# Patient Record
Sex: Male | Born: 1955 | Race: Black or African American | Hispanic: No | Marital: Married | Smoking: Never smoker
Health system: Southern US, Community
[De-identification: ages and names within clinical notes are randomized; demographics above are authoritative.]

## PROBLEM LIST (undated history)

## (undated) DIAGNOSIS — C119 Malignant neoplasm of nasopharynx, unspecified: Secondary | ICD-10-CM

## (undated) DIAGNOSIS — E785 Hyperlipidemia, unspecified: Secondary | ICD-10-CM

## (undated) DIAGNOSIS — N189 Chronic kidney disease, unspecified: Secondary | ICD-10-CM

## (undated) DIAGNOSIS — I10 Essential (primary) hypertension: Secondary | ICD-10-CM

## (undated) DIAGNOSIS — G4730 Sleep apnea, unspecified: Secondary | ICD-10-CM

## (undated) DIAGNOSIS — R079 Chest pain, unspecified: Secondary | ICD-10-CM

## (undated) DIAGNOSIS — Z87442 Personal history of urinary calculi: Secondary | ICD-10-CM

## (undated) DIAGNOSIS — J189 Pneumonia, unspecified organism: Secondary | ICD-10-CM

## (undated) HISTORY — DX: Hyperlipidemia, unspecified: E78.5

## (undated) HISTORY — PX: ROTATOR CUFF REPAIR: SHX139

## (undated) HISTORY — PX: HERNIA REPAIR: SHX51

## (undated) HISTORY — PX: FINE NEEDLE ASPIRATION BIOPSY: CATH118315

## (undated) HISTORY — DX: Chronic kidney disease, unspecified: N18.9

## (undated) HISTORY — PX: OTHER SURGICAL HISTORY: SHX169

---

## 2004-03-02 ENCOUNTER — Emergency Department (HOSPITAL_COMMUNITY): Admission: EM | Admit: 2004-03-02 | Discharge: 2004-03-02 | Payer: Self-pay | Admitting: Emergency Medicine

## 2004-06-24 ENCOUNTER — Ambulatory Visit (HOSPITAL_COMMUNITY): Admission: RE | Admit: 2004-06-24 | Discharge: 2004-06-24 | Payer: Self-pay | Admitting: Orthopaedic Surgery

## 2004-06-24 ENCOUNTER — Ambulatory Visit (HOSPITAL_BASED_OUTPATIENT_CLINIC_OR_DEPARTMENT_OTHER): Admission: RE | Admit: 2004-06-24 | Discharge: 2004-06-24 | Payer: Self-pay | Admitting: Orthopaedic Surgery

## 2005-09-22 ENCOUNTER — Observation Stay (HOSPITAL_COMMUNITY): Admission: EM | Admit: 2005-09-22 | Discharge: 2005-09-23 | Payer: Self-pay | Admitting: Emergency Medicine

## 2005-09-22 ENCOUNTER — Ambulatory Visit: Payer: Self-pay | Admitting: Internal Medicine

## 2005-09-24 ENCOUNTER — Ambulatory Visit: Payer: Self-pay

## 2006-03-20 ENCOUNTER — Emergency Department (HOSPITAL_COMMUNITY): Admission: EM | Admit: 2006-03-20 | Discharge: 2006-03-21 | Payer: Self-pay | Admitting: Emergency Medicine

## 2010-11-05 ENCOUNTER — Emergency Department (HOSPITAL_COMMUNITY)
Admission: EM | Admit: 2010-11-05 | Discharge: 2010-11-05 | Disposition: A | Discharge status: Home / Self Care | Payer: Worker's Compensation | Attending: Emergency Medicine | Admitting: Emergency Medicine

## 2010-11-05 DIAGNOSIS — Y92410 Unspecified street and highway as the place of occurrence of the external cause: Secondary | ICD-10-CM | POA: Insufficient documentation

## 2010-11-05 DIAGNOSIS — I10 Essential (primary) hypertension: Secondary | ICD-10-CM | POA: Insufficient documentation

## 2010-11-05 DIAGNOSIS — M545 Low back pain, unspecified: Secondary | ICD-10-CM | POA: Insufficient documentation

## 2010-11-05 DIAGNOSIS — E7800 Pure hypercholesterolemia, unspecified: Secondary | ICD-10-CM | POA: Insufficient documentation

## 2010-11-05 DIAGNOSIS — T1490XA Injury, unspecified, initial encounter: Secondary | ICD-10-CM | POA: Insufficient documentation

## 2011-12-04 ENCOUNTER — Telehealth: Payer: Self-pay

## 2011-12-04 NOTE — Telephone Encounter (Signed)
OPEN IN ERROR 

## 2015-09-09 ENCOUNTER — Other Ambulatory Visit: Payer: Self-pay | Admitting: Orthopedic Surgery

## 2015-09-09 ENCOUNTER — Ambulatory Visit
Admission: RE | Admit: 2015-09-09 | Discharge: 2015-09-09 | Disposition: A | Discharge status: Home / Self Care | Payer: Commercial Managed Care - HMO | Source: Ambulatory Visit | Attending: Orthopedic Surgery | Admitting: Orthopedic Surgery

## 2015-09-09 DIAGNOSIS — Z09 Encounter for follow-up examination after completed treatment for conditions other than malignant neoplasm: Secondary | ICD-10-CM

## 2015-11-18 ENCOUNTER — Encounter (HOSPITAL_COMMUNITY): Payer: Self-pay | Admitting: Emergency Medicine

## 2015-11-18 DIAGNOSIS — N23 Unspecified renal colic: Secondary | ICD-10-CM | POA: Diagnosis not present

## 2015-11-18 DIAGNOSIS — R300 Dysuria: Secondary | ICD-10-CM | POA: Insufficient documentation

## 2015-11-18 DIAGNOSIS — R112 Nausea with vomiting, unspecified: Secondary | ICD-10-CM | POA: Diagnosis not present

## 2015-11-18 DIAGNOSIS — R1032 Left lower quadrant pain: Secondary | ICD-10-CM | POA: Diagnosis present

## 2015-11-18 DIAGNOSIS — I10 Essential (primary) hypertension: Secondary | ICD-10-CM | POA: Diagnosis not present

## 2015-11-18 LAB — URINALYSIS, ROUTINE W REFLEX MICROSCOPIC
BILIRUBIN URINE: NEGATIVE
Glucose, UA: NEGATIVE mg/dL
KETONES UR: NEGATIVE mg/dL
NITRITE: NEGATIVE
PROTEIN: 30 mg/dL — AB
Specific Gravity, Urine: 1.022 (ref 1.005–1.030)
pH: 5.5 (ref 5.0–8.0)

## 2015-11-18 LAB — CBC
HEMATOCRIT: 48 % (ref 39.0–52.0)
Hemoglobin: 16 g/dL (ref 13.0–17.0)
MCH: 30.1 pg (ref 26.0–34.0)
MCHC: 33.3 g/dL (ref 30.0–36.0)
MCV: 90.2 fL (ref 78.0–100.0)
PLATELETS: 218 10*3/uL (ref 150–400)
RBC: 5.32 MIL/uL (ref 4.22–5.81)
RDW: 15.3 % (ref 11.5–15.5)
WBC: 8.6 10*3/uL (ref 4.0–10.5)

## 2015-11-18 LAB — COMPREHENSIVE METABOLIC PANEL
ALBUMIN: 4 g/dL (ref 3.5–5.0)
ALT: 24 U/L (ref 17–63)
AST: 20 U/L (ref 15–41)
Alkaline Phosphatase: 71 U/L (ref 38–126)
Anion gap: 8 (ref 5–15)
BILIRUBIN TOTAL: 1.2 mg/dL (ref 0.3–1.2)
BUN: 18 mg/dL (ref 6–20)
CALCIUM: 9.2 mg/dL (ref 8.9–10.3)
CO2: 25 mmol/L (ref 22–32)
Chloride: 103 mmol/L (ref 101–111)
Creatinine, Ser: 2.2 mg/dL — ABNORMAL HIGH (ref 0.61–1.24)
GFR calc Af Amer: 36 mL/min — ABNORMAL LOW (ref >60)
GFR, EST NON AFRICAN AMERICAN: 31 mL/min — AB (ref >60)
GLUCOSE: 134 mg/dL — AB (ref 65–99)
POTASSIUM: 3.6 mmol/L (ref 3.5–5.1)
Sodium: 136 mmol/L (ref 135–145)
TOTAL PROTEIN: 7.4 g/dL (ref 6.5–8.1)

## 2015-11-18 LAB — URINE MICROSCOPIC-ADD ON

## 2015-11-18 LAB — LIPASE, BLOOD: Lipase: 24 U/L (ref 11–51)

## 2015-11-18 NOTE — ED Notes (Signed)
Pt. reports LLQ pain with emesis onset 2 days ago , denies diarrhea , no fever or chills.

## 2015-11-19 ENCOUNTER — Emergency Department (HOSPITAL_COMMUNITY)
Admission: EM | Admit: 2015-11-19 | Discharge: 2015-11-19 | Disposition: A | Discharge status: Home / Self Care | Payer: Commercial Managed Care - HMO | Attending: Emergency Medicine | Admitting: Emergency Medicine

## 2015-11-19 ENCOUNTER — Emergency Department (HOSPITAL_COMMUNITY): Payer: Commercial Managed Care - HMO

## 2015-11-19 DIAGNOSIS — N23 Unspecified renal colic: Secondary | ICD-10-CM

## 2015-11-19 MED ORDER — HYDROCODONE-ACETAMINOPHEN 5-325 MG PO TABS: 1.0000 | ORAL_TABLET | ORAL | Status: DC | PRN

## 2015-11-19 MED ORDER — SODIUM CHLORIDE 0.9 % IV BOLUS (SEPSIS)
1000.0000 mL | Freq: Once | INTRAVENOUS | Status: AC
  Administered 2015-11-19: 1000 mL via INTRAVENOUS

## 2015-11-19 MED ORDER — TAMSULOSIN HCL 0.4 MG PO CAPS: 0.4000 mg | ORAL_CAPSULE | Freq: Every day | ORAL | Status: DC

## 2015-11-19 MED ORDER — HYDROCODONE-ACETAMINOPHEN 5-325 MG PO TABS
1.0000 | ORAL_TABLET | Freq: Once | ORAL | Status: AC
  Administered 2015-11-19: 1 via ORAL
  Filled 2015-11-19: qty 1

## 2015-11-19 MED ORDER — KETOROLAC TROMETHAMINE 15 MG/ML IJ SOLN
15.0000 mg | Freq: Once | INTRAMUSCULAR | Status: AC
  Administered 2015-11-19: 15 mg via INTRAVENOUS
  Filled 2015-11-19: qty 1

## 2015-11-19 NOTE — ED Notes (Signed)
MD at bedside. 

## 2015-11-19 NOTE — Discharge Instructions (Signed)
You have a 3 mm stone in your left ureter.  Drink plenty of fluids.  Get rechecked immediately if you develop fevers, uncontrolled pain, or new worrisome symptoms.     Kidney Stones Kidney stones (urolithiasis) are deposits that form inside your kidneys. The intense pain is caused by the stone moving through the urinary tract. When the stone moves, the ureter goes into spasm around the stone. The stone is usually passed in the urine.  CAUSES   A disorder that makes certain neck glands produce too much parathyroid hormone (primary hyperparathyroidism).  A buildup of uric acid crystals, similar to gout in your joints.  Narrowing (stricture) of the ureter.  A kidney obstruction present at birth (congenital obstruction).  Previous surgery on the kidney or ureters.  Numerous kidney infections. SYMPTOMS   Feeling sick to your stomach (nauseous).  Throwing up (vomiting).  Blood in the urine (hematuria).  Pain that usually spreads (radiates) to the groin.  Frequency or urgency of urination. DIAGNOSIS   Taking a history and physical exam.  Blood or urine tests.  CT scan.  Occasionally, an examination of the inside of the urinary bladder (cystoscopy) is performed. TREATMENT   Observation.  Increasing your fluid intake.  Extracorporeal shock wave lithotripsy--This is a noninvasive procedure that uses shock waves to break up kidney stones.  Surgery may be needed if you have severe pain or persistent obstruction. There are various surgical procedures. Most of the procedures are performed with the use of small instruments. Only small incisions are needed to accommodate these instruments, so recovery time is minimized. The size, location, and chemical composition are all important variables that will determine the proper choice of action for you. Talk to your health care provider to better understand your situation so that you will minimize the risk of injury to yourself and your  kidney.  HOME CARE INSTRUCTIONS   Drink enough water and fluids to keep your urine clear or pale yellow. This will help you to pass the stone or stone fragments.  Strain all urine through the provided strainer. Keep all particulate matter and stones for your health care provider to see. The stone causing the pain may be as small as a grain of salt. It is very important to use the strainer each and every time you pass your urine. The collection of your stone will allow your health care provider to analyze it and verify that a stone has actually passed. The stone analysis will often identify what you can do to reduce the incidence of recurrences.  Only take over-the-counter or prescription medicines for pain, discomfort, or fever as directed by your health care provider.  Keep all follow-up visits as told by your health care provider. This is important.  Get follow-up X-rays if required. The absence of pain does not always mean that the stone has passed. It may have only stopped moving. If the urine remains completely obstructed, it can cause loss of kidney function or even complete destruction of the kidney. It is your responsibility to make sure X-rays and follow-ups are completed. Ultrasounds of the kidney can show blockages and the status of the kidney. Ultrasounds are not associated with any radiation and can be performed easily in a matter of minutes.  Make changes to your daily diet as told by your health care provider. You may be told to:  Limit the amount of salt that you eat.  Eat 5 or more servings of fruits and vegetables each day.  Limit  the amount of meat, poultry, fish, and eggs that you eat.  Collect a 24-hour urine sample as told by your health care provider.You may need to collect another urine sample every 6-12 months. SEEK MEDICAL CARE IF:  You experience pain that is progressive and unresponsive to any pain medicine you have been prescribed. SEEK IMMEDIATE MEDICAL CARE  IF:   Pain cannot be controlled with the prescribed medicine.  You have a fever or shaking chills.  The severity or intensity of pain increases over 18 hours and is not relieved by pain medicine.  You develop a new onset of abdominal pain.  You feel faint or pass out.  You are unable to urinate.   This information is not intended to replace advice given to you by your health care provider. Make sure you discuss any questions you have with your health care provider.   Document Released: 06/22/2005 Document Revised: 03/13/2015 Document Reviewed: 11/23/2012 Elsevier Interactive Patient Education [employer].

## 2015-11-19 NOTE — ED Provider Notes (Signed)
CSN: [id number]     Arrival date & time 11/18/15  2115 History  By signing my name below, I, Julien Nordmann, attest that this documentation has been prepared under the direction and in the presence of Quintella Reichert, MD. Electronically Signed: Julien Nordmann, ED Scribe. 11/19/2015. 1:15 AM.    Chief Complaint  Patient presents with  . Abdominal Pain     The history is provided by the patient. No language interpreter was used.   HPI Comments: Alan Henry is a 60 y.o. male who has a PMHx of HTN and overactive bladder presents to the Emergency Department complaining of constant, gradual worsening, moderate, throbbing LLQ abdominal pain onset two days ago. Pt has been having associated dysuria, nausea, loss of appetite and one episode of vomiting. He says he has had similar pain two weeks ago and was seen by his PCP where he was given a Toradol shot to alleviate his pain with relief. Pt reports severe pain with any kind of movement. He has taken Tylenol to alleviate his pain with no relief. He reports having a hx of kidney infections and he notes this current pain feels similar but is giving him more pain. Pt denies fever, hx of kidney stones, and diarrhea.    PCP: Dr. Jeanie Cooks History reviewed. No pertinent past medical history. History reviewed. No pertinent past surgical history. No family history on file. Social History  Substance Use Topics  . Smoking status: Never Smoker   . Smokeless tobacco: None  . Alcohol Use: No    Review of Systems  Constitutional: Positive for appetite change. Negative for fever.  Gastrointestinal: Positive for nausea, vomiting and abdominal pain (LLQ). Negative for diarrhea.  Genitourinary: Positive for dysuria.  All other systems reviewed and are negative.     Allergies  Review of patient's allergies indicates no known allergies.  Home Medications   Prior to Admission medications   Medication Sig Start Date End Date Taking? Authorizing Provider   HYDROcodone-acetaminophen (NORCO/VICODIN) 5-325 MG tablet Take 1 tablet by mouth every 4 (four) hours as needed. 11/19/15   Quintella Reichert, MD  tamsulosin (FLOMAX) 0.4 MG CAPS capsule Take 1 capsule (0.4 mg total) by mouth daily. 11/19/15   Quintella Reichert, MD   Triage vitals: BP 165/106 mmHg  Pulse 59  Temp(Src) 99.4 F (37.4 C) (Oral)  Resp 16  SpO2 99% Physical Exam  Constitutional: He is oriented to person, place, and time. He appears well-developed and well-nourished.  HENT:  Head: Normocephalic and atraumatic.  Cardiovascular: Normal rate and regular rhythm.   No murmur heard. Pulmonary/Chest: Effort normal and breath sounds normal. No respiratory distress.  Abdominal: Soft. There is tenderness. There is no rebound and no guarding.  Mild diffuse abdominal tenderness with mild LLQ tenderness  Musculoskeletal: He exhibits no edema or tenderness.  Neurological: He is alert and oriented to person, place, and time.  Skin: Skin is warm and dry.  Psychiatric: He has a normal mood and affect. His behavior is normal.  Nursing note and vitals reviewed.   ED Course  Procedures  DIAGNOSTIC STUDIES: Oxygen Saturation is 99% on RA, normal by my interpretation.  COORDINATION OF CARE:  1:13 AM Will order CT of abdomen. Discussed treatment plan which includes IV fluids and pain medication with pt at bedside and pt agreed to plan.  2:34 AM Discussed with the urologist on call. Pt will follow up with them in the future.    Labs Review Labs Reviewed  COMPREHENSIVE METABOLIC PANEL - Abnormal;  Notable for the following:    Glucose, Bld 134 (*)    Creatinine, Ser 2.20 (*)    GFR calc non Af Amer 31 (*)    GFR calc Af Amer 36 (*)    All other components within normal limits  URINALYSIS, ROUTINE W REFLEX MICROSCOPIC (NOT AT [facility]) - Abnormal; Notable for the following:    APPearance CLOUDY (*)    Hgb urine dipstick LARGE (*)    Protein, ur 30 (*)    Leukocytes, UA SMALL (*)    All other  components within normal limits  URINE MICROSCOPIC-ADD ON - Abnormal; Notable for the following:    Squamous Epithelial / LPF 0-5 (*)    Bacteria, UA RARE (*)    All other components within normal limits  URINE CULTURE  LIPASE, BLOOD  CBC    Imaging Review Ct Renal Stone Study  11/19/2015  CLINICAL DATA:  Left lower quadrant pain, nausea, vomiting, and constipation for 2 days. EXAM: CT ABDOMEN AND PELVIS WITHOUT CONTRAST TECHNIQUE: Multidetector CT imaging of the abdomen and pelvis was performed following the standard protocol without IV contrast. COMPARISON:  None. FINDINGS: In the lung bases are clear. There is a tree mm stone in the mid left ureter at the level of L3-4. There is proximal ureterectasis and hydronephrosis on the left with enlargement of the left kidney and stranding around the left kidney and ureter. The distal ureter is decompressed. Additional intrarenal stones are demonstrated in both kidneys. Stone in the right mid pole measures 5 mm diameter. No hydronephrosis or hydroureter on the right. No right ureteral stones demonstrated. Bladder wall is not thickened and no bladder stones are visualized. The unenhanced appearance of the liver, spleen, gallbladder, pancreas, adrenal glands, abdominal aorta, inferior vena cava, and retroperitoneal lymph nodes is unremarkable. Stomach and small bowel are mostly decompressed. Scattered stool in the colon without abnormal colonic distention. No free air or free fluid in the abdomen. Pelvis: The appendix is normal. Prostate gland is not enlarged. No free or loculated pelvic fluid collections. No pelvic mass or lymphadenopathy. Mild degenerative changes in the spine. No destructive bone lesions. IMPRESSION: 3 mm stone in the mid left ureter with moderate proximal obstruction. Additional nonobstructing intrarenal stones bilaterally. Electronically Signed   By: Lucienne Capers M.D.   On: 11/19/2015 02:04   I have personally reviewed and evaluated  these images and lab results as part of my medical decision-making.   EKG Interpretation None      MDM   Final diagnoses:  Renal colic on left side   Patient here for evaluation of abdominal pain. He is tender on examination in the left lower quadrant. BMP with renal insufficiency of unclear duration. The patient has no known history of renal insufficiency but there are no old labs available for review. CT scan demonstrates a 3 mm stone in the left mid ureter. UA is not consistent with infection, cultures sent. Patient's pain and significantly improved in the emergency department. Plan to DC home with close urology follow-up.  I personally performed the services described in this documentation, which was scribed in my presence. The recorded information has been reviewed and is accurate.   Quintella Reichert, MD 11/19/15 2230

## 2015-11-20 ENCOUNTER — Emergency Department (HOSPITAL_COMMUNITY): Payer: Commercial Managed Care - HMO

## 2015-11-20 ENCOUNTER — Emergency Department (HOSPITAL_COMMUNITY)
Admission: EM | Admit: 2015-11-20 | Discharge: 2015-11-20 | Disposition: A | Discharge status: Home / Self Care | Payer: Commercial Managed Care - HMO | Attending: Emergency Medicine | Admitting: Emergency Medicine

## 2015-11-20 ENCOUNTER — Encounter (HOSPITAL_COMMUNITY): Payer: Self-pay | Admitting: Emergency Medicine

## 2015-11-20 DIAGNOSIS — N289 Disorder of kidney and ureter, unspecified: Secondary | ICD-10-CM | POA: Diagnosis not present

## 2015-11-20 DIAGNOSIS — N23 Unspecified renal colic: Secondary | ICD-10-CM | POA: Diagnosis not present

## 2015-11-20 DIAGNOSIS — R109 Unspecified abdominal pain: Secondary | ICD-10-CM | POA: Diagnosis present

## 2015-11-20 DIAGNOSIS — Z7982 Long term (current) use of aspirin: Secondary | ICD-10-CM | POA: Insufficient documentation

## 2015-11-20 DIAGNOSIS — Z79899 Other long term (current) drug therapy: Secondary | ICD-10-CM | POA: Insufficient documentation

## 2015-11-20 LAB — CBC
HCT: 45.5 % (ref 39.0–52.0)
Hemoglobin: 15.1 g/dL (ref 13.0–17.0)
MCH: 30.2 pg (ref 26.0–34.0)
MCHC: 33.2 g/dL (ref 30.0–36.0)
MCV: 91 fL (ref 78.0–100.0)
PLATELETS: 246 10*3/uL (ref 150–400)
RBC: 5 MIL/uL (ref 4.22–5.81)
RDW: 15 % (ref 11.5–15.5)
WBC: 5.8 10*3/uL (ref 4.0–10.5)

## 2015-11-20 LAB — URINALYSIS, ROUTINE W REFLEX MICROSCOPIC
BILIRUBIN URINE: NEGATIVE
Glucose, UA: NEGATIVE mg/dL
Ketones, ur: NEGATIVE mg/dL
Leukocytes, UA: NEGATIVE
Nitrite: NEGATIVE
PROTEIN: 30 mg/dL — AB
Specific Gravity, Urine: 1.023 (ref 1.005–1.030)
pH: 6 (ref 5.0–8.0)

## 2015-11-20 LAB — COMPREHENSIVE METABOLIC PANEL
ALK PHOS: 65 U/L (ref 38–126)
ALT: 25 U/L (ref 17–63)
AST: 19 U/L (ref 15–41)
Albumin: 3.9 g/dL (ref 3.5–5.0)
Anion gap: 11 (ref 5–15)
BILIRUBIN TOTAL: 1.2 mg/dL (ref 0.3–1.2)
BUN: 20 mg/dL (ref 6–20)
CALCIUM: 9.1 mg/dL (ref 8.9–10.3)
CO2: 25 mmol/L (ref 22–32)
CREATININE: 2.36 mg/dL — AB (ref 0.61–1.24)
Chloride: 104 mmol/L (ref 101–111)
GFR, EST AFRICAN AMERICAN: 34 mL/min — AB (ref >60)
GFR, EST NON AFRICAN AMERICAN: 29 mL/min — AB (ref >60)
Glucose, Bld: 139 mg/dL — ABNORMAL HIGH (ref 65–99)
Potassium: 4.2 mmol/L (ref 3.5–5.1)
SODIUM: 140 mmol/L (ref 135–145)
Total Protein: 7.2 g/dL (ref 6.5–8.1)

## 2015-11-20 LAB — URINE MICROSCOPIC-ADD ON: SQUAMOUS EPITHELIAL / LPF: NONE SEEN

## 2015-11-20 LAB — URINE CULTURE: Culture: 9000 — AB

## 2015-11-20 LAB — LIPASE, BLOOD: Lipase: 22 U/L (ref 11–51)

## 2015-11-20 MED ORDER — HYDROMORPHONE HCL 1 MG/ML IJ SOLN
1.0000 mg | Freq: Once | INTRAMUSCULAR | Status: AC
  Administered 2015-11-20: 1 mg via INTRAVENOUS
  Filled 2015-11-20: qty 1

## 2015-11-20 MED ORDER — OXYCODONE-ACETAMINOPHEN 5-325 MG PO TABS: 1.0000 | ORAL_TABLET | ORAL | Status: DC | PRN

## 2015-11-20 MED ORDER — ONDANSETRON HCL 4 MG/2ML IJ SOLN
4.0000 mg | Freq: Once | INTRAMUSCULAR | Status: AC
  Administered 2015-11-20: 4 mg via INTRAVENOUS
  Filled 2015-11-20: qty 2

## 2015-11-20 MED ORDER — KETOROLAC TROMETHAMINE 30 MG/ML IJ SOLN: 30.0000 mg | Freq: Once | INTRAMUSCULAR | Status: DC

## 2015-11-20 MED ORDER — ONDANSETRON HCL 4 MG PO TABS: 4.0000 mg | ORAL_TABLET | Freq: Three times a day (TID) | ORAL | Status: DC | PRN

## 2015-11-20 MED ORDER — HYDROMORPHONE HCL 1 MG/ML IJ SOLN
1.0000 mg | INTRAMUSCULAR | Status: DC | PRN
  Administered 2015-11-20 (×2): 1 mg via INTRAVENOUS
  Filled 2015-11-20 (×2): qty 1

## 2015-11-20 NOTE — Discharge Instructions (Signed)
Read the information below.  Use the prescribed medication as directed.  Please discuss all new medications with your pharmacist.  Do not take additional tylenol while taking the prescribed pain medication to avoid overdose.  Do not take the previously prescribed pain medication and the new one at the same time.  You may return to the Emergency Department at any time for worsening condition or any new symptoms that concern you.  If you develop high fevers, worsening abdominal pain, uncontrolled vomiting, or are unable to tolerate fluids by mouth, return to the ER for a recheck.     Kidney Stones Kidney stones (urolithiasis) are deposits that form inside your kidneys. The intense pain is caused by the stone moving through the urinary tract. When the stone moves, the ureter goes into spasm around the stone. The stone is usually passed in the urine.  CAUSES   A disorder that makes certain neck glands produce too much parathyroid hormone (primary hyperparathyroidism).  A buildup of uric acid crystals, similar to gout in your joints.  Narrowing (stricture) of the ureter.  A kidney obstruction present at birth (congenital obstruction).  Previous surgery on the kidney or ureters.  Numerous kidney infections. SYMPTOMS   Feeling sick to your stomach (nauseous).  Throwing up (vomiting).  Blood in the urine (hematuria).  Pain that usually spreads (radiates) to the groin.  Frequency or urgency of urination. DIAGNOSIS   Taking a history and physical exam.  Blood or urine tests.  CT scan.  Occasionally, an examination of the inside of the urinary bladder (cystoscopy) is performed. TREATMENT   Observation.  Increasing your fluid intake.  Extracorporeal shock wave lithotripsy--This is a noninvasive procedure that uses shock waves to break up kidney stones.  Surgery may be needed if you have severe pain or persistent obstruction. There are various surgical procedures. Most of the  procedures are performed with the use of small instruments. Only small incisions are needed to accommodate these instruments, so recovery time is minimized. The size, location, and chemical composition are all important variables that will determine the proper choice of action for you. Talk to your health care provider to better understand your situation so that you will minimize the risk of injury to yourself and your kidney.  HOME CARE INSTRUCTIONS   Drink enough water and fluids to keep your urine clear or pale yellow. This will help you to pass the stone or stone fragments.  Strain all urine through the provided strainer. Keep all particulate matter and stones for your health care provider to see. The stone causing the pain may be as small as a grain of salt. It is very important to use the strainer each and every time you pass your urine. The collection of your stone will allow your health care provider to analyze it and verify that a stone has actually passed. The stone analysis will often identify what you can do to reduce the incidence of recurrences.  Only take over-the-counter or prescription medicines for pain, discomfort, or fever as directed by your health care provider.  Keep all follow-up visits as told by your health care provider. This is important.  Get follow-up X-rays if required. The absence of pain does not always mean that the stone has passed. It may have only stopped moving. If the urine remains completely obstructed, it can cause loss of kidney function or even complete destruction of the kidney. It is your responsibility to make sure X-rays and follow-ups are completed. Ultrasounds of  the kidney can show blockages and the status of the kidney. Ultrasounds are not associated with any radiation and can be performed easily in a matter of minutes.  Make changes to your daily diet as told by your health care provider. You may be told to:  Limit the amount of salt that you  eat.  Eat 5 or more servings of fruits and vegetables each day.  Limit the amount of meat, poultry, fish, and eggs that you eat.  Collect a 24-hour urine sample as told by your health care provider.You may need to collect another urine sample every 6-12 months. SEEK MEDICAL CARE IF:  You experience pain that is progressive and unresponsive to any pain medicine you have been prescribed. SEEK IMMEDIATE MEDICAL CARE IF:   Pain cannot be controlled with the prescribed medicine.  You have a fever or shaking chills.  The severity or intensity of pain increases over 18 hours and is not relieved by pain medicine.  You develop a new onset of abdominal pain.  You feel faint or pass out.  You are unable to urinate.   This information is not intended to replace advice given to you by your health care provider. Make sure you discuss any questions you have with your health care provider.   Document Released: 06/22/2005 Document Revised: 03/13/2015 Document Reviewed: 11/23/2012 Elsevier Interactive Patient Education [employer].

## 2015-11-20 NOTE — ED Provider Notes (Signed)
Attempted to see pt but he was not in the room  Alan Schmidt, MD 11/20/15 1607

## 2015-11-20 NOTE — ED Provider Notes (Signed)
Hand-off from [place], [place].  Briefly, patient is a 60 year old male who presents to the ED with complaints of worsening left flank pain that radiates around to his left abdomen. Patient was seen in the ED 2 days ago, diagnosed with 3 mm left ureteral stone and discharged home with pain meds. Patient reports he has been taking one Norco every 4 hours as directed without relief. Denies fever, chills, nausea, vomiting, testicular pain.  Physical Exam  BP 169/105 mmHg  Pulse 98  Temp(Src) 99 F (37.2 C) (Oral)  Resp 16  SpO2 96%  Physical Exam  Constitutional: He is oriented to person, place, and time. He appears well-developed and well-nourished.  HENT:  Head: Normocephalic and atraumatic.  Mouth/Throat: Oropharynx is clear and moist. No oropharyngeal exudate.  Eyes: Conjunctivae and EOM are normal. Right eye exhibits no discharge. Left eye exhibits no discharge. No scleral icterus.  Neck: Normal range of motion. Neck supple.  Cardiovascular: Normal rate, regular rhythm, normal heart sounds and intact distal pulses.   Pulmonary/Chest: Effort normal and breath sounds normal. No respiratory distress. He has no wheezes. He has no rales. He exhibits no tenderness.  Abdominal: Soft. Bowel sounds are normal. He exhibits no distension and no mass. There is tenderness (mild tenderness in LLQ). There is no rebound and no guarding.  Left CVA tenderness.  Musculoskeletal: He exhibits no edema.  Neurological: He is alert and oriented to person, place, and time.  Skin: Skin is warm and dry.  Nursing note and vitals reviewed.   ED Course  Procedures  MDM  Patient diagnosed with 3 mm left ureteral stone 2 days ago, pain uncontrolled at home. Patient was seen by initial provider and given pain meds and antiemetics. Creatinine 2.36, which is unchanged from his creatinine level 2 days ago. Baseline value unknown. Remaining labs unremarkable. Renal ultrasound revealed minimal asymmetric left renal  pelvicaliectasis without significant change compared to recent CT. Patient's pain tolerated with IV Dilaudid. UA pending at handoff. Urine culture from initial visit only grew 9,000 culture. Plan to discharge patient home if UA negative with increase pain medication, urology and nephrology follow-up.  On my initial valuation patient reports his pain has improved after IV Dilaudid. He endorses having mild nausea, patient given second dose of IV Zofran. Pt able to tolerate PO in the ED. UA revealed no signs of infection, urine culture sent. Plan to d/c pt home with increased pain meds and zofran. Discussed results and plan for discharge with patient. Patient reports he has an appointment scheduled with urology tomorrow afternoon. I advised patient to follow up with nephrology regarding his finding of elevated Cr for further evaluation. Discussed strict return precautions with pt. Pt reports understanding and agreement.         Chesley Noon [place], [place] 11/20/15 2237  Ripley Fraise, MD 11/21/15 [phone]

## 2015-11-20 NOTE — ED Provider Notes (Signed)
CSN: [id number]     Arrival date & time 11/20/15  1422 History   First MD Initiated Contact with Patient 11/20/15 1547     Chief Complaint  Patient presents with  . Abdominal Pain     (Consider location/radiation/quality/duration/timing/severity/associated sxs/prior Treatment) HPI   Pt with known left ureteral stone, 25mm, seen in ED 2 days ago p/w uncontrolled pain.  States he has been taking 1 norco Q 4 hours as directed without relief.  The pain initially began 3 days ago, diffuse sharp left flank pain and burning pain with urination.  This is his first kidney stone.  Has not had bowel movement in 4 days but is passing flatus.  Denies fevers, N/V, testicular pain. Has been eating and drinking well.    PCP Dr Jeanie Cooks   No past medical history on file. History reviewed. No pertinent past surgical history. No family history on file. Social History  Substance Use Topics  . Smoking status: Never Smoker   . Smokeless tobacco: None  . Alcohol Use: No    Review of Systems  All other systems reviewed and are negative.     Allergies  Review of patient's allergies indicates no known allergies.  Home Medications   Prior to Admission medications   Medication Sig Start Date End Date Taking? Authorizing Provider  amLODipine (NORVASC) 5 MG tablet Take 5 mg by mouth daily.   Yes Historical Provider, MD  aspirin 81 MG tablet Take 81 mg by mouth daily.   Yes Historical Provider, MD  atorvastatin (LIPITOR) 80 MG tablet Take 80 mg by mouth daily.   Yes Historical Provider, MD  cetirizine (ZYRTEC) 10 MG tablet Take 10 mg by mouth daily as needed for allergies.   Yes Historical Provider, MD  cyclobenzaprine (FLEXERIL) 10 MG tablet Take 10 mg by mouth 3 (three) times daily as needed for muscle spasms.   Yes Historical Provider, MD  diclofenac (VOLTAREN) 75 MG EC tablet Take 75 mg by mouth 2 (two) times daily as needed for mild pain.   Yes Historical Provider, MD  ergocalciferol (VITAMIN D2)  50000 units capsule Take 50,000 Units by mouth once a week. Take on Mondays   Yes Historical Provider, MD  HYDROcodone-acetaminophen (NORCO/VICODIN) 5-325 MG tablet Take 1 tablet by mouth every 4 (four) hours as needed. 11/19/15  Yes Quintella Reichert, MD  lisinopril (PRINIVIL,ZESTRIL) 20 MG tablet Take 20 mg by mouth daily.   Yes Historical Provider, MD  tamsulosin (FLOMAX) 0.4 MG CAPS capsule Take 1 capsule (0.4 mg total) by mouth daily. 11/19/15  Yes Quintella Reichert, MD   BP 169/105 mmHg  Pulse 98  Temp(Src) 99 F (37.2 C) (Oral)  Resp 16  SpO2 96% Physical Exam  Constitutional: He appears well-developed and well-nourished. No distress.  HENT:  Head: Normocephalic and atraumatic.  Neck: Neck supple.  Cardiovascular: Normal rate and regular rhythm.   Pulmonary/Chest: Effort normal and breath sounds normal. No respiratory distress. He has no wheezes. He has no rales.  Abdominal: Soft. He exhibits no distension and no mass. There is tenderness. There is no rebound and no guarding.  Diffuse tenderness throughout left abdomen and left flank   Neurological: He is alert. He exhibits normal muscle tone.  Skin: He is not diaphoretic.  Nursing note and vitals reviewed.   ED Course  Procedures (including critical care time) Labs Review Labs Reviewed  COMPREHENSIVE METABOLIC PANEL - Abnormal; Notable for the following:    Glucose, Bld 139 (*)    Creatinine,  Ser 2.36 (*)    GFR calc non Af Amer 29 (*)    GFR calc Af Amer 34 (*)    All other components within normal limits  LIPASE, BLOOD  CBC  URINALYSIS, ROUTINE W REFLEX MICROSCOPIC (NOT AT [facility])    Imaging Review US Renal  11/20/2015  CLINICAL DATA:  Followup left ureteral calculus and mild hydronephrosis seen on recent CT. EXAM: RENAL / URINARY TRACT ULTRASOUND COMPLETE COMPARISON:  CT on 11/19/2015 FINDINGS: Right Kidney: Length: 11.2 cm. Echogenicity within normal limits. No mass or hydronephrosis visualized. Left Kidney: Length: 12.6  cm. Echogenicity within normal limits. No mass identified. Minimal asymmetric left sided pelvicaliectasis noted. Bladder: Appears normal for degree of bladder distention. IMPRESSION: Minimal asymmetric left renal pelvicaliectasis, without significant change compared to recent CT. Electronically Signed   By: Earle Gell M.D.   On: 11/20/2015 18:22   Ct Renal Stone Study  11/19/2015  CLINICAL DATA:  Left lower quadrant pain, nausea, vomiting, and constipation for 2 days. EXAM: CT ABDOMEN AND PELVIS WITHOUT CONTRAST TECHNIQUE: Multidetector CT imaging of the abdomen and pelvis was performed following the standard protocol without IV contrast. COMPARISON:  None. FINDINGS: In the lung bases are clear. There is a tree mm stone in the mid left ureter at the level of L3-4. There is proximal ureterectasis and hydronephrosis on the left with enlargement of the left kidney and stranding around the left kidney and ureter. The distal ureter is decompressed. Additional intrarenal stones are demonstrated in both kidneys. Stone in the right mid pole measures 5 mm diameter. No hydronephrosis or hydroureter on the right. No right ureteral stones demonstrated. Bladder wall is not thickened and no bladder stones are visualized. The unenhanced appearance of the liver, spleen, gallbladder, pancreas, adrenal glands, abdominal aorta, inferior vena cava, and retroperitoneal lymph nodes is unremarkable. Stomach and small bowel are mostly decompressed. Scattered stool in the colon without abnormal colonic distention. No free air or free fluid in the abdomen. Pelvis: The appendix is normal. Prostate gland is not enlarged. No free or loculated pelvic fluid collections. No pelvic mass or lymphadenopathy. Mild degenerative changes in the spine. No destructive bone lesions. IMPRESSION: 3 mm stone in the mid left ureter with moderate proximal obstruction. Additional nonobstructing intrarenal stones bilaterally. Electronically Signed   By:  Lucienne Capers M.D.   On: 11/19/2015 02:04   I have personally reviewed and evaluated these images and lab results as part of my medical decision-making.   EKG Interpretation None      MDM   Final diagnoses:  Ureteral colic  Renal insufficiency   Afebrile nontoxic patient with left ureteral colic from 60mm stone, presenting with uncontrolled pain.  Pain easily controlled with IV dilaudid in ED.  Has been taking 1 norco Q4.  I suspect given his large build and weight with painful stone may need higher dose of pain medication.  Pt noted to have poor kidney function, not significantly different from ED visit earlier this week, unclear of his baseline.  Pt also does not know this.  I suspect this is chronic.  Urine culture from previous visit grew only 9,00 cultures.  UA pending at change of shift.  I suspect UA will be negative for infection and pt will d/c home with increase in pain medication, urology and nephrology follow up.  Pt signed out to Harlene Ramus, PA-C, at change of shift pending UA and reevaluation for symptom control.     Clayton Bibles, PA-C 11/20/15 2042  Ripley Fraise, MD 11/21/15 [phone]

## 2015-11-20 NOTE — ED Notes (Signed)
Pt drinking water w/o difficulty. Attempting to provide urine sample

## 2015-11-20 NOTE — ED Notes (Addendum)
Pt reports recent diagnosis of kidney stones on the left. Pt reports the pain medicine is not controlling the pain. Pt alert x4. NAD at this time. Pt reports that the blood work showed renal issues last time.

## 2015-11-22 LAB — URINE CULTURE

## 2016-10-03 ENCOUNTER — Encounter (HOSPITAL_COMMUNITY): Payer: Self-pay | Admitting: Emergency Medicine

## 2016-10-03 ENCOUNTER — Emergency Department (HOSPITAL_COMMUNITY)
Admission: EM | Admit: 2016-10-03 | Discharge: 2016-10-03 | Disposition: A | Discharge status: Home / Self Care | Payer: Commercial Managed Care - HMO | Attending: Emergency Medicine | Admitting: Emergency Medicine

## 2016-10-03 ENCOUNTER — Emergency Department (HOSPITAL_COMMUNITY): Payer: Commercial Managed Care - HMO

## 2016-10-03 DIAGNOSIS — Z7982 Long term (current) use of aspirin: Secondary | ICD-10-CM | POA: Diagnosis not present

## 2016-10-03 DIAGNOSIS — J181 Lobar pneumonia, unspecified organism: Secondary | ICD-10-CM | POA: Insufficient documentation

## 2016-10-03 DIAGNOSIS — R0602 Shortness of breath: Secondary | ICD-10-CM | POA: Diagnosis present

## 2016-10-03 DIAGNOSIS — J189 Pneumonia, unspecified organism: Secondary | ICD-10-CM

## 2016-10-03 MED ORDER — ALBUTEROL SULFATE (2.5 MG/3ML) 0.083% IN NEBU
5.0000 mg | INHALATION_SOLUTION | Freq: Once | RESPIRATORY_TRACT | Status: AC
  Administered 2016-10-03: 5 mg via RESPIRATORY_TRACT
  Filled 2016-10-03: qty 6

## 2016-10-03 MED ORDER — BENZONATATE 100 MG PO CAPS
200.0000 mg | ORAL_CAPSULE | Freq: Once | ORAL | Status: AC
  Administered 2016-10-03: 200 mg via ORAL
  Filled 2016-10-03: qty 2

## 2016-10-03 MED ORDER — IBUPROFEN 400 MG PO TABS
600.0000 mg | ORAL_TABLET | Freq: Once | ORAL | Status: AC
  Administered 2016-10-03: 600 mg via ORAL
  Filled 2016-10-03: qty 1

## 2016-10-03 MED ORDER — AZITHROMYCIN 250 MG PO TABS
500.0000 mg | ORAL_TABLET | Freq: Once | ORAL | Status: AC
  Administered 2016-10-03: 500 mg via ORAL
  Filled 2016-10-03: qty 2

## 2016-10-03 MED ORDER — AZITHROMYCIN 250 MG PO TABS: 250.0000 mg | ORAL_TABLET | Freq: Every day | ORAL | 0 refills | Status: DC

## 2016-10-03 NOTE — ED Provider Notes (Signed)
[place] DEPT Provider Note   CSN: [id number] Arrival date & time: 10/03/16  0419     History   Chief Complaint Chief Complaint  Patient presents with  . Shortness of Breath    HPI Alan Henry is a 61 y.o. male with no significant past medical history presenting today with 1 week of respiratory symptoms. Patient states she's had worsening shortness of breath, particularly when he lays down at night. He's having substernal chest pain which is worse when he coughs or when he takes a deep breath. He has had sick contacts at work. He denies any runny nose, congestion symptoms. He is not taking anything for this. There are no further complaints.  10 Systems reviewed and are negative for acute change except as noted in the HPI.   HPI  History reviewed. No pertinent past medical history.  There are no active problems to display for this patient.   Past Surgical History:  Procedure Laterality Date  . HERNIA REPAIR    . ROTATOR CUFF REPAIR    . Torn Labrum         Home Medications    Prior to Admission medications   Medication Sig Start Date End Date Taking? Authorizing Provider  amLODipine (NORVASC) 5 MG tablet Take 5 mg by mouth daily.    Historical Provider, MD  aspirin 81 MG tablet Take 81 mg by mouth daily.    Historical Provider, MD  atorvastatin (LIPITOR) 80 MG tablet Take 80 mg by mouth daily.    Historical Provider, MD  cetirizine (ZYRTEC) 10 MG tablet Take 10 mg by mouth daily as needed for allergies.    Historical Provider, MD  cyclobenzaprine (FLEXERIL) 10 MG tablet Take 10 mg by mouth 3 (three) times daily as needed for muscle spasms.    Historical Provider, MD  diclofenac (VOLTAREN) 75 MG EC tablet Take 75 mg by mouth 2 (two) times daily as needed for mild pain.    Historical Provider, MD  ergocalciferol (VITAMIN D2) 50000 units capsule Take 50,000 Units by mouth once a week. Take on Mondays    Historical Provider, MD  HYDROcodone-acetaminophen  (NORCO/VICODIN) 5-325 MG tablet Take 1 tablet by mouth every 4 (four) hours as needed. 11/19/15   Quintella Reichert, MD  lisinopril (PRINIVIL,ZESTRIL) 20 MG tablet Take 20 mg by mouth daily.    Historical Provider, MD  ondansetron (ZOFRAN) 4 MG tablet Take 1 tablet (4 mg total) by mouth every 8 (eight) hours as needed for nausea or vomiting. 11/20/15   Clayton Bibles, PA-C  oxyCODONE-acetaminophen (PERCOCET/ROXICET) 5-325 MG tablet Take 1-2 tablets by mouth every 4 (four) hours as needed for moderate pain or severe pain. 11/20/15   Clayton Bibles, PA-C  tamsulosin (FLOMAX) 0.4 MG CAPS capsule Take 1 capsule (0.4 mg total) by mouth daily. 11/19/15   Quintella Reichert, MD    Family History No family history on file.  Social History Social History  Substance Use Topics  . Smoking status: Never Smoker  . Smokeless tobacco: Never Used  . Alcohol use No     Allergies   Patient has no known allergies.   Review of Systems Review of Systems   Physical Exam Updated Vital Signs BP 134/78 (BP Location: Right Arm)   Pulse 94   Temp 99.1 F (37.3 C) (Oral)   Resp 20   Ht 5\' 8"  (1.727 m)   Wt 185 lb (83.9 kg)   SpO2 100%   BMI 28.13 kg/m   Physical Exam  Constitutional:  He is oriented to person, place, and time. Vital signs are normal. He appears well-developed and well-nourished.  Non-toxic appearance. He does not appear ill. No distress.  HENT:  Head: Normocephalic and atraumatic.  Nose: Nose normal.  Mouth/Throat: Oropharynx is clear and moist. No oropharyngeal exudate.  Eyes: Conjunctivae and EOM are normal. Pupils are equal, round, and reactive to light. No scleral icterus.  Neck: Normal range of motion. Neck supple. No tracheal deviation, no edema, no erythema and normal range of motion present. No thyroid mass and no thyromegaly present.  Cardiovascular: Normal rate, regular rhythm, S1 normal, S2 normal, normal heart sounds, intact distal pulses and normal pulses.  Exam reveals no gallop and no  friction rub.   No murmur heard. Pulmonary/Chest: Effort normal. No respiratory distress. He has wheezes. He has no rhonchi. He has no rales.  Intermittent mild wheezing heard  Abdominal: Soft. Normal appearance and bowel sounds are normal. He exhibits no distension, no ascites and no mass. There is no hepatosplenomegaly. There is no tenderness. There is no rebound, no guarding and no CVA tenderness.  Musculoskeletal: Normal range of motion. He exhibits no edema or tenderness.  Lymphadenopathy:    He has no cervical adenopathy.  Neurological: He is alert and oriented to person, place, and time. He has normal strength. No cranial nerve deficit or sensory deficit.  Skin: Skin is warm, dry and intact. No petechiae and no rash noted. He is not diaphoretic. No erythema. No pallor.  Nursing note and vitals reviewed.    ED Treatments / Results  Labs (all labs ordered are listed, but only abnormal results are displayed) Labs Reviewed - No data to display  EKG  EKG Interpretation  Date/Time:  Saturday October 03 2016 04:25:11 EDT Ventricular Rate:  94 PR Interval:  144 QRS Duration: 82 QT Interval:  348 QTC Calculation: 435 R Axis:   56 Text Interpretation:  Normal sinus rhythm T wave abnormality, consider inferolateral ischemia Abnormal ECG TWI are old but worsened Confirmed by Glynn Octave [phone]) on 10/03/2016 4:29:24 AM       Radiology Dg Chest 2 View  Result Date: 10/03/2016 CLINICAL DATA:  Cough, fever, chest pain. EXAM: CHEST  2 VIEW COMPARISON:  09/09/2015 FINDINGS: Patchy consolidation in the posterior left lower lobe consistent with pneumonia. The right lung is clear. Normal heart size and mediastinal contours. No pulmonary edema, pleural fluid or pneumothorax. No acute osseous abnormalities. IMPRESSION: Patchy left lower lobe consolidation consistent with pneumonia. Followup PA and lateral chest X-ray is recommended in 3-4 weeks following trial of antibiotic therapy to  ensure resolution and exclude underlying malignancy. Electronically Signed   By: Jeb Levering M.D.   On: 10/03/2016 05:07    Procedures Procedures (including critical care time)  Medications Ordered in ED Medications  azithromycin (ZITHROMAX) tablet 500 mg (not administered)  benzonatate (TESSALON) capsule 200 mg (200 mg Oral Given 10/03/16 0512)  albuterol (PROVENTIL) (2.5 MG/3ML) 0.083% nebulizer solution 5 mg (5 mg Nebulization Given 10/03/16 0513)  ibuprofen (ADVIL,MOTRIN) tablet 600 mg (600 mg Oral Given 10/03/16 6045)     Initial Impression / Assessment and Plan / ED Course  I have reviewed the triage vital signs and the nursing notes.  Pertinent labs & imaging results that were available during my care of the patient were reviewed by me and considered in my medical decision making (see chart for details).     Patient presents to emergency department for cough, weakness, shortness of breath. History is consistent  with pneumonia. He was given albuterol, Tessalon Perles, ibuprofen for his symptoms. Chest x-ray is pending for further evaluation.  Chest x-ray reveals a left lower lobe pneumonia. Patient given azithromycin emergency department and will discharge home with a prescription. Primary care follow-up advised in 3 days. Return precautions given. He appears well in no acute distress, vital signs are normal, no hypoxia. Patient is safe for discharge.  Final Clinical Impressions(s) / ED Diagnoses   Final diagnoses:  None    New Prescriptions New Prescriptions   No medications on file     Everlene Balls, MD 10/03/16 [phone]

## 2016-10-03 NOTE — ED Triage Notes (Signed)
Pt reports a week long bout of chest congestion with pain upon coughing. Pt states that he can't lie down flat at night and it hurts to cough. Pt denies being around anyone sick.

## 2019-10-05 ENCOUNTER — Ambulatory Visit: Payer: Self-pay | Attending: Internal Medicine

## 2019-10-05 DIAGNOSIS — Z23 Encounter for immunization: Secondary | ICD-10-CM

## 2019-10-05 NOTE — Progress Notes (Signed)
   [id number]  Name:  Alan Felman Sr.    MRN: [id number] DOB: 1956-02-09  10/05/2019  [name] was observed post Covid-19 immunization for 15 minutes without incident. He was provided with Vaccine Information Sheet and instruction to access the V-Safe system.   [name] was instructed to call 911 with any severe reactions post vaccine: Marland Kitchen Difficulty breathing  . Swelling of face and throat  . A fast heartbeat  . A bad rash all over body  . Dizziness and weakness   Immunizations Administered    Name Date Dose VIS Date Route   Pfizer COVID-19 Vaccine 10/05/2019  9:34 AM 0.3 mL 06/16/2019 Intramuscular   Manufacturer: [place]   Lot: [id number]   [place]: [id number]

## 2019-10-30 ENCOUNTER — Other Ambulatory Visit: Payer: Self-pay | Admitting: Otolaryngology

## 2019-11-01 ENCOUNTER — Ambulatory Visit: Payer: Self-pay

## 2019-11-07 ENCOUNTER — Ambulatory Visit: Payer: Self-pay | Attending: Internal Medicine

## 2019-11-07 DIAGNOSIS — Z23 Encounter for immunization: Secondary | ICD-10-CM

## 2019-11-07 NOTE — Progress Notes (Signed)
   [id number] [facility]ination [facility]  Name:  Alan Cleveringa Sr.    MRN: [id number] DOB: 05/04/1956  11/07/2019  [name] was observed post Covid-19 immunization for 15 minutes without incident. He was provided with Vaccine Information Sheet and instruction to access the V-Safe system.   [name] was instructed to call 911 with any severe reactions post vaccine: Marland Kitchen Difficulty breathing  . Swelling of face and throat  . A fast heartbeat  . A bad rash all over body  . Dizziness and weakness   Immunizations Administered    Name Date Dose VIS Date Route   Pfizer COVID-19 Vaccine 11/07/2019  9:00 AM 0.3 mL 08/30/2018 Intramuscular   Manufacturer: [place]   Lot: [id number]   [place]: [id number]

## 2019-11-14 ENCOUNTER — Other Ambulatory Visit: Payer: Self-pay

## 2019-11-14 ENCOUNTER — Encounter: Payer: Self-pay | Admitting: Cardiovascular Disease

## 2019-11-14 ENCOUNTER — Ambulatory Visit: Payer: 59 | Admitting: Cardiovascular Disease

## 2019-11-14 DIAGNOSIS — R9431 Abnormal electrocardiogram [ECG] [EKG]: Secondary | ICD-10-CM

## 2019-11-14 DIAGNOSIS — R079 Chest pain, unspecified: Secondary | ICD-10-CM | POA: Diagnosis not present

## 2019-11-14 DIAGNOSIS — E782 Mixed hyperlipidemia: Secondary | ICD-10-CM | POA: Diagnosis not present

## 2019-11-14 DIAGNOSIS — Z8249 Family history of ischemic heart disease and other diseases of the circulatory system: Secondary | ICD-10-CM | POA: Diagnosis not present

## 2019-11-14 DIAGNOSIS — I10 Essential (primary) hypertension: Secondary | ICD-10-CM

## 2019-11-14 DIAGNOSIS — E785 Hyperlipidemia, unspecified: Secondary | ICD-10-CM | POA: Insufficient documentation

## 2019-11-14 MED ORDER — METOPROLOL SUCCINATE ER 25 MG PO TB24: 12.5000 mg | ORAL_TABLET | Freq: Every day | ORAL | 3 refills | Status: DC

## 2019-11-14 MED ORDER — ASPIRIN EC 81 MG PO TBEC: 81.0000 mg | DELAYED_RELEASE_TABLET | Freq: Every day | ORAL | 3 refills | Status: DC

## 2019-11-14 NOTE — Assessment & Plan Note (Signed)
History of family history of heart disease with sister had bypass surgery at age 62.

## 2019-11-14 NOTE — Assessment & Plan Note (Signed)
Exertional chest pain for last year which become more frequent.  Given his risk factors Dr. Claudie Leach, his cardiologist, referred him here for diagnostic coronary angiography.  I have reviewed the risks, indications, and alternatives to cardiac catheterization, possible angioplasty, and stenting with the patient. Risks include but are not limited to bleeding, infection, vascular injury, stroke, myocardial infection, arrhythmia, kidney injury, radiation-related injury in the case of prolonged fluoroscopy use, emergency cardiac surgery, and death. The patient understands the risks of serious complication is 1-2 in 123XX123 with diagnostic cardiac cath and 1-2% or less with angioplasty/stenting.

## 2019-11-14 NOTE — Assessment & Plan Note (Signed)
EKG shows LVH with repolarization changes. 

## 2019-11-14 NOTE — Progress Notes (Signed)
11/14/2019 Alan Culver Sr.   08/03/1955  [id number]  Primary Physician Irwin Brakeman Higinio Roger, FNP Primary Cardiologist: Lorretta Harp MD Lupe Carney, [place]  HPI:  Alan Zettler Sr. is a 64 y.o. mildly overweight divorced African-American male father 6 children, grandfather 32 grandchildren who is retired from at age 42 from working in [place].  He was referred by Dr. Claudie Leach for diagnostic coronary angiography because of accelerated angina.  His risk factors include treated hypertension, diabetes and hyperlipidemia.  He is never smoked.  His sister did have CABG at age 21.  Is never had a heart attack or stroke.  He has noted new onset exertional chest pain over the last year which become more frequent.   Current Meds  Medication Sig  . amLODipine (NORVASC) 10 MG tablet Take 10 mg by mouth daily.  Marland Kitchen atorvastatin (LIPITOR) 80 MG tablet Take 80 mg by mouth daily.  Marland Kitchen azithromycin (ZITHROMAX) 250 MG tablet Take 1 tablet (250 mg total) by mouth daily.  . cetirizine (ZYRTEC) 10 MG tablet Take 10 mg by mouth daily as needed for allergies.  . [employer] D3 25 MCG (1000 UT) capsule Take 1,000 Units by mouth daily.  . cyclobenzaprine (FLEXERIL) 10 MG tablet Take 10 mg by mouth 3 (three) times daily as needed for muscle spasms.  . hydrALAZINE (APRESOLINE) 25 MG tablet Take 25 mg by mouth daily.  Marland Kitchen HYDROcodone-acetaminophen (NORCO/VICODIN) 5-325 MG tablet Take 1 tablet by mouth every 4 (four) hours as needed.  . nitroGLYCERIN (NITROSTAT) 0.4 MG SL tablet   . omeprazole (PRILOSEC) 20 MG capsule Take 20 mg by mouth 2 (two) times daily.  . [DISCONTINUED] amLODipine (NORVASC) 5 MG tablet Take 5 mg by mouth daily.  . [DISCONTINUED] ERGOCALCIFEROL PO Take by mouth once a week. Take on Mondays      No Known Allergies  Social History   Socioeconomic History  . Marital status: Legally Separated    Spouse name: Not on file  . Number of children: Not on  file  . Years of education: Not on file  . Highest education level: Not on file  Occupational History  . Not on file  Tobacco Use  . Smoking status: Never Smoker  . Smokeless tobacco: Never Used  Substance and Sexual Activity  . Alcohol use: No  . Drug use: No  . Sexual activity: Not on file  Other Topics Concern  . Not on file  Social History Narrative  . Not on file   Social Determinants of Health   Financial Resource Strain:   . Difficulty of Paying Living Expenses:   Food Insecurity:   . Worried About Charity fundraiser in the Last Year:   . Arboriculturist in the Last Year:   Transportation Needs:   . Film/video editor (Medical):   Marland Kitchen Lack of Transportation (Non-Medical):   Physical Activity:   . Days of Exercise per Week:   . Minutes of Exercise per Session:   Stress:   . Feeling of Stress :   Social Connections:   . Frequency of Communication with Friends and Family:   . Frequency of Social Gatherings with Friends and Family:   . Attends Religious Services:   . Active Member of Clubs or Organizations:   . Attends Archivist Meetings:   Marland Kitchen Marital Status:   Intimate Partner Violence:   . Fear of Current or Ex-Partner:   .  Emotionally Abused:   Marland Kitchen Physically Abused:   . Sexually Abused:      Review of Systems: General: negative for chills, fever, night sweats or weight changes.  Cardiovascular: negative for chest pain, dyspnea on exertion, edema, orthopnea, palpitations, paroxysmal nocturnal dyspnea or shortness of breath Dermatological: negative for rash Respiratory: negative for cough or wheezing Urologic: negative for hematuria Abdominal: negative for nausea, vomiting, diarrhea, bright red blood per rectum, melena, or hematemesis Neurologic: negative for visual changes, syncope, or dizziness All other systems reviewed and are otherwise negative except as noted above.    Blood pressure (!) 144/80, pulse 72, temperature 98.3 F (36.8 C),  weight 191 lb (86.6 kg).  General appearance: alert and no distress Neck: no adenopathy, no JVD, supple, symmetrical, trachea midline, thyroid not enlarged, symmetric, no tenderness/mass/nodules and Left carotid bruit Lungs: clear to auscultation bilaterally Heart: regular rate and rhythm, S1, S2 normal, no murmur, click, rub or gallop Extremities: extremities normal, atraumatic, no cyanosis or edema Pulses: 2+ and symmetric Skin: Skin color, texture, turgor normal. No rashes or lesions Neurologic: Alert and oriented X 3, normal strength and tone. Normal symmetric reflexes. Normal coordination and gait  EKG sinus rhythm at 72 with evidence of left ventricular hypertrophy with repolarization changes.  I personally reviewed this EKG.  ASSESSMENT AND PLAN:   Essential hypertension History of essential hypertension a blood pressure measured today at 144/80.  He is on amlodipine and hydralazine.  Hyperlipidemia History of hyperlipidemia on statin therapy followed by his PCP  Family history of heart disease History of family history of heart disease with sister had bypass surgery at age 46.  Chest pain of uncertain etiology Exertional chest pain for last year which become more frequent.  Given his risk factors Dr. Claudie Leach, his cardiologist, referred him here for diagnostic coronary angiography.  I have reviewed the risks, indications, and alternatives to cardiac catheterization, possible angioplasty, and stenting with the patient. Risks include but are not limited to bleeding, infection, vascular injury, stroke, myocardial infection, arrhythmia, kidney injury, radiation-related injury in the case of prolonged fluoroscopy use, emergency cardiac surgery, and death. The patient understands the risks of serious complication is 1-2 in 123XX123 with diagnostic cardiac cath and 1-2% or less with angioplasty/stenting.   Nonspecific abnormal electrocardiogram (ECG) (EKG) EKG shows LVH with repolarization  changes      Lorretta Harp MD [facility] 11/14/2019 3:09 PM

## 2019-11-14 NOTE — Assessment & Plan Note (Signed)
History of essential hypertension a blood pressure measured today at 144/80.  He is on amlodipine and hydralazine.

## 2019-11-14 NOTE — Patient Instructions (Signed)
Medication Instructions:   START ASPIRIN 81 MG ONCE DAILY  START METOPROLOL SUCC ER 12.5 MG ONCE DAILY= 1/2 OF THE 25 MG TABLET ONCE DAILY  *If you need a refill on your cardiac medications before your next appointment, please call your pharmacy*   Lab Work: If you have labs (blood work) drawn today and your tests are completely normal, you will receive your results only by: Marland Kitchen MyChart Message (if you have MyChart) OR . A paper copy in the mail If you have any lab test that is abnormal or we need to change your treatment, we will call you to review the results.   Testing/Procedures: Your physician has requested that you have a cardiac catheterization. Cardiac catheterization is used to diagnose and/or treat various heart conditions. Doctors may recommend this procedure for a number of different reasons. The most common reason is to evaluate chest pain. Chest pain can be a symptom of coronary artery disease (CAD), and cardiac catheterization can show whether plaque is narrowing or blocking your heart's arteries. This procedure is also used to evaluate the valves, as well as measure the blood flow and oxygen levels in different parts of your heart. For further information please visit [email or web address]. Please follow instruction sheet, as given.     Follow-Up: At [facility], you and your health needs are our priority.  As part of our continuing mission to provide you with exceptional heart care, we have created designated Provider Care Teams.  These Care Teams include your primary Cardiologist (physician) and Advanced Practice Providers (APPs -  Physician Assistants and Nurse Practitioners) who all work together to provide you with the care you need, when you need it.  We recommend signing up for the patient portal called "MyChart".  Sign up information is provided on this After Visit Summary.  MyChart is used to connect with patients for Virtual Visits (Telemedicine).  Patients are able  to view lab/test results, encounter notes, upcoming appointments, etc.  Non-urgent messages can be sent to your provider as well.   To learn more about what you can do with MyChart, go to [email or web address].    Your next appointment:    TO BE DETERMINED

## 2019-11-14 NOTE — Assessment & Plan Note (Signed)
History of hyperlipidemia on statin therapy followed by his PCP 

## 2019-11-21 ENCOUNTER — Other Ambulatory Visit (HOSPITAL_COMMUNITY): Payer: 59

## 2020-01-12 NOTE — Progress Notes (Signed)
Heather from Dr. Deeann Saint office aware patient would like to reschedule surgery when he has overnight care. Currently patient does not have anyone to take care of him after surgery.

## 2020-01-16 ENCOUNTER — Other Ambulatory Visit (HOSPITAL_COMMUNITY): Admission: RE | Admit: 2020-01-16 | Payer: 59 | Source: Ambulatory Visit

## 2020-01-19 ENCOUNTER — Encounter (HOSPITAL_BASED_OUTPATIENT_CLINIC_OR_DEPARTMENT_OTHER): Payer: Self-pay

## 2020-01-19 ENCOUNTER — Ambulatory Visit (HOSPITAL_BASED_OUTPATIENT_CLINIC_OR_DEPARTMENT_OTHER): Admit: 2020-01-19 | Payer: 59 | Admitting: Otolaryngology

## 2020-01-19 SURGERY — BIOPSY, NASOPHARYNX
Anesthesia: General | Laterality: Right

## 2020-06-25 ENCOUNTER — Other Ambulatory Visit: Payer: Self-pay | Admitting: Otolaryngology

## 2020-06-25 DIAGNOSIS — R590 Localized enlarged lymph nodes: Secondary | ICD-10-CM

## 2020-06-26 ENCOUNTER — Other Ambulatory Visit: Payer: Self-pay | Admitting: Otolaryngology

## 2020-06-26 ENCOUNTER — Other Ambulatory Visit (HOSPITAL_COMMUNITY): Payer: Self-pay | Admitting: Otolaryngology

## 2020-06-26 DIAGNOSIS — R590 Localized enlarged lymph nodes: Secondary | ICD-10-CM

## 2020-07-23 ENCOUNTER — Inpatient Hospital Stay: Admission: RE | Admit: 2020-07-23 | Payer: 59 | Source: Ambulatory Visit

## 2020-08-07 ENCOUNTER — Ambulatory Visit
Admission: RE | Admit: 2020-08-07 | Discharge: 2020-08-07 | Disposition: A | Discharge status: Home / Self Care | Payer: 59 | Source: Ambulatory Visit | Attending: Otolaryngology | Admitting: Otolaryngology

## 2020-08-07 DIAGNOSIS — R590 Localized enlarged lymph nodes: Secondary | ICD-10-CM

## 2020-08-07 MED ORDER — IOPAMIDOL (ISOVUE-300) INJECTION 61%
75.0000 mL | Freq: Once | INTRAVENOUS | Status: AC | PRN
  Administered 2020-08-07: 75 mL via INTRAVENOUS

## 2020-08-09 ENCOUNTER — Encounter (HOSPITAL_COMMUNITY): Payer: Self-pay

## 2020-08-09 NOTE — Progress Notes (Unsigned)
KG    1       Alan Henry. Male, 65 y.o., [birth year]  MRN:  [id number] Phone:  [phone] [name])       PCP:  Beverley Fiedler, FNP Coverage:  [place] [employer] Other  Next Appt With Radiology ([facility]-US 2) 08/30/2020 at 1:00 PM           RE: Biopsy Received: Yesterday  Message Details  [name], [name], MD  [name]    [name]    Previous Messages  ----- Message -----  From: [name]  Sent: 08/08/2020 12:56 PM EST  To: Ir Procedure Requests  Subject: Biopsy                      Procedure Requested: [place] Core Biopsy (lymph Node)    Reason for Procedure: Cervical lymphadenopathy    Provider Requesting: [name]  Provider Telephone: [phone]    Other Info:

## 2020-08-28 ENCOUNTER — Other Ambulatory Visit: Payer: Self-pay | Admitting: Radiology

## 2020-08-29 ENCOUNTER — Other Ambulatory Visit: Payer: Self-pay | Admitting: Student

## 2020-08-29 ENCOUNTER — Other Ambulatory Visit: Payer: Self-pay | Admitting: Radiology

## 2020-08-30 ENCOUNTER — Ambulatory Visit (HOSPITAL_COMMUNITY)
Admission: RE | Admit: 2020-08-30 | Discharge: 2020-08-30 | Disposition: A | Discharge status: Home / Self Care | Payer: 59 | Source: Ambulatory Visit | Attending: Otolaryngology | Admitting: Otolaryngology

## 2020-08-30 ENCOUNTER — Other Ambulatory Visit: Payer: Self-pay

## 2020-08-30 DIAGNOSIS — R1310 Dysphagia, unspecified: Secondary | ICD-10-CM | POA: Diagnosis not present

## 2020-08-30 DIAGNOSIS — R0789 Other chest pain: Secondary | ICD-10-CM | POA: Insufficient documentation

## 2020-08-30 DIAGNOSIS — E785 Hyperlipidemia, unspecified: Secondary | ICD-10-CM | POA: Diagnosis not present

## 2020-08-30 DIAGNOSIS — C969 Malignant neoplasm of lymphoid, hematopoietic and related tissue, unspecified: Secondary | ICD-10-CM | POA: Diagnosis not present

## 2020-08-30 DIAGNOSIS — I10 Essential (primary) hypertension: Secondary | ICD-10-CM | POA: Diagnosis not present

## 2020-08-30 DIAGNOSIS — R590 Localized enlarged lymph nodes: Secondary | ICD-10-CM | POA: Diagnosis present

## 2020-08-30 MED ORDER — LIDOCAINE HCL (PF) 1 % IJ SOLN
INTRAMUSCULAR | Status: AC
  Filled 2020-08-30: qty 30

## 2020-08-30 MED ORDER — SODIUM CHLORIDE 0.9 % IV SOLN: INTRAVENOUS | Status: DC

## 2020-08-30 NOTE — H&P (Signed)
Chief Complaint: Alan Henry was seen in consultation today for cervical lymph node biopsy   Referring Physician(s): [name]  Supervising Physician: Markus Daft  Alan Henry Status: Alan Henry  History of Present Illness: Alan Ketcham. is a 65 y.o. male with a medical history significant for HTN, hyperlipidemia and exertional chest pain of uncertain etiology. Alan Henry presented to his PCP with complaints of bilateral cervical lymphadenopathy and pain/difficulty swallowing for 6 months. Imaging was obtained.   CT Soft Tissue Neck w/Contrast 08/07/20 IMPRESSION: Nasopharyngeal soft tissue prominence, greatest to the right. Soft tissue effacement of the adjacent right parapharyngeal fat, suspected to at least partially reflect an enlarged right retropharyngeal lymph node. Bulky bilateral cervical, and likely right intraparotid, lymphadenopathy. Primary differential considerations for this constellation of findings: nasopharyngeal carcinoma with metastatic lymphadenopathy versus lymphoma. Consider direct tissue sampling.  Interventional Radiology has been asked to evaluate this Alan Henry for an image-guided cervical lymph node biopsy for further work up. Imaging has been reviewed and procedure approved by Dr. Serafina Royals  No past medical history on file.  Past Surgical History:  Procedure Laterality Date  . HERNIA REPAIR    . ROTATOR CUFF REPAIR    . Torn Labrum      Allergies: Alan Henry has no known allergies.  Medications: Prior to Admission medications   Medication Sig Start Date End Date Taking? Authorizing Provider  amLODipine (NORVASC) 10 MG tablet Take 10 mg by mouth daily. 09/08/19  Yes [provider]  atorvastatin (LIPITOR) 80 MG tablet Take 80 mg by mouth daily.   Yes [provider]  cetirizine (ZYRTEC) 10 MG tablet Take 10 mg by mouth daily.   Yes [provider]  [employer] D3 25 MCG (1000 UT) capsule Take 1,000 Units by mouth daily. 06/25/19   Yes [provider]  omeprazole (PRILOSEC) 20 MG capsule Take 20 mg by mouth 2 (two) times daily as needed (acid reflux/indigestion.). 06/23/19  Yes [provider]  sildenafil (VIAGRA) 50 MG tablet Take 50-100 mg by mouth daily as needed for erectile dysfunction. 08/01/20  Yes [provider]  metoprolol succinate (TOPROL XL) 25 MG 24 hr tablet Take 0.5 tablets (12.5 mg total) by mouth daily. Alan Henry not taking: Reported on 08/27/2020 11/14/19   Lorretta Harp, MD  nitroGLYCERIN (NITROSTAT) 0.4 MG SL tablet Place 0.4 mg under the tongue every 5 (five) minutes x 3 doses as needed for chest pain. 10/22/19   [provider]     No family history on file.  Social History   Socioeconomic History  . Marital status: Legally Separated    Spouse name: Not on file  . Number of children: Not on file  . Years of education: Not on file  . Highest education level: Not on file  Occupational History  . Not on file  Tobacco Use  . Smoking status: Never Smoker  . Smokeless tobacco: Never Used  Substance and Sexual Activity  . Alcohol use: No  . Drug use: No  . Sexual activity: Not on file  Other Topics Concern  . Not on file  Social History Narrative  . Not on file   Social Determinants of Health   Financial Resource Strain: Not on file  Food Insecurity: Not on file  Transportation Needs: Not on file  Physical Activity: Not on file  Stress: Not on file  Social Connections: Not on file    Review of Systems: A 12 point ROS discussed and pertinent positives are indicated in the  HPI above.  All other systems are negative.  Review of Systems  Constitutional: Negative for appetite change and fatigue.       Occasional night sweats  HENT: Positive for facial swelling and trouble swallowing.   Respiratory: Negative for cough and shortness of breath.   Gastrointestinal: Negative for abdominal pain, diarrhea, nausea and vomiting.  Musculoskeletal: Positive for  neck stiffness. Negative for back pain.  Neurological: Negative for dizziness and headaches.  Hematological: Positive for adenopathy.    Vital Signs: BP 131/88   Pulse 86   Resp 14   Ht 5' 9.5" (1.765 m)   Wt 192 lb (87.1 kg)   SpO2 97%   BMI 27.95 kg/m   Physical Exam Constitutional:      General: Alan Henry is not in acute distress. Cardiovascular:     Rate and Rhythm: Normal rate and regular rhythm.     Pulses: Normal pulses.     Heart sounds: Normal heart sounds.  Pulmonary:     Effort: Pulmonary effort is normal.     Breath sounds: Normal breath sounds.  Abdominal:     General: Bowel sounds are normal.     Palpations: Abdomen is soft.  Musculoskeletal:     Cervical back: Decreased range of motion.  Lymphadenopathy:     Cervical: Cervical adenopathy present.  Skin:    General: Skin is warm and dry.  Neurological:     Mental Status: Alan Henry is alert and oriented to person, place, and time.     Imaging: CT SOFT TISSUE NECK W CONTRAST  Result Date: 08/07/2020 CLINICAL DATA:  Cervical lymphadenopathy. Additional history provided by scanning technologist: Alan Henry reports bilateral cervical lymphadenopathy, painful, difficulty swallowing, symptoms for 6 months. EXAM: CT NECK WITH CONTRAST TECHNIQUE: Multidetector CT imaging of the neck was performed using the standard protocol following the bolus administration of intravenous contrast. CONTRAST:  58mL ISOVUE-300 IOPAMIDOL (ISOVUE-300) INJECTION 61% COMPARISON:  No pertinent prior exams available for comparison. FINDINGS: Pharynx and larynx: There is fullness of the nasopharyngeal soft tissues, greatest to the right. Additionally, there is soft tissue effacement of the adjacent right parapharyngeal fat with soft tissue measuring 2.7 x 2.2 cm in transaxial dimensions. This likely at least partially reflects an enlarged right retropharyngeal lymph node (series 3, image 23). Streak artifact from dental restoration partially obscures the oral  cavity. No appreciable swelling or discrete mass within the oral cavity, remainder of the pharynx, or larynx. Salivary glands: 3.3 x 2.8 cm relatively homogeneous mass within the right parotid gland favored to reflect an enlarged lymph node (series 3, image 23). Addition adjacent borderline enlarged right intraparotid lymph nodes measuring 6 mm. The left parotid and bilateral submandibular glands are unremarkable. Thyroid: Unremarkable. Lymph nodes: Right level 2, 3 and 4 lymphadenopathy. Left level 2 and 3 lymphadenopathy. Index nodes are as follows. Bulky right level 2 and 3 lymphadenopathy with nodes measuring up to 5.0 x 4.1 cm (series 6, image 31). Bulky left level 2 lymphadenopathy with nodes measuring up to 4.1 x 3.2 cm (series 6, image 76). Vascular: The major vascular structures of the neck are patent. The right internal jugular vein is significantly effaced within the mid to upper neck. Limited intracranial: No acute intracranial abnormality identified Visualized orbits: Incompletely imaged. No mass or acute finding at the imaged levels. Mastoids and visualized paranasal sinuses: Trace bilateral ethmoid sinus mucosal thickening. Tiny left maxillary sinus mucous retention cyst. Skeleton: No acute bony abnormality or aggressive osseous lesion. Cervical spondylosis with multilevel disc space  narrowing, disc bulges, uncovertebral hypertrophy and facet arthrosis. Disc space narrowing is greatest at C5-C6 (moderate) and C6-C7 (moderate/severe). Ventral C5-C6 and C6-C7 osteophytes. Upper chest: No consolidation within the imaged lung apices. These results will be called to the ordering clinician or representative by the Radiologist Assistant, and communication documented in the PACS or [employer]. IMPRESSION: Nasopharyngeal soft tissue prominence, greatest to the right. Soft tissue effacement of the adjacent right parapharyngeal fat, suspected to at least partially reflect an enlarged right retropharyngeal  lymph node. Bulky bilateral cervical, and likely right intraparotid, lymphadenopathy. Primary differential considerations for this constellation of findings: nasopharyngeal carcinoma with metastatic lymphadenopathy versus lymphoma. Consider direct tissue sampling. Electronically Signed   By: Kellie Simmering DO   On: 08/07/2020 15:39    Labs:  CBC: No results for input(s): WBC, HGB, HCT, PLT in the last 8760 hours.  COAGS: No results for input(s): INR, APTT in the last 8760 hours.  BMP: No results for input(s): NA, K, CL, CO2, GLUCOSE, BUN, CALCIUM, CREATININE, GFRNONAA, GFRAA in the last 8760 hours.  Invalid input(s): CMP  LIVER FUNCTION TESTS: No results for input(s): BILITOT, AST, ALT, ALKPHOS, PROT, ALBUMIN in the last 8760 hours.  TUMOR MARKERS: No results for input(s): AFPTM, CEA, CA199, CHROMGRNA in the last 8760 hours.  Assessment and Plan:  Cervical adenopathy: Alan A. Nyoka Cowden Sr., 65 year old male, presents today to the [facility] department for an image-guided cervical lymph node biopsy. This procedure will be done with local anesthesia only.   Risks and benefits of this procedure were discussed with the Alan Henry and/or Alan Henry's family including, but not limited to bleeding, infection, damage to adjacent structures or low yield requiring additional tests.  All of the questions were answered and there is agreement to proceed.  Consent signed and in chart.  Thank you for this interesting consult.  I greatly enjoyed meeting Alan Murtha Sr. and look forward to participating in their care.  A copy of this report was sent to the requesting provider on this date.  Electronically Signed: Soyla Dryer, AGACNP-BC [phone] 08/30/2020, 11:56 AM   I spent a total of  30 Minutes   in face to face in clinical consultation, greater than 50% of which was counseling/coordinating care for image-guided cervical lymph node biopsy

## 2020-08-30 NOTE — Progress Notes (Signed)
Site to right neck with band-aid. Dry and intact. D/C home by wheelchair with wife. Awake and alert, in no distress.

## 2020-08-30 NOTE — Procedures (Signed)
Interventional Radiology Procedure:   Indications: Cervical lymphadenopathy  Procedure: US guided core biopsy of right cervical lymph node  Findings: 6 cores obtained and placed in saline  Complications: none     EBL: less than 10 ml  Plan: Discharge to home   [place]. Anselm Pancoast, MD  Pager: [phone]

## 2020-08-30 NOTE — Discharge Instructions (Addendum)

## 2020-09-10 ENCOUNTER — Other Ambulatory Visit: Payer: Self-pay | Admitting: Otolaryngology

## 2020-09-10 ENCOUNTER — Other Ambulatory Visit (HOSPITAL_COMMUNITY): Payer: Self-pay | Admitting: Otolaryngology

## 2020-09-10 DIAGNOSIS — R590 Localized enlarged lymph nodes: Secondary | ICD-10-CM

## 2020-09-20 ENCOUNTER — Encounter (HOSPITAL_COMMUNITY): Admission: RE | Admit: 2020-09-20 | Payer: 59 | Source: Ambulatory Visit

## 2020-09-24 ENCOUNTER — Ambulatory Visit (HOSPITAL_COMMUNITY): Admission: RE | Admit: 2020-09-24 | Payer: 59 | Source: Ambulatory Visit

## 2020-09-27 ENCOUNTER — Other Ambulatory Visit: Payer: Self-pay

## 2020-09-27 ENCOUNTER — Ambulatory Visit (HOSPITAL_COMMUNITY)
Admission: RE | Admit: 2020-09-27 | Discharge: 2020-09-27 | Disposition: A | Discharge status: Home / Self Care | Payer: 59 | Source: Ambulatory Visit | Attending: Otolaryngology | Admitting: Otolaryngology

## 2020-09-27 DIAGNOSIS — R590 Localized enlarged lymph nodes: Secondary | ICD-10-CM | POA: Insufficient documentation

## 2020-09-27 LAB — GLUCOSE, CAPILLARY: Glucose-Capillary: 113 mg/dL — ABNORMAL HIGH (ref 70–99)

## 2020-09-27 MED ORDER — FLUDEOXYGLUCOSE F - 18 (FDG) INJECTION
10.0000 | Freq: Once | INTRAVENOUS | Status: AC
  Administered 2020-09-27: 10.77 via INTRAVENOUS

## 2020-10-02 ENCOUNTER — Other Ambulatory Visit: Payer: Self-pay

## 2020-10-02 DIAGNOSIS — R5383 Other fatigue: Secondary | ICD-10-CM

## 2020-10-02 DIAGNOSIS — C760 Malignant neoplasm of head, face and neck: Secondary | ICD-10-CM

## 2020-10-02 DIAGNOSIS — R5381 Other malaise: Secondary | ICD-10-CM

## 2020-10-02 NOTE — Progress Notes (Signed)
Oncology Nurse Navigator Documentation  I received a request from Dr. Isidore Moos to add EBV testing to Alan Henry's 08/30/20 right neck lymph node biopsy by Dr. Wilburn Cornelia. I called and spoke to [facility] in the Pathology department and requested the EBV testing. She voiced that she would place an order for the request and work on it.   Harlow Asa RN, BSN, OCN Head & Neck Oncology Nurse [place] at [facility] Phone # [phone]  Fax # [phone]

## 2020-10-03 ENCOUNTER — Telehealth: Payer: Self-pay | Admitting: Hematology and Oncology

## 2020-10-03 NOTE — Progress Notes (Signed)
Oncology Nurse Navigator Documentation  Placed introductory call to new referral patient Alan Henry  Introduced myself as the H&N oncology nurse navigator that works with Dr. Isidore Moos and Dr. Chryl Heck  to whom he has been referred by Dr. Wilburn Cornelia.  He confirmed understanding of referral.  Briefly explained my role as his navigator, provided my contact information.   Confirmed understanding of upcoming appts and [place] location, explained arrival and registration process.  I explained the purpose of a dental evaluation prior to starting RT, indicated he would be contacted by [facility] to arrange an appt.    I encouraged him to call with questions/concerns as he moves forward with appts and procedures.    He verbalized understanding of information provided, expressed appreciation for my call.   Navigator Initial Assessment . Employment Status: he works Retail buyer . Currently on FMLA / STD: no . Living Situation: he lives with his wife, but she is out of town helping with another family member.  . Support System: friends, family . PCP: Dr. Jeanie Cooks  . PCD: none . Financial Concerns:no . Transportation Needs: no . Sensory Deficits:no . Language Barriers/Interpreter Needed:  no . Ambulation Needs: no . DME Used in Home: no . Psychosocial Needs:  no . Concerns/Needs Understanding Cancer:  addressed/answered by navigator to best of ability . Self-Expressed Needs: no   Harlow Asa RN, BSN, OCN Head & Neck Oncology Nurse [place] at [facility] Phone # [phone]  Fax # [phone]

## 2020-10-03 NOTE — Progress Notes (Addendum)
Head and Neck Cancer Location of Tumor / Histology:  Nasopharyngeal carcinoma with metastatic lymphadenopathy; Squamous cell carcinoma, p16(+)   Patient presented with symptoms of: gradually enlarging bilateral cervical lymphadenopathy. Had initial consult with ENT Dr. Wilburn Cornelia on 06/18/2020: His symptoms initially began 6 months ago with right-sided neck swelling and fullness associated with ear pain. He was treated through an urgent care. Over the last 6 months he is noted gradual enlarging bilateral lymph nodes with associated mild discomfort and pressure sensation.  Biopsies revealed:  08/30/2020 (EBV testing will be reported in an addendum) FINAL MICROSCOPIC DIAGNOSIS:  A. LYMPH NODE, RIGHT NECK, NEEDLE CORE BIOPSY:  - Squamous cell carcinoma, p16 positive.  - See comment.  COMMENT:  The core biopsies show scattered aggregates of malignant epithelioid cells associated with inflammation and abundant intervening collagenous fibrous tissue. Immunohistochemistry shows positivity with cytokeratin 5/6, p40, cytokeratin AE1/AE3, p63 and p16. CD45 highlights the inflammatory cells. The immunophenotype is consistent with squamous cell carcinoma  Nutrition Status Yes No Comments  Weight changes? []  [x]  Possible minor weight loss, but denies anything significant/drastic  Swallowing concerns? [x]  []  Reports occasional difficulty with solid foods (though doesn't feel like his diet has had to alter). States difficulty mainly occurs in the morning or when he's lying flat (takes more effort to swallow saliva when supine)  PEG? []  [x]     Referrals Yes No Comments  Social Work? [x]  []    Dentistry? [x]  []  Dr. Sandi Mariscal  Swallowing therapy? [x]  []    Nutrition? [x]  []    Med/Onc? [x]  []  Dr. Arletha Pili Iruku   Safety Issues Yes No Comments  Prior radiation? []  [x]    Pacemaker/ICD? []  [x]    Possible current pregnancy? []  [x]  N/A  Is the patient on methotrexate? []  [x]      Tobacco/Marijuana/Snuff/ETOH use: Patient has never smoked or used smokeless tobacco. Reports occasional alcohol consumption (only for celebrations). Denies any recreational drug use  Past/Anticipated interventions by otolaryngology, if any:  Follow-up as needed with Dr. [name]  Past/Anticipated interventions by medical oncology, if any:  Scheduled for consult with Dr. Alphonzo Severance Iruku on 10/08/2020  Current Complaints / other details:   Patient has received both Pfizer vaccines as well as Product/process development scientist

## 2020-10-03 NOTE — Telephone Encounter (Signed)
Received a new pt referral from Dr. Dellie Burns for squamous cell carcinoma of the head and neck. Pt has been cld and scheduled to see Dr. Chryl Heck on 4/5 at 11:20am. Pt aware to arrive 20 minutes early.

## 2020-10-04 ENCOUNTER — Ambulatory Visit
Admission: RE | Admit: 2020-10-04 | Discharge: 2020-10-04 | Disposition: A | Discharge status: Home / Self Care | Payer: 59 | Source: Ambulatory Visit | Attending: Radiation Oncology | Admitting: Radiation Oncology

## 2020-10-04 ENCOUNTER — Other Ambulatory Visit: Payer: Self-pay

## 2020-10-04 ENCOUNTER — Ambulatory Visit (HOSPITAL_BASED_OUTPATIENT_CLINIC_OR_DEPARTMENT_OTHER)
Admission: RE | Admit: 2020-10-04 | Discharge: 2020-10-04 | Disposition: A | Discharge status: Home / Self Care | Payer: 59 | Source: Ambulatory Visit | Attending: Radiation Oncology | Admitting: Radiation Oncology

## 2020-10-04 ENCOUNTER — Encounter: Payer: Self-pay | Admitting: Radiation Oncology

## 2020-10-04 VITALS — BP 138/87 | HR 90 | Temp 98.1°F | Resp 17 | Ht 69.5 in | Wt 185.5 lb

## 2020-10-04 DIAGNOSIS — C113 Malignant neoplasm of anterior wall of nasopharynx: Secondary | ICD-10-CM | POA: Insufficient documentation

## 2020-10-04 DIAGNOSIS — Z79899 Other long term (current) drug therapy: Secondary | ICD-10-CM | POA: Insufficient documentation

## 2020-10-04 DIAGNOSIS — R2242 Localized swelling, mass and lump, left lower limb: Secondary | ICD-10-CM

## 2020-10-04 DIAGNOSIS — C118 Malignant neoplasm of overlapping sites of nasopharynx: Secondary | ICD-10-CM | POA: Insufficient documentation

## 2020-10-04 LAB — SURGICAL PATHOLOGY

## 2020-10-04 NOTE — Progress Notes (Signed)
Oncology Nurse Navigator Documentation  Met with patient during initial consult with Dr. Isidore Moos.   . Further introduced myself as his Navigator, explained my role as a member of the Care Team. . Provided New Patient Information packet: o Contact information for physician, this navigator, other members of the Care Team o Advance Directive information ([place] blue pamphlet with LCSW insert); provided [facility] AD booklet at his request,  o Fall Prevention Patient [place] sheet o Symptom Management Clinic information o [facility] map with highlight of [place] o SLP Information sheet . Provided and discussed educational handouts for PEG and PAC. Marland Kitchen Assisted with post-consult appt scheduling. Mr. Mccrone was scheduled by Enid Derry for venous doppler this afternoon at [facility] as ordered by Dr. Isidore Moos.  . Discussed the location of Dr. Raynelle Dick office and [facility] as reference for future appts, including arrival procedure for these appts.   . He verbalized understanding of information provided. . I encouraged them to call with questions/concerns moving forward.  Harlow Asa, RN, BSN, OCN Head & Neck Oncology Nurse [place] at [place] [phone]

## 2020-10-04 NOTE — Progress Notes (Addendum)
Radiation Oncology         [phone]) [phone] ________________________________  Initial Outpatient Consultation  Name: Alan Rosol Sr. MRN: [id number]  Date: 10/04/2020  DOB: 04/25/1956  BT:DVVOHYW, Christean Grief, MD  [name], MD   REFERRING PHYSICIAN: [name], MD  DIAGNOSIS:    ICD-10-CM   1. 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 is a 65 y.o. male who presented with six-month history of gradually enlarging bilateral lymph nodes with associated mild discomfort and pressure.  Subsequently, the patient saw Dr. Wilburn Cornelia, who recommended CT scan of neck and ultrasound-guided needle core biopsy for soft tissue diagnosis.  Biopsy of right neck lymph node on 08/30/2020 revealed: squamous cell carcinoma, p16 positive. EBV ordered, pending.  Pertinent imaging thus far includes: 1. Soft tissue neck CT scan performed on 08/07/2020 that showed nasopharyngeal soft tissue prominence, greatest to the right. There was soft tissue effacement of the adjacent right parapharyngeal fat, suspected to at least partially reflect an enlarged right retropharyngeal lymph node. Additionally, there was bulk bilateral cervical, and likely right intraparotid, lymphadenopathy. Primary differential considerations for constellation of findings included nasopharyngeal carcinoma with metastatic lymphadenopathy vs lymphoma.  2. PET scan performed on 09/27/2020 that showed intense FDG uptake within the area of increased soft tissue fullness in the  posterior nasopharynx. Primary nasopharyngeal neoplasm could not be excluded. Additionally, there was extensive, bulky, bilateral FDG avid cervical adenopathy. A large FDG avid lymph node was also identified within the right parotid gland. Imaging findings were compatible with metastatic adenopathy. Finally, there was a sub-centimeter right supraclavicular lymph node that exhibited mild FDG uptake above background activity, equivocal for nodal metastasis. There were no additional signs of thoracic, abdominal, or pelvic metastasis. There was no evidence for osseous metastatic disease.  I have personally reviewed his images.  Swallowing issues, if any: Occasional difficulty with solid foods  Weight Changes: Possible minor weight loss  Tobacco history, if any: None  ETOH abuse, if any: None  Prior cancers, if any: None  He is retired, and he used to work for the city in [employer].  PREVIOUS RADIATION THERAPY: No  PAST MEDICAL HISTORY:  has no past medical history on file.    PAST SURGICAL HISTORY: Past Surgical History:  Procedure Laterality Date  . HERNIA REPAIR    . ROTATOR CUFF REPAIR    . Torn Labrum      FAMILY HISTORY: family history is not on file.  SOCIAL HISTORY:  reports that he has never smoked. He has never used smokeless tobacco. He reports previous alcohol use. He reports that he does not use drugs.  ALLERGIES: Patient has no known allergies.  MEDICATIONS:  Current Outpatient Medications  Medication Sig Dispense Refill  . amLODipine (NORVASC) 10 MG tablet Take 10 mg by mouth daily.    Marland Kitchen atorvastatin (LIPITOR) 80 MG tablet Take 80 mg by mouth daily.    . cetirizine (ZYRTEC) 10 MG tablet Take 10 mg by mouth daily.    . [employer] D3 25 MCG (1000 UT) capsule Take 1,000 Units by mouth  daily.    . omeprazole (PRILOSEC) 20 MG capsule Take 20 mg by mouth 2 (two) times daily as needed (acid reflux/indigestion.).    Marland Kitchen nitroGLYCERIN (NITROSTAT) 0.4 MG SL tablet Place 0.4  mg under the tongue every 5 (five) minutes x 3 doses as needed for chest pain.    . sildenafil (VIAGRA) 50 MG tablet Take 50-100 mg by mouth daily as needed for erectile dysfunction.     No current facility-administered medications for this encounter.    REVIEW OF SYSTEMS:  Notable for that above.   PHYSICAL EXAM:  height is 5' 9.5" (1.765 m) and weight is 185 lb 8 oz (84.1 kg). His oral temperature is 98.1 F (36.7 C). His blood pressure is 138/87 and his pulse is 90. His respiration is 17 and oxygen saturation is 98%.   General: Alert and oriented, in no acute distress HEENT: Head is normocephalic. Extraocular movements are intact. Oropharynx is notable for no visible tumor Neck: Neck is notable for Bilateral neck adenopathy.  He has palpable adenopathy in the right parotid region and bilateral cervical regions.  The dominant mass is in the right level 2-3 region measuring approximately 7 cm.  There is a separate palpable mass that extends into the right supraclavicular region as well Heart: Regular in rate and rhythm with no murmurs, rubs, or gallops. Chest: Clear to auscultation bilaterally, with no rhonchi, wheezes, or rales. Abdomen: Soft, nontender, nondistended, with no rigidity or guarding. Extremities: No cyanosis or edema. Lymphatics: see Neck Exam Skin: No concerning lesions.  No tumor ulceration through skin of neck Musculoskeletal: symmetric strength and muscle tone throughout. Neurologic: Cranial nerves II through XII are grossly intact. No obvious focalities. Speech is fluent. Coordination is intact. Psychiatric: Judgment and insight are intact. Affect is appropriate.   ECOG = 1  0 - Asymptomatic (Fully active, able to carry on all predisease activities without restriction)  1 - Symptomatic but completely ambulatory (Restricted in physically strenuous activity but ambulatory and able to carry out work of a light or sedentary nature. For example, light housework, office  work)  2 - Symptomatic, <50% in bed during the day (Ambulatory and capable of all self care but unable to carry out any work activities. Up and about more than 50% of waking hours)  3 - Symptomatic, >50% in bed, but not bedbound (Capable of only limited self-care, confined to bed or chair 50% or more of waking hours)  4 - Bedbound (Completely disabled. Cannot carry on any self-care. Totally confined to bed or chair)  5 - Death   Eustace Pen MM, Creech RH, Tormey DC, et al. [phone]). "Toxicity and response criteria of the [facility]". [place] Oncol. 5 (6): 649-55   LABORATORY DATA:  Lab Results  Component Value Date   WBC 5.8 11/20/2015   HGB 15.1 11/20/2015   HCT 45.5 11/20/2015   MCV 91.0 11/20/2015   PLT 246 11/20/2015   CMP     Component Value Date/Time   NA 140 11/20/2015 1436   K 4.2 11/20/2015 1436   CL 104 11/20/2015 1436   CO2 25 11/20/2015 1436   GLUCOSE 139 (H) 11/20/2015 1436   BUN 20 11/20/2015 1436   CREATININE 2.36 (H) 11/20/2015 1436   CALCIUM 9.1 11/20/2015 1436   PROT 7.2 11/20/2015 1436   ALBUMIN 3.9 11/20/2015 1436   AST 19 11/20/2015 1436   ALT 25 11/20/2015 1436   ALKPHOS 65 11/20/2015 1436   BILITOT 1.2 11/20/2015 1436  GFRNONAA 29 (L) 11/20/2015 1436   GFRAA 34 (L) 11/20/2015 1436      No results found for: TSH   RADIOGRAPHY: NM PET Image Initial (PI) Skull Base To Thigh  Result Date: 09/28/2020 CLINICAL DATA:  Initial treatment strategy for squamous cell carcinoma of the head and neck. EXAM: NUCLEAR MEDICINE PET SKULL BASE TO THIGH TECHNIQUE: 10.77 mCi F-18 FDG was injected intravenously. Full-ring PET imaging was performed from the skull base to thigh after the radiotracer. CT data was obtained and used for attenuation correction and anatomic localization. Fasting blood glucose: 113 mg/dl COMPARISON:  CT neck 08/07/2020 FINDINGS: Mediastinal blood pool activity: SUV max 3.08 Liver activity: SUV max NA NECK: Intense FDG  uptake corresponding to increased posterior nasal pharyngeal soft tissue has an SUV max of 17.29, image 15/4. Bulky bilateral FDG avid cervical lymph nodes are identified, including: Right retropharyngeal lymph node measures 1.7 cm and has an SUV max of 14.78, image 17/4. Right parotid lymph node measures 2.6 cm within SUV max of 9.69. Right level 2 node measures 3.1 cm and has an SUV max of 15.8, image 25/4. Right level 3/4 node measures 3.7 cm and has an SUV max of 15.34, image 42/4. Left level 2 node measures 3.2 cm and has an SUV max of 18.2. Incidental CT findings: none CHEST: Right supraclavicular node measures 0.7 cm within SUV max of 3.7, image 50/4. No FDG avid axillary, mediastinal or hilar lymph nodes. Incidental CT findings: No suspicious or FDG avid pulmonary nodules. Tiny nodule in the lateral right apex measures 3 mm and is too small to characterize by PET-CT, image 10/8. Aortic atherosclerosis. Coronary artery calcifications. ABDOMEN/PELVIS: No abnormal FDG uptake within the liver, pancreas, or spleen. No abnormal uptake within the adrenal glands. No hypermetabolic abdominopelvic lymph nodes. Incidental CT findings: Aortic atherosclerosis. Bilateral renal calculi. No hydronephrosis. SKELETON: No focal hypermetabolic activity to suggest skeletal metastasis. Incidental CT findings: none IMPRESSION: 1. There is intense FDG uptake within the area of increased soft tissue fullness in the posterior nasopharynx. Cannot exclude primary nasopharyngeal neoplasm. 2. Extensive, bulky bilateral FDG avid cervical adenopathy. Large FDG avid lymph node is also identified within the right parotid gland. Imaging findings compatible with metastatic adenopathy. 3. Subcentimeter right supraclavicular lymph node exhibits mild FDG uptake above background activity. Equivocal for nodal metastasis. No additional signs of thoracic, abdominal, or pelvic metastasis. No evidence for osseous metastatic disease. Electronically  Signed   By: Kerby Moors M.D.   On: 09/28/2020 18:24   VAS [place] LOWER EXTREMITY VENOUS (DVT)  Result Date: 10/04/2020  Lower Venous DVT Study Indications: History of swelling LT knee/calf.  Risk Factors: Cancer nasopharyngeal soft palate. Comparison Study: No prior studies. Performing Technologist: Darlin Coco RDMS,RVT  Examination Guidelines: A complete evaluation includes B-mode imaging, spectral Doppler, color Doppler, and power Doppler as needed of all accessible portions of each vessel. Bilateral testing is considered an integral part of a complete examination. Limited examinations for reoccurring indications may be performed as noted. The reflux portion of the exam is performed with the patient in reverse Trendelenburg.  +-----+---------------+---------+-----------+----------+--------------+ RIGHTCompressibilityPhasicitySpontaneityPropertiesThrombus Aging +-----+---------------+---------+-----------+----------+--------------+ CFV  Full           Yes      Yes                                 +-----+---------------+---------+-----------+----------+--------------+   +---------+---------------+---------+-----------+----------+--------------+ LEFT     CompressibilityPhasicitySpontaneityPropertiesThrombus Aging +---------+---------------+---------+-----------+----------+--------------+ CFV  Full           Yes      Yes                                 +---------+---------------+---------+-----------+----------+--------------+ SFJ      Full                                                        +---------+---------------+---------+-----------+----------+--------------+ FV Prox  Full                                                        +---------+---------------+---------+-----------+----------+--------------+ FV Mid   Full                                                        +---------+---------------+---------+-----------+----------+--------------+ FV  DistalFull                                                        +---------+---------------+---------+-----------+----------+--------------+ PFV      Full                                                        +---------+---------------+---------+-----------+----------+--------------+ POP      Full           Yes      Yes                                 +---------+---------------+---------+-----------+----------+--------------+ PTV      Full                                                        +---------+---------------+---------+-----------+----------+--------------+ PERO     Full                                                        +---------+---------------+---------+-----------+----------+--------------+     Summary: RIGHT: - No evidence of common femoral vein obstruction.  LEFT: - There is no evidence of deep vein thrombosis in the lower extremity.  - No cystic structure found in the popliteal fossa.  *See table(s) above for measurements and observations. Electronically signed by Monica Martinez MD on 10/04/2020 at 4:54:22 PM.  Final       IMPRESSION/PLAN: Nasopharyngeal cancer  This is a delightful patient with head and neck cancer, nasopharyngeal primary.  This is p16 positive.  He denies history of smoking.  EBV is pending.  I recommend radiotherapy for this patient.  Anticipate concurrent chemotherapy.  I have personally discussed him with Dr. Wilburn Cornelia; surgery is not recommended unless needed for salvage.  We discussed the potential risks, benefits, and side effects of radiotherapy. We talked in detail about acute and late effects. We discussed that some of the most bothersome acute effects may be mucositis, dysgeusia, salivary changes, skin irritation, hair loss, dehydration, weight loss and fatigue. We talked about late effects which include but are not necessarily limited to dysphagia, hypothyroidism, nerve injury, vascular injury, spinal cord injury,  xerostomia, trismus, neck edema, and potential injury to any of the tissues in the head and neck region. No guarantees of treatment were given. A consent form was signed and placed in the patient's medical record. The patient is enthusiastic about proceeding with treatment. I look forward to participating in the patient's care.    He understands that he has a serious life-threatening disease.  He understands that this cancer tends to be very sensitive to radiation and chemotherapy and that treatment will be rigorous and intense, with curative intent.  Simulation (treatment planning) will take place after the patient is cleared by dentistry  We also discussed that the treatment of head and neck cancer is a multidisciplinary process to maximize treatment outcomes and quality of life. For this reason the following referrals have been or will be made:   Medical oncology to discuss chemotherapy    Dentistry for dental evaluation, possible extractions in the radiation fields, and /or advice on reducing risk of cavities, osteoradionecrosis, or other oral issues.   Nutritionist for nutrition support during and after treatment.   Speech language pathology for swallowing and/or speech therapy.   Social work for social support.    Physical therapy due to risk of lymphedema in neck and deconditioning.   Baseline labs including TSH.  Of note he reports a recent history of transient but notable swelling in his lower extremity.  This resolved on its own but out of caution we will order a venous Doppler ultrasound of that lower extremity to rule out DVT today.  On date of service, in total, I spent 60 minutes on this encounter. Patient was seen in person.  __________________________________________   Eppie Gibson, MD  This document serves as a record of services personally performed by Eppie Gibson, MD. It was created on his behalf by Clerance Lav, a trained medical scribe. The creation of this record  is based on the scribe's personal observations and the provider's statements to them. This document has been checked and approved by the attending provider.

## 2020-10-04 NOTE — Progress Notes (Signed)
Lower extremity venous LT study completed.  Preliminary results relayed to Anderson Malta, RN with Dr. Isidore Moos.   See CV Proc for preliminary results report.   Darlin Coco, RDMS, RVT

## 2020-10-07 ENCOUNTER — Encounter: Payer: Self-pay | Admitting: Radiation Oncology

## 2020-10-07 NOTE — Progress Notes (Signed)
Dental Form with Estimates of Radiation Dose      Diagnosis:    ICD-10-CM   1. 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   Please contact Eppie Gibson, MD, with patient's disposition after evaluation and/or dental treatment. (I will be out of office during the week of 4/11 so please also contact supportive staff as needed, per usual).

## 2020-10-08 ENCOUNTER — Inpatient Hospital Stay: Payer: 59 | Attending: Hematology and Oncology | Admitting: Hematology and Oncology

## 2020-10-08 ENCOUNTER — Encounter: Payer: Self-pay | Admitting: Hematology and Oncology

## 2020-10-08 ENCOUNTER — Inpatient Hospital Stay: Payer: 59

## 2020-10-08 ENCOUNTER — Ambulatory Visit (INDEPENDENT_AMBULATORY_CARE_PROVIDER_SITE_OTHER): Payer: Self-pay | Admitting: Dentistry

## 2020-10-08 ENCOUNTER — Other Ambulatory Visit: Payer: Self-pay

## 2020-10-08 VITALS — BP 145/84 | HR 76 | Temp 97.3°F | Resp 20 | Ht 69.5 in | Wt 185.9 lb

## 2020-10-08 VITALS — BP 156/87 | HR 97 | Temp 98.4°F

## 2020-10-08 DIAGNOSIS — C113 Malignant neoplasm of anterior wall of nasopharynx: Secondary | ICD-10-CM | POA: Insufficient documentation

## 2020-10-08 DIAGNOSIS — C119 Malignant neoplasm of nasopharynx, unspecified: Secondary | ICD-10-CM

## 2020-10-08 DIAGNOSIS — R5381 Other malaise: Secondary | ICD-10-CM

## 2020-10-08 DIAGNOSIS — K0850 Unsatisfactory restoration of tooth, unspecified: Secondary | ICD-10-CM

## 2020-10-08 DIAGNOSIS — K08109 Complete loss of teeth, unspecified cause, unspecified class: Secondary | ICD-10-CM

## 2020-10-08 DIAGNOSIS — M264 Malocclusion, unspecified: Secondary | ICD-10-CM

## 2020-10-08 DIAGNOSIS — K0510 Chronic gingivitis, plaque induced: Secondary | ICD-10-CM

## 2020-10-08 DIAGNOSIS — C760 Malignant neoplasm of head, face and neck: Secondary | ICD-10-CM

## 2020-10-08 DIAGNOSIS — M270 Developmental disorders of jaws: Secondary | ICD-10-CM

## 2020-10-08 DIAGNOSIS — Z5111 Encounter for antineoplastic chemotherapy: Secondary | ICD-10-CM | POA: Insufficient documentation

## 2020-10-08 DIAGNOSIS — K030 Excessive attrition of teeth: Secondary | ICD-10-CM

## 2020-10-08 DIAGNOSIS — Z01818 Encounter for other preprocedural examination: Secondary | ICD-10-CM

## 2020-10-08 DIAGNOSIS — K029 Dental caries, unspecified: Secondary | ICD-10-CM

## 2020-10-08 DIAGNOSIS — K036 Deposits [accretions] on teeth: Secondary | ICD-10-CM

## 2020-10-08 DIAGNOSIS — K06010 Localized gingival recession, unspecified: Secondary | ICD-10-CM

## 2020-10-08 LAB — CBC WITH DIFFERENTIAL/PLATELET
Abs Immature Granulocytes: 0.01 10*3/uL (ref 0.00–0.07)
Basophils Absolute: 0 10*3/uL (ref 0.0–0.1)
Basophils Relative: 0 %
Eosinophils Absolute: 0 10*3/uL (ref 0.0–0.5)
Eosinophils Relative: 0 %
HCT: 44.5 % (ref 39.0–52.0)
Hemoglobin: 14.2 g/dL (ref 13.0–17.0)
Immature Granulocytes: 0 %
Lymphocytes Relative: 15 %
Lymphs Abs: 0.8 10*3/uL (ref 0.7–4.0)
MCH: 28.8 pg (ref 26.0–34.0)
MCHC: 31.9 g/dL (ref 30.0–36.0)
MCV: 90.3 fL (ref 80.0–100.0)
Monocytes Absolute: 0.3 10*3/uL (ref 0.1–1.0)
Monocytes Relative: 5 %
Neutro Abs: 4.2 10*3/uL (ref 1.7–7.7)
Neutrophils Relative %: 80 %
Platelets: 257 10*3/uL (ref 150–400)
RBC: 4.93 MIL/uL (ref 4.22–5.81)
RDW: 15.1 % (ref 11.5–15.5)
WBC: 5.4 10*3/uL (ref 4.0–10.5)
nRBC: 0 % (ref 0.0–0.2)

## 2020-10-08 LAB — CMP (CANCER CENTER ONLY)
ALT: 16 U/L (ref 0–44)
AST: 16 U/L (ref 15–41)
Albumin: 4.1 g/dL (ref 3.5–5.0)
Alkaline Phosphatase: 96 U/L (ref 38–126)
Anion gap: 11 (ref 5–15)
BUN: 24 mg/dL — ABNORMAL HIGH (ref 8–23)
CO2: 26 mmol/L (ref 22–32)
Calcium: 9.2 mg/dL (ref 8.9–10.3)
Chloride: 106 mmol/L (ref 98–111)
Creatinine: 1.29 mg/dL — ABNORMAL HIGH (ref 0.61–1.24)
GFR, Estimated: >60 mL/min (ref >60)
Glucose, Bld: 110 mg/dL — ABNORMAL HIGH (ref 70–99)
Potassium: 4.1 mmol/L (ref 3.5–5.1)
Sodium: 143 mmol/L (ref 135–145)
Total Bilirubin: 0.5 mg/dL (ref 0.3–1.2)
Total Protein: 9 g/dL — ABNORMAL HIGH (ref 6.5–8.1)

## 2020-10-08 LAB — TSH: TSH: 0.818 u[IU]/mL (ref 0.320–4.118)

## 2020-10-08 MED ORDER — LIDOCAINE-PRILOCAINE 2.5-2.5 % EX CREA: TOPICAL_CREAM | CUTANEOUS | 3 refills | Status: DC

## 2020-10-08 MED ORDER — ONDANSETRON HCL 8 MG PO TABS: 8.0000 mg | ORAL_TABLET | Freq: Two times a day (BID) | ORAL | 1 refills | Status: DC | PRN

## 2020-10-08 MED ORDER — PROCHLORPERAZINE MALEATE 10 MG PO TABS: 10.0000 mg | ORAL_TABLET | Freq: Four times a day (QID) | ORAL | 1 refills | Status: DC | PRN

## 2020-10-08 MED ORDER — LORAZEPAM 0.5 MG PO TABS: 0.5000 mg | ORAL_TABLET | Freq: Four times a day (QID) | ORAL | 0 refills | Status: DC | PRN

## 2020-10-08 MED ORDER — DEXAMETHASONE 4 MG PO TABS: 8.0000 mg | ORAL_TABLET | Freq: Every day | ORAL | 1 refills | Status: DC

## 2020-10-08 NOTE — Progress Notes (Signed)
Department of Dental Medicine     OUTPATIENT CONSULTATION  Service Date:   10/08/2020  Patient Name:  Alan Shearer Sr. Date of Birth:   09/10/1955 Medical Record Number: [id number]  Referring Provider:              Eppie Gibson, MD   PLAN & RECOMMENDATIONS  RECOMMENDATIONS > There are no current signs of acute dental infection including abscess, edema or erythema, or suspicious lesion requiring biopsy.  The patient does have a few cavities and generalized calculus build-up, but there are no current recommendations for dental intervention prior to radiation. >> Recommend that the patient return to our clinic following the completion of radiation therapy for a follow-up appointment and then establish care at a dental office of their choice for routine dental care including replacement of missing teeth, cleanings and exams. >>> Plan to discuss with medical team and coordinate treatment as needed.  Impressions taken for fabrication of upper and lower scatter protection devices.  Plan for patient to return on 4/8 for try-in and delivery. Pending the patient's radiation start date, we also plan to schedule patient for a cleaning before the start of therapy.  >>  Discussed in detail all treatment options with the patient and they are agreeable to the plan.   Thank you for consulting with [facility] and for the opportunity to participate in this patient's treatment.  Should you have any questions or concerns, please contact the [facility] at [phone].   10/08/2020      CONSULT NOTE   COVID 19 SCREENING: The patient denies symptoms concerning for COVID-19 infection including fever, chills, cough, or newly developed shortness of breath.   HISTORY OF PRESENT ILLNESS: >> Alan Arrona Sr. is a very pleasant 65 y.o. 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  The patient presents today for a medically necessary dental consultation as part of their pre-radiation work-up.  DENTAL HISTORY: > The patient reports that it has been about 7 years since he has last seen a dentist.  He is concerned about his teeth falling out after he has radiation treatment.  He currently denies any dental/orofacial pain or sensitivity. >> Patient is able to manage oral secretions.  Patient denies dysphagia, odynophagia, dysphonia, SOB and neck pain.  Patient denies fever, rigors and malaise.   CHIEF COMPLAINT:  Here for a preoperative dental evaluation.   Patient Active Problem List   Diagnosis Date Noted  . 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[place]) 10/04/2020  . Essential hypertension 11/14/2019  . Hyperlipidemia 11/14/2019  . Family history of heart disease 11/14/2019  . Chest pain of uncertain etiology 97/98/9211  . Nonspecific abnormal electrocardiogram (ECG) (EKG) 11/14/2019   No past medical history on file. Past Surgical History:  Procedure Laterality Date  . HERNIA REPAIR    . ROTATOR CUFF REPAIR    . Torn Labrum     No Known Allergies Current Outpatient Medications  Medication Sig Dispense Refill  . amLODipine (NORVASC) 10 MG tablet Take 10 mg by mouth daily.    Marland Kitchen atorvastatin (LIPITOR) 80 MG tablet Take 80 mg by mouth daily.    . cetirizine (ZYRTEC) 10 MG tablet Take 10 mg by mouth daily.    . [employer] D3 25 MCG (1000 UT) capsule Take 1,000 Units by mouth daily.    . nitroGLYCERIN (NITROSTAT) 0.4 MG SL tablet  Place 0.4 mg under the tongue every 5 (five) minutes x 3 doses as needed for chest pain.    Marland Kitchen omeprazole (PRILOSEC) 20 MG capsule Take 20 mg by mouth 2 (two) times daily as needed (acid reflux/indigestion.).    Marland Kitchen sildenafil (VIAGRA) 50 MG tablet Take 50-100 mg by mouth daily as needed for erectile dysfunction.     No current facility-administered medications for this visit.    LABS: Lab Results  Component Value  Date   WBC 5.8 11/20/2015   HGB 15.1 11/20/2015   HCT 45.5 11/20/2015   MCV 91.0 11/20/2015   PLT 246 11/20/2015      Component Value Date/Time   NA 140 11/20/2015 1436   K 4.2 11/20/2015 1436   CL 104 11/20/2015 1436   CO2 25 11/20/2015 1436   GLUCOSE 139 (H) 11/20/2015 1436   BUN 20 11/20/2015 1436   CREATININE 2.36 (H) 11/20/2015 1436   CALCIUM 9.1 11/20/2015 1436   GFRNONAA 29 (L) 11/20/2015 1436   GFRAA 34 (L) 11/20/2015 1436   No results found for: INR, PROTIME No results found for: PTT  Social History   Socioeconomic History  . Marital status: Married    Spouse name: Not on file  . Number of children: Not on file  . Years of education: Not on file  . Highest education level: Not on file  Occupational History  . Not on file  Tobacco Use  . Smoking status: Never Smoker  . Smokeless tobacco: Never Used  Vaping Use  . Vaping Use: Never used  Substance and Sexual Activity  . Alcohol use: Not Currently    Comment: very rarely  . Drug use: No  . Sexual activity: Not Currently  Other Topics Concern  . Not on file  Social History Narrative  . Not on file   Social Determinants of Health   Financial Resource Strain: Not on file  Food Insecurity: Not on file  Transportation Needs: Not on file  Physical Activity: Not on file  Stress: Not on file  Social Connections: Not on file  Intimate Partner Violence: Not on file   No family history on file.   REVIEW OF SYSTEMS: Reviewed with the patient as per HPI. PSYCH: Patient denies having dental phobia.  VITAL SIGNS: BP (!) 156/87 (BP Location: Right Arm)   Pulse 97   Temp 98.4 F (36.9 C) (Oral)    PHYSICAL EXAM: >> General: Well-developed, comfortable and in no apparent distress. >> Neurological: Alert and oriented to person, place and  time. >> Extraoral: Facial asymmetry with notable enlargement of the R side parotid region. TMJ asymptomatic without clicks or crepitations. (+) Bilateral palpable  lymphadenopathy. >> Maximum Interincisal Opening: 48 mm >> Intraoral: Soft tissues appear well-perfused and mucous membranes moist.  FOM and vestibules soft and not raised. Oral cavity without mass or lesion. No signs of infection, parulis, sinus tract, edema or erythema evident upon exam.   (+) Bilateral mandibular tori   DENTAL EXAM: Hard tissue exam completed and charted.  >> Dentition: Overall good remaining dentition.  Missing teeth, caries, existing restorations.   >> The patient is maintaining fair oral hygiene.  >> Periodontal: Pink, healthy gingival tissue with blunted papilla. Generalized calculus accumulation on interproximal surfaces and lower anterior teeth. Localized gingival recession teeth numbers 1B, 2B, 15B, 19B and 30DB. >> Caries: #1 occlusal, 2 distal, #17 occlusal, #19 buccal and #32 occlusal. >> Defective Restorations: #18B amalgam has recurrent decay around margins. >> Endodontics: #8 previous  root canal therapy >> Removable/Fixed Prosthodontics: #8 full-coverage PFM crown with good marginal retention. >> Occlusion: Class I molar occlusion on L side; mandible seats to the left of maxilla with crossbite of lower left canine positioned facially.  Non-functional teeth #2 and #17. Supra-erupted tooth #2. >> Other findings: Attrition/wear on incisal surfaces of #7, #9, #10 and #23- #26.   RADIOGRAPHIC EXAM: PAN and Full Mouth Series exposed and interpreted.  >> Condyles seated bilaterally in fossas.  No evidence of abnormal pathology.  All visualized osseous structures appear WNL. #2 supra-erupted, #32 mesially inclined.  >> Generalized mild horizontal bone loss with areas of localized moderate consistent with mild to moderate periodontitis vs gingival recession.  Radiographic calculus accumulation present. >> Missing teeth #16 and #31. Caries- #2D. Existing restorations on #2 and #18, full-coverage crown on tooth #8 that has been previously endodontically treated- gutta  percha fill appears to be exactly to apex and adequate. Bilateral radiopacities superimposing lower canines consistent with tori.   ASSESSMENT:  1. 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 Preoperative dental consultation 3. Missing teeth 4. Accretions on teeth 5. Gingival recession 6. Caries 7. Gingivitis 8. Attrition/wear 9. Defective dental restoration 10. 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 Upper and Lower alginate impressions taken and poured up in Type IV Microstone for fabrication of scatter protection devices and flouride trays (for after radiation).   2. Trismus appliance made using patient's baseline MIO (26 sticks).  Leta Speller, DAII demonstrated use of appliance.  Verbal and written postop instructions were given to the patient. 3. The common and significant side effects of radiation therapy to the head and neck were explained and discussed with the patient.  The discussion included side effects of trismus (limited opening), dysgeusia (loss of taste), xerostomia (dry mouth), radiation caries and osteoradionecrosis of the jaw.  I also discussed the importance of maintaining optimal oral hygiene and oral health before, during and after radiation to decrease the risk of developing radiation cavities and the need for any surgery such as extractions after therapy.     PLAN AND RECOMMENDATIONS: >  I discussed the risks, benefits, and complications of various scenarios with the patient in relationship to their medical and dental conditions, which included systemic infection or other serious issues such as osteoradionecrosis that could potentially occur either before, during or after their anticipated radiation therapy if dental/oral concerns are not addressed.  I explained that if any chronic or acute dental/oral infection(s) are addressed and subsequently not maintained following medical optimization and recovery, their risk of the previously mentioned  complications are just as high and could potentially occur postoperatively.  I explained all significant findings of the dental consultation with the patient including a few smaller cavities and generalized tartar or calculus build-up on his teeth, and the recommended care including a cleaning and subsequent routine dental care (fillings, cleanings and exams) following radiation treatment in order to optimize them for radiation from a dental standpoint.  The patient verbalized understanding of all findings, discussion, and recommendations. >>  We then discussed various treatment options to include no treatment, multiple extractions with alveoloplasty, pre-prosthetic surgery as indicated, periodontal therapy, dental restorations, root canal therapy, crown and bridge therapy, implant therapy, and replacement of missing teeth as indicated.  The patient verbalized understanding of all options, and currently wishes to proceed with a cleaning prior to starting radiation pending medical team's recommendations and establishing care at a dental office of his choice for routine dental care. >>>  Plan to discuss all findings and recommendations with medical team and coordinate future care as needed.  Scheduled an appointment on Friday 4/8 for delivery of scatter protection devices, and plan to schedule cleaning at that time.  <> The patient tolerated today's visit well.  All questions and concerns were addressed and answered, and the patient departed in stable condition.   I spent in excess of 120 minutes during the conduct of this consultation and >50% of this time involved direct face-to-face encounter for counseling and/or coordination of the patient's care. [place] Benson [place], D.M.D.

## 2020-10-08 NOTE — Progress Notes (Signed)
START ON PATHWAY REGIMEN - Head and Neck   Cisplatin 40 mg/m2 IV D1 q7 Days + RT:   A cycle is every 7 days:     Cisplatin   **Always confirm dose/schedule in your pharmacy ordering system**  Cisplatin 80 mg/m2 IV D1 + Fluorouracil 1,000 mg/m2/day CIV D1,2,3,4 q28 Days:   A cycle is every 28 days:     Cisplatin      Fluorouracil   **Always confirm dose/schedule in your pharmacy ordering system**  Patient Characteristics: Nasopharyngeal, Stage II - IVA Disease Classification: Nasopharyngeal Current Disease Status: No Distant Metastases and No Recurrent Disease AJCC T Category: T1 AJCC N Category: N3 AJCC M Category: M0 AJCC 8 Stage Grouping: IVA Intent of Therapy: Curative Intent, Discussed with Patient

## 2020-10-08 NOTE — Patient Instructions (Signed)
[place] Benson Norway, D.M.D. Phone: [phone] Fax: [phone]   It was a pleasure seeing you today!  Please refer to the information below regarding your dental visit with [place], and call us should you have any questions or concerns that may come up after you leave.   Thank you for giving [place] the opportunity to provide care for you.  If there is anything we can do for you, please let us know.     RADIATION THERAPY AND INFORMATION REGARDING YOUR TEETH   . XEROSTOMIA (DRY MOUTH):  Your salivary glands may be in the field of radiation.  Radiation may include all or only part of your salivary glands.  This will cause your saliva to dry up, and you will have a dry mouth.  The dry mouth will be for the rest of your life unless your radiation oncologist tells you otherwise.  Your saliva has many functions: 1. It wets your tongue for speaking. 2. It coats your teeth and the inside of your mouth for easier movement. 3. It helps with chewing and swallowing food. 4. It helps clean away harmful acid and toxic products made by the germs in your mouth, therefore it helps prevent cavities. 5. It kills some germs in your mouth and helps to prevent gum disease. 6. It helps to carry flavor to your taste buds.  >> Once you have lost your saliva, you will be at higher risk for tooth decay and gum disease.    What can be done to help improve your mouth when there's not enough saliva: >> Your dentist may give a prescription for Salagen.  It will not bring back all of your saliva but may bring back some of it.  Also, your saliva may be thick and ropy or white and foamy. It will not feel like it use to feel. >> You will need to swish with water every time your mouth feels dry.  YOU CANNOT suck on any cough drops, mints, lemon drops, candy, vitamin C or any other products.  You cannot use anything other than water to make your mouth feel less dry.  If you want to  drink anything else, you have to drink it all at once and brush afterwards.  Be sure to discuss the details of your diet habits with your dentist or hygienist.   . RADIATION CARIES:  This is decay (cavities) that happens very quickly once your mouth is very dry due to radiation therapy.  Normally, cavities take six months to two years to become a problem.  When you have dry mouth, cavities may take as little as eight weeks to cause you a problem.    >> Dental check-ups every two months are necessary as long as you have a dry mouth. Radiation caries typically, but not always, start at your gum line where it is hard to see the cavity.  It is therefore also hard to fill these cavities adequately.  This high rate of cavities happens because your mouth no longer has saliva and therefore the acid made by the germs starts the decay process.  Whenever you eat anything the germs in your mouth change the food into acid.  The acid then burns a small hole in your tooth.  This small hole is the beginning of a cavity.  If this is not treated then it will grow bigger and become a cavity.  The way to avoid this hole getting bigger is to use fluoride  every evening as prescribed by your dentist following your radiation.   NOTE:  You have to make sure that your teeth are very clean before you use the fluoride.  This fluoride in turn will strengthen your teeth and prepare them for another day of fighting acid.  >> If you develop radiation caries many times, the damage is so large that you will have to have all your teeth removed.  This could be a big problem if some of these teeth are in the field of radiation.  Further details of why this could be a big problem will follow (see Osteoradionecrosis below).   . DYSGEUSIA (LOSS OF TASTE): This happens to varying degrees once you've had radiation therapy to your jaw region.  Many times taste is not completely lost, but becomes limited.  The loss of taste is mostly due to  radiation affecting your taste buds.  However, if you have no saliva in your mouth to carry the flavor to your taste buds, it would be difficult for your taste buds to taste anything.  That is why using water or a prescription for Salagen prior to meals and during meal times may help with some of the taste.  Keep in mind that taste generally returns very slowly over the course of several months or several years after radiation therapy.  Don't give up hope.   . TRISMUS (LIMITED JAW OPENING): According to your Radiation Oncologist, your TMJ or jaw joints are going to be partially or fully in the field of radiation.  This means that over time the muscles that help you open and close your mouth may get stiff.  This will potentially result in your not being able to open your mouth wide enough or as wide as you can open it now.    Let me give you an example of how slowly this happens and how unaware people are of it:   >> A gentlemen that had radiation therapy two years ago came back to me complaining that bananas are just too large for him to be able to fit them in between his teeth.  He was not able to open wide enough to bite into a banana.  This happens slowly and over a period of time.  What we do to try and prevent this:   1. Your dentist will probably give you a stack of sticks called a trismus exercise device.  This stack will help remind your muscles and your jaw joints to open up to the same distance every day.  Use these sticks every morning when you wake up, or according to the instructions given by your dentist.    2. You must use these sticks for at least one to two years after radiation therapy.  The reason for that is because it happens so slowly and keeps going on for about two years after radiation therapy.  Your hospital dentist will help you monitor your mouth opening and make sure that it's not getting smaller after radiation.  TRISMUS EXERCISES: >> Using the stack of sticks given to you  by your dentist, place the stack in your mouth and hold onto the other end for support. >> Leave the sticks in your mouth while holding the other end.  Allow 30 seconds for muscle stretching. >> Rest for a few seconds. >> Repeat 3-5 times. >> This exercise is recommended in the mornings and evenings unless otherwise instructed. >> The exercise should be done for a period of 2 YEARS  after the end of radiation. >> Your maximum jaw opening should be checked routinely at recall dental visits by your general dentist. >> You should report any changes, soreness, or difficulties encountered when doing the exercises to your dentist.   . OSTEORADIONECROSIS (ORN): This is a condition where your jaw bone after radiation therapy becomes very dry.  It has very little blood supply to keep it alive.  If you develop a cavity that turns into an abscess or an infection, then the jaw bone does not have enough blood supply to help fight the infection.  At this point it is very likely that the infection could cause the death of your jaw bone.  When you have dead bone it has to be removed.  Therefore, you might end up having to have surgery to remove part of your jaw bone, the part of the jaw bone that has been affected.     >> Healing is also a problem if you are to have surgery (like a tooth extraction) in the areas where the bone has had radiation therapy.  If you have surgery, you need more blood supply to heal which is not available.  When blood supply and oxygen are not available, there is a chance for the bone to die. >> Occasionally, ORN happens on its own with no obvious reason, but this is quite rare.  We believe that patients who continue to smoke and/or drink alcohol have a higher chance of having this problem. >> Once your jaw bone has had radiation therapy, if there are any remaining teeth in that area, it is not recommended to have them pulled unless your dentist or oral surgeon is aware of your history of  radiation and believes it is safe.  >> The risks for ORN either from infection or spontaneously occurring (with no reason) are life long.   QUESTIONS?  Call our office during office hours [phone].

## 2020-10-08 NOTE — Progress Notes (Signed)
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 Since he is EBV negative, there is no clear role of induction chemotherapy.  I would recommend proceeding with concurrent chemoradiation.  We have discussed about cisplatin as a choice of chemotherapy.  We have discussed about adverse effects with cisplatin including but not limited to fatigue, nausea, vomiting, increased risk of infections, ototoxicity, nephrotoxicity some neuropathy.  We have discussed that some of the side effects can be permanent. We have discussed about the Intergroup 0099 trial which compared chemoradiation plus adjuvant chemotherapy versus RT alone.  In this trial chemotherapy was used at a dose of 100 mg per metered squared on days 1, 22 and 43 and post radiotherapy chemotherapy with cisplatin and 5-FU were administered every 4 weeks for 3 courses.  Patient was extremely worried about doing high-dose cisplatin given the toxicity and was quite overwhelmed with the possible side effects, repeatedly questioned the prognosis without doing any treatment since he was so worried about the treatment itself.  He is also worried that he does not have much family here who could support him during his treatment.  We also talked about the face to a child with local regionally advanced nasopharyngeal cancer patients were randomly assigned to either bolus  cisplatin for 2 cycles of weekly cisplatin for 6 cycles concurrently with IMRT.  I have discussed that there is no significant difference in 3-year failure free survival..  On this trial suggested a nonsignificant trend towards worsening toxicity with weekly dosing but in general weekly dosing is well-tolerated compared to bolus dosing.  We have discussed about these findings.  He is much more comfortable with weekly dosing at this time.  He understands that there is no head-to-head comparison between these 2 regimens and if there is any superiority of bolus dosing versus weekly dosing.  Again he had multiple questions about the benefit of chemotherapy..  We have discussed about general response rate with concurrent chemoradiation, overall survival in the group with concurrent chemoradiation. He again got overwhelmed when we discussed about role of adjuvant chemotherapy. We will discuss this later again once he starts treatment.  He understands he may need salvage surgery if he doesn't have complete response. Will return to clinic in approximately 2 weeks. Chest port and PEG tube will be ordered. We will have to coordinate the dates of treatment based on lab radiation oncology schedule.     Orders Placed This Encounter  Procedures  . IR IMAGING GUIDED PORT INSERTION    Standing Status:   Future    Standing Expiration Date:   10/08/2021    Order Specific Question:   Reason for Exam (SYMPTOM  OR DIAGNOSIS REQUIRED)    Answer:   chemotherapy adminsitration    Order Specific Question:   Preferred Imaging Location?    Answer:   [facility]  . IR Gastrostomy Tube    Standing Status:   Future    Standing Expiration Date:   10/08/2021    Order Specific Question:  Reason for exam:    Answer:   patient will undergo concurrent chemoradiation for nasopharyngeal cancer    Order Specific Question:   Preferred Imaging Location?    Answer:   [facility]  . CBC with Differential/Platelet     Standing Status:   Standing    Number of Occurrences:   22    Standing Expiration Date:   10/08/2021  . CMP ([place] only)    Standing Status:   Future    Number of Occurrences:   1    Standing Expiration Date:   10/08/2021     HISTORY OF PRESENTING ILLNESS:   Alan Muccio Sr. 65 y.o. male is here because of nasopharyngeal cancer.  Alan Hinely Sr. is a 64 y.o. male who presented with six-month history of gradually enlarging bilateral lymph nodes with associated mild discomfort and pressure.  Subsequently, the patient saw Dr. Wilburn Cornelia, who recommended CT scan of neck and ultrasound-guided needle core biopsy for soft tissue diagnosis.  Biopsy of right neck lymph node on 08/30/2020 revealed: squamous cell carcinoma, p16 positive. EBV ordered, negative.  He had CT soft tissue neck done on August 07, 2020 which showed nasopharyngeal soft tissue prominence, greatest soft tissue effacement of adjacent right parapharyngeal fat suspected to be at least enlarged right retropharyngeal lymph node.  Bulky bilateral cervical midline likely right intraparotid lymphadenopathy  PET/CT scan shows intense FDG uptake with area of increased soft tissue fullness in the posterior nasopharynx.  Extensive bulky bilateral FDG avid cervical adenopathy.  Large FDG avid lymph node also identified within the right parotid gland.  Imaging findings compatible with metastatic adenopathy.  Subcentimeter right supraclavicular lymph node exhibits mild FDG uptake of low background activity equivocal for nodal metastasis.  No additional signs of metastatic disease.  He had  right neck lymph node biopsy which is positive for squamous cell carcinoma, p16 positive, EBV negative  He is here for an initial visit.  He denies any painful swallowing.  He does feel pressure in his right ear and pressure in his neck.  He denies any changes in his breathing.  He is a never smoker.  No change in bowel or urinary  habits. He had history of nephrolithiasis and acute kidney injury which according to the patient has resolved completely.  Rest of the pertinent 10 point ROS reviewed and negative.  REVIEW OF SYSTEMS:   Constitutional: Denies fevers, chills or abnormal night sweats Eyes: Denies blurriness of vision, double vision or watery eyes Ears, nose, mouth, throat, and face: Denies mucositis or sore throat Respiratory: Denies cough, dyspnea or wheezes Cardiovascular: Denies palpitation, chest discomfort or lower extremity swelling Gastrointestinal:  Denies nausea, heartburn or change in bowel habits Skin: Denies abnormal skin rashes Lymphatics: Denies new lymphadenopathy or easy bruising Neurological:Denies numbness, tingling or new weaknesses Behavioral/Psych: Mood is stable, no new changes  All other systems were reviewed with the patient and are negative.  MEDICAL HISTORY:   No past medical history on file.  SURGICAL HISTORY: Past Surgical History:  Procedure Laterality Date  . HERNIA REPAIR    . ROTATOR CUFF REPAIR    . Torn Labrum      SOCIAL HISTORY: Social History   Socioeconomic History  . Marital status: Married    Spouse name: Not on file  . Number of children: Not on file  . Years of education: Not on file  . Highest education level: Not on file  Occupational History  . Not on file  Tobacco Use  . Smoking status: Never Smoker  . Smokeless tobacco: Never Used  Vaping Use  . Vaping Use: Never used  Substance and Sexual Activity  . Alcohol use: Not Currently    Comment: very rarely  . Drug use: No  . Sexual activity: Not Currently  Other Topics Concern  . Not on file  Social History Narrative  . Not on file   Social Determinants of Health   Financial Resource Strain: Not on file  Food Insecurity: Not on file  Transportation Needs: Not on file  Physical Activity: Not on file  Stress: Not on file  Social Connections: Not on file  Intimate Partner Violence:  Not on file    FAMILY HISTORY: No family history on file.  ALLERGIES:  has No Known Allergies.  MEDICATIONS:  Current Outpatient Medications  Medication Sig Dispense Refill  . amLODipine (NORVASC) 10 MG tablet Take 10 mg by mouth daily.    Marland Kitchen atorvastatin (LIPITOR) 80 MG tablet Take 80 mg by mouth daily.    . cetirizine (ZYRTEC) 10 MG tablet Take 10 mg by mouth daily.    . [employer] D3 25 MCG (1000 UT) capsule Take 1,000 Units by mouth daily.    Marland Kitchen omeprazole (PRILOSEC) 20 MG capsule Take 20 mg by mouth 2 (two) times daily as needed (acid reflux/indigestion.).    Marland Kitchen sildenafil (VIAGRA) 50 MG tablet Take 50-100 mg by mouth daily as needed for erectile dysfunction.    . nitroGLYCERIN (NITROSTAT) 0.4 MG SL tablet Place 0.4 mg under the tongue every 5 (five) minutes x 3 doses as needed for chest pain.     No current facility-administered medications for this visit.     PHYSICAL EXAMINATION:  ECOG PERFORMANCE STATUS: 0 - Asymptomatic  Vitals:   10/08/20 1126  BP: (!) 145/84  Pulse: 76  Resp: 20  Temp: (!) 97.3 F (36.3 C)  SpO2: 99%   Filed Weights   10/08/20 1126  Weight: 185 lb 14.4 oz (84.3 kg)    GENERAL:alert, no distress and comfortable SKIN: skin color, texture, turgor are normal, no rashes or significant lesions EYES: normal, conjunctiva are pink and non-injected, sclera clear OROPHARYNX:no exudate, no erythema and lips, buccal mucosa, and tongue normal  NECK: Large bulky bilateral lymphadenopathy noted in the neck region.  I could not palpate supraclavicular lymph nodes LYMPH:  no palpable lymphadenopathy axillary or inguinal LUNGS: clear to auscultation and percussion with normal breathing effort HEART: regular rate & rhythm and no murmurs and no lower extremity edema ABDOMEN:abdomen soft, non-tender and normal bowel sounds Musculoskeletal:no cyanosis of digits and no clubbing  PSYCH: alert & oriented x 3 with fluent speech NEURO: no focal motor/sensory  deficits  LABORATORY DATA:  I have reviewed the data as listed Lab Results  Component Value Date   WBC 5.4 10/08/2020   HGB 14.2 10/08/2020   HCT 44.5 10/08/2020   MCV 90.3 10/08/2020   PLT 257 10/08/2020     Chemistry      Component Value Date/Time   NA 143 10/08/2020 1250   K 4.1 10/08/2020 1250   CL 106 10/08/2020 1250   CO2 26 10/08/2020 1250   BUN 24 (H) 10/08/2020 1250   CREATININE 1.29 (H) 10/08/2020 1250      Component Value Date/Time   CALCIUM 9.2 10/08/2020 1250   ALKPHOS 96 10/08/2020 1250   AST 16 10/08/2020 1250   ALT 16 10/08/2020 1250   BILITOT 0.5 10/08/2020 1250  RADIOGRAPHIC STUDIES: I have personally reviewed the radiological images as listed and agreed with the findings in the report. NM PET Image Initial (PI) Skull Base To Thigh  Result Date: 09/28/2020 CLINICAL DATA:  Initial treatment strategy for squamous cell carcinoma of the head and neck. EXAM: NUCLEAR MEDICINE PET SKULL BASE TO THIGH TECHNIQUE: 10.77 mCi F-18 FDG was injected intravenously. Full-ring PET imaging was performed from the skull base to thigh after the radiotracer. CT data was obtained and used for attenuation correction and anatomic localization. Fasting blood glucose: 113 mg/dl COMPARISON:  CT neck 08/07/2020 FINDINGS: Mediastinal blood pool activity: SUV max 3.08 Liver activity: SUV max NA NECK: Intense FDG uptake corresponding to increased posterior nasal pharyngeal soft tissue has an SUV max of 17.29, image 15/4. Bulky bilateral FDG avid cervical lymph nodes are identified, including: Right retropharyngeal lymph node measures 1.7 cm and has an SUV max of 14.78, image 17/4. Right parotid lymph node measures 2.6 cm within SUV max of 9.69. Right level 2 node measures 3.1 cm and has an SUV max of 15.8, image 25/4. Right level 3/4 node measures 3.7 cm and has an SUV max of 15.34, image 42/4. Left level 2 node measures 3.2 cm and has an SUV max of 18.2. Incidental CT findings: none  CHEST: Right supraclavicular node measures 0.7 cm within SUV max of 3.7, image 50/4. No FDG avid axillary, mediastinal or hilar lymph nodes. Incidental CT findings: No suspicious or FDG avid pulmonary nodules. Tiny nodule in the lateral right apex measures 3 mm and is too small to characterize by PET-CT, image 10/8. Aortic atherosclerosis. Coronary artery calcifications. ABDOMEN/PELVIS: No abnormal FDG uptake within the liver, pancreas, or spleen. No abnormal uptake within the adrenal glands. No hypermetabolic abdominopelvic lymph nodes. Incidental CT findings: Aortic atherosclerosis. Bilateral renal calculi. No hydronephrosis. SKELETON: No focal hypermetabolic activity to suggest skeletal metastasis. Incidental CT findings: none IMPRESSION: 1. There is intense FDG uptake within the area of increased soft tissue fullness in the posterior nasopharynx. Cannot exclude primary nasopharyngeal neoplasm. 2. Extensive, bulky bilateral FDG avid cervical adenopathy. Large FDG avid lymph node is also identified within the right parotid gland. Imaging findings compatible with metastatic adenopathy. 3. Subcentimeter right supraclavicular lymph node exhibits mild FDG uptake above background activity. Equivocal for nodal metastasis. No additional signs of thoracic, abdominal, or pelvic metastasis. No evidence for osseous metastatic disease. Electronically Signed   By: Kerby Moors M.D.   On: 09/28/2020 18:24   VAS [place] LOWER EXTREMITY VENOUS (DVT)  Result Date: 10/04/2020  Lower Venous DVT Study Indications: History of swelling LT knee/calf.  Risk Factors: Cancer nasopharyngeal soft palate. Comparison Study: No prior studies. Performing Technologist: Darlin Coco RDMS,RVT  Examination Guidelines: A complete evaluation includes B-mode imaging, spectral Doppler, color Doppler, and power Doppler as needed of all accessible portions of each vessel. Bilateral testing is considered an integral part of a complete examination. Limited  examinations for reoccurring indications may be performed as noted. The reflux portion of the exam is performed with the patient in reverse Trendelenburg.  +-----+---------------+---------+-----------+----------+--------------+ RIGHTCompressibilityPhasicitySpontaneityPropertiesThrombus Aging +-----+---------------+---------+-----------+----------+--------------+ CFV  Full           Yes      Yes                                 +-----+---------------+---------+-----------+----------+--------------+   +---------+---------------+---------+-----------+----------+--------------+ LEFT     CompressibilityPhasicitySpontaneityPropertiesThrombus Aging +---------+---------------+---------+-----------+----------+--------------+ CFV      Full  Yes      Yes                                 +---------+---------------+---------+-----------+----------+--------------+ SFJ      Full                                                        +---------+---------------+---------+-----------+----------+--------------+ FV Prox  Full                                                        +---------+---------------+---------+-----------+----------+--------------+ FV Mid   Full                                                        +---------+---------------+---------+-----------+----------+--------------+ FV DistalFull                                                        +---------+---------------+---------+-----------+----------+--------------+ PFV      Full                                                        +---------+---------------+---------+-----------+----------+--------------+ POP      Full           Yes      Yes                                 +---------+---------------+---------+-----------+----------+--------------+ PTV      Full                                                         +---------+---------------+---------+-----------+----------+--------------+ PERO     Full                                                        +---------+---------------+---------+-----------+----------+--------------+     Summary: RIGHT: - No evidence of common femoral vein obstruction.  LEFT: - There is no evidence of deep vein thrombosis in the lower extremity.  - No cystic structure found in the popliteal fossa.  *See table(s) above for measurements and observations. Electronically signed by Monica Martinez MD on 10/04/2020 at 4:54:22 PM.    Final     All questions were answered. The patient  knows to call the clinic with any problems, questions or concerns. I spent 75 minutes in the care of this patient including H and P, review of records, counseling and coordination of care.     Benay Pike, MD 10/08/2020 3:08 PM

## 2020-10-08 NOTE — Assessment & Plan Note (Signed)
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 Since he is EBV negative, there is no clear role of induction chemotherapy.  I would recommend proceeding with concurrent chemoradiation.  We have discussed about cisplatin as a choice of chemotherapy.  We have discussed about adverse effects with cisplatin including but not limited to fatigue, nausea, vomiting, increased risk of infections, ototoxicity, nephrotoxicity some neuropathy.  We have discussed that some of the side effects can be permanent. We have discussed about the Intergroup 0099 trial which compared chemoradiation plus adjuvant chemotherapy versus RT alone.  In this trial chemotherapy was used at a dose of 100 mg per metered squared on days 1, 22 and 43 and post radiotherapy chemotherapy with cisplatin and 5-FU were administered every 4 weeks for 3 courses.  Patient was extremely worried about doing high-dose cisplatin given the toxicity and was quite overwhelmed with the possible side effects, repeatedly questioned the prognosis without doing any treatment since he was so worried about the treatment itself.  He is also worried that he does not have much family here who could support him during his treatment.  We also talked about the face to a child with local regionally advanced nasopharyngeal cancer patients were randomly assigned to either bolus cisplatin for 2 cycles of weekly cisplatin for 6 cycles concurrently with IMRT.  I have discussed that there is no significant difference in 3-year failure free survival..  On this trial suggested a nonsignificant trend towards worsening toxicity with weekly dosing but in general weekly dosing is well-tolerated compared to bolus  dosing.  We have discussed about these findings.  He is much more comfortable with weekly dosing at this time.  He understands that there is no head-to-head comparison between these 2 regimens and if there is any superiority of bolus dosing versus weekly dosing.  Again he had multiple questions about the benefit of chemotherapy..  We have discussed about general response rate with concurrent chemoradiation, overall survival in the group with concurrent chemoradiation. He again got overwhelmed when we discussed about role of adjuvant chemotherapy. We will discuss this later again once he starts treatment.  He understands he may need salvage surgery if he doesn't have complete response. Will return to clinic in approximately 2 weeks. Chest port and PEG tube will be ordered. We will have to coordinate the dates of treatment based on lab radiation oncology schedule.

## 2020-10-10 ENCOUNTER — Telehealth (HOSPITAL_COMMUNITY): Payer: Self-pay

## 2020-10-10 ENCOUNTER — Other Ambulatory Visit: Payer: Self-pay

## 2020-10-10 ENCOUNTER — Other Ambulatory Visit: Payer: Self-pay | Admitting: Hematology and Oncology

## 2020-10-10 DIAGNOSIS — C113 Malignant neoplasm of anterior wall of nasopharynx: Secondary | ICD-10-CM

## 2020-10-10 NOTE — Telephone Encounter (Signed)
-----   Message from Corrie Mckusick, DO sent at 10/09/2020  5:26 PM EDT ----- Regarding: RE: peg/port placement I called the physician on this.    We cannot do the gastrostomy.  Would recommend surgical consult, and surgery might just want to do both cases.    Will remove from the list for now, waiting on call back from referring doctors.  Earleen Newport  ----- Message ----- From: Danielle Dess Sent: 10/09/2020   2:59 PM EDT To: [facility] Procedure Requests Subject: peg/port placement                             Procedure: Peg/port placement  Dx: nasopharynx cancer  Ordering: Dr. Chryl Heck [facility] [phone])  Imaging: NM Pet 09/28/20 in epic  Please review.   Thanks,  Lia Foyer

## 2020-10-11 ENCOUNTER — Ambulatory Visit
Admission: RE | Admit: 2020-10-11 | Discharge: 2020-10-11 | Disposition: A | Discharge status: Home / Self Care | Payer: 59 | Source: Ambulatory Visit | Attending: Radiation Oncology | Admitting: Radiation Oncology

## 2020-10-11 ENCOUNTER — Other Ambulatory Visit: Payer: Self-pay

## 2020-10-11 ENCOUNTER — Ambulatory Visit (INDEPENDENT_AMBULATORY_CARE_PROVIDER_SITE_OTHER): Payer: Dental | Admitting: Dentistry

## 2020-10-11 VITALS — BP 130/83 | HR 70 | Temp 98.3°F

## 2020-10-11 VITALS — BP 122/80 | HR 75 | Temp 99.0°F | Resp 17

## 2020-10-11 DIAGNOSIS — Z463 Encounter for fitting and adjustment of dental prosthetic device: Secondary | ICD-10-CM

## 2020-10-11 DIAGNOSIS — C118 Malignant neoplasm of overlapping sites of nasopharynx: Secondary | ICD-10-CM | POA: Insufficient documentation

## 2020-10-11 DIAGNOSIS — Z01818 Encounter for other preprocedural examination: Secondary | ICD-10-CM

## 2020-10-11 DIAGNOSIS — Z510 Encounter for antineoplastic radiation therapy: Secondary | ICD-10-CM | POA: Diagnosis present

## 2020-10-11 DIAGNOSIS — C113 Malignant neoplasm of anterior wall of nasopharynx: Secondary | ICD-10-CM | POA: Insufficient documentation

## 2020-10-11 DIAGNOSIS — C119 Malignant neoplasm of nasopharynx, unspecified: Secondary | ICD-10-CM

## 2020-10-11 MED ORDER — SODIUM CHLORIDE 0.9% FLUSH
10.0000 mL | Freq: Once | INTRAVENOUS | Status: DC
Start: 2020-10-11 — End: 2020-10-12

## 2020-10-11 NOTE — Progress Notes (Signed)
Has armband been applied?  Yes.    Does patient have an allergy to IV contrast dye?: No.   Has patient ever received premedication for IV contrast dye?: No.   Does patient take metformin?: No.  Date of lab work: October 08, 2020 BUN: 24 CR: 1.29  IV site: forearm right, condition patent and no redness  Has IV site been added to flowsheet?  Yes.    BP 122/80 (BP Location: Left Arm, Patient Position: Sitting)   Pulse 75   Temp 99 F (37.2 C)   Resp 17   SpO2 100%

## 2020-10-11 NOTE — Progress Notes (Signed)
Department of Dental Medicine     DELIVERY: SCATTER PROTECTION DEVICES  Service Date:   10/11/2020  Patient Name:  Alan Ellwood Sr. Date of Birth:   01/16/1956 Medical Record Number: [id number]  Referring Provider:              Eppie Gibson, MD   PLAN & RECOMMENDATIONS   > Delivered upper and lower SPDs today. >> Recommend the patient return to our clinic after the completion of radiation therapy for a follow-up appointment and then establish care at a dental office of his choice for routine dental care.  >>> Plan to follow-up s/p radiation therapy.  >>  Discussed in detail all treatment options with the patient and they are agreeable to the plan.   10/11/2020     PROGRESS NOTE   COVID 19 SCREENING: The patient denies symptoms concerning for COVID-19 infection including fever, chills, cough, or newly developed shortness of breath.   HISTORY OF PRESENT ILLNESS: > Alan Grays Sr. presents today for delivery of upper and lower scatter protection devices.  His simulation is also scheduled this morning. >> Medical and dental history reviewed with the patient.  No changes reported.  CHIEF COMPLAINT:  No complaints.   Patient Active Problem List   Diagnosis Date Noted  . 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 Essential hypertension 11/14/2019  . Hyperlipidemia 11/14/2019  . Family history of heart disease 11/14/2019  . Chest pain of uncertain etiology 52/77/8242  . Nonspecific abnormal electrocardiogram (ECG) (EKG) 11/14/2019   No past medical history on file. Current Outpatient Medications  Medication Sig Dispense Refill  . amLODipine (NORVASC) 10 MG tablet Take 10 mg by mouth daily.    Marland Kitchen atorvastatin (LIPITOR) 80 MG tablet Take 80 mg by mouth daily.    . cetirizine (ZYRTEC) 10 MG tablet Take 10 mg by mouth daily.    . [employer] D3 25 MCG (1000 UT) capsule Take 1,000 Units by mouth daily.    Marland Kitchen dexamethasone  (DECADRON) 4 MG tablet Take 2 tablets (8 mg total) by mouth daily. Take daily x 3 days starting the day after cisplatin chemotherapy. Take with food. 30 tablet 1  . lidocaine-prilocaine (EMLA) cream Apply to affected area once 30 g 3  . LORazepam (ATIVAN) 0.5 MG tablet Take 1 tablet (0.5 mg total) by mouth every 6 (six) hours as needed (Nausea or vomiting). 30 tablet 0  . nitroGLYCERIN (NITROSTAT) 0.4 MG SL tablet Place 0.4 mg under the tongue every 5 (five) minutes x 3 doses as needed for chest pain.    Marland Kitchen omeprazole (PRILOSEC) 20 MG capsule Take 20 mg by mouth 2 (two) times daily as needed (acid reflux/indigestion.).    Marland Kitchen ondansetron (ZOFRAN) 8 MG tablet Take 1 tablet (8 mg total) by mouth 2 (two) times daily as needed. Start on the third day after cisplatin chemotherapy. 30 tablet 1  . prochlorperazine (COMPAZINE) 10 MG tablet Take 1 tablet (10 mg total) by mouth every 6 (six) hours as needed (Nausea or vomiting). 30 tablet 1  . sildenafil (VIAGRA) 50 MG tablet Take 50-100 mg by mouth daily as needed for erectile dysfunction.     No current facility-administered medications for this visit.   No Known Allergies   VITALS: BP 130/83 (BP Location: Right Arm)   Pulse 70   Temp 98.3 F (36.8 C) (Oral)    ASSESSMENT: Patient with anticipated radiation therapy to the head and neck.  1. Nasopharynx cancer 2. Preoperative dental examination   PROCEDURES: 1. Appliances were tried in and adjusted as needed. Polished. 2.   Postoperative instructions were provided in a verbal format concerning the use and care of appliances.  SPDs were given to the patient to bring down with him to his simulation appointment this morning per the radiation oncology team.   PLAN: > Patient to return to our clinic following the completion of radiation therapy for a follow-up appointment.  He will then establish care at an outside dental office of their choice for routine dental care including cleanings and  exams. >> Patient instructed to call if questions or problems arise before then.   <>  All questions and concerns were invited and addressed.  The patient tolerated today's visit well and departed in stable condition.  Montrose Benson Norway, DMD

## 2020-10-14 ENCOUNTER — Other Ambulatory Visit: Payer: Self-pay

## 2020-10-14 DIAGNOSIS — C113 Malignant neoplasm of anterior wall of nasopharynx: Secondary | ICD-10-CM

## 2020-10-15 ENCOUNTER — Encounter: Payer: Self-pay | Admitting: Hematology and Oncology

## 2020-10-15 ENCOUNTER — Other Ambulatory Visit: Payer: Self-pay

## 2020-10-15 ENCOUNTER — Inpatient Hospital Stay: Payer: 59 | Admitting: Hematology and Oncology

## 2020-10-15 ENCOUNTER — Telehealth: Payer: Self-pay | Admitting: Hematology and Oncology

## 2020-10-15 ENCOUNTER — Other Ambulatory Visit (HOSPITAL_COMMUNITY): Payer: 59

## 2020-10-15 ENCOUNTER — Encounter: Payer: Self-pay | Admitting: *Deleted

## 2020-10-15 DIAGNOSIS — C113 Malignant neoplasm of anterior wall of nasopharynx: Secondary | ICD-10-CM | POA: Diagnosis not present

## 2020-10-15 DIAGNOSIS — Z5111 Encounter for antineoplastic chemotherapy: Secondary | ICD-10-CM | POA: Diagnosis not present

## 2020-10-15 NOTE — Telephone Encounter (Signed)
Scheduled appt per 4/11 sch msg. Pt aware.  

## 2020-10-15 NOTE — Progress Notes (Signed)
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 Since he is EBV negative, there is no clear role of induction. We discussed role of concurrent chemoradiation followed by adjuvant chemotherapy. He was very anxious about bolus cisplatin dosing. We will proceed with weekly cisplatin dosing, port scheduled, PEG has to be done by General surgery because of some anatomy issues noted by IR, colon is anterior to stomach. OK to proceed with first week of cisplatin if labs are within parameters the day of treatment. He should be scheduled for chemo teach, weekly visits, labs and chemotherapy. RTC as scheduled.   No orders of the defined types were placed in this encounter.    HISTORY OF PRESENTING ILLNESS:   Alan Scurlock Sr. 65 y.o. male is here because of nasopharyngeal cancer.  Alan Eisemann Sr. is a 65 y.o. male who presented with six-month history of gradually enlarging bilateral lymph nodes with associated mild discomfort and pressure.  Subsequently, the patient saw Dr. Wilburn Cornelia, who recommended CT scan of neck and ultrasound-guided needle core biopsy for soft tissue diagnosis.  Biopsy of right neck lymph node on 08/30/2020 revealed: squamous cell carcinoma, p16 positive. EBV ordered, negative.  He had CT soft tissue neck done on August 07, 2020 which showed nasopharyngeal soft tissue  prominence, greatest soft tissue effacement of adjacent right parapharyngeal fat suspected to be at least enlarged right retropharyngeal lymph node.  Bulky bilateral cervical midline likely right intraparotid lymphadenopathy  PET/CT scan shows intense FDG uptake with area of increased soft tissue fullness in the posterior nasopharynx.  Extensive bulky bilateral FDG avid cervical adenopathy.  Large FDG avid lymph node also identified within the right parotid gland.  Imaging findings compatible with metastatic adenopathy.  Subcentimeter right supraclavicular lymph node exhibits mild FDG uptake of low background activity equivocal for nodal metastasis.  No additional signs of metastatic disease.  He had  right neck lymph node biopsy which is positive for squamous cell carcinoma, p16 positive, EBV negative  During his initial visit, we recommended concurrent CRT.  He is here for follow up. He denies any new complaints since last visit. He is able to eat and swallow well. He was able to maintain weight No pain, difficulty breathing  Rest of the pertinent 10 point ROS reviewed and negative.  REVIEW OF SYSTEMS:   Constitutional: Denies fevers, chills or abnormal night sweats Eyes: Denies blurriness of vision, double vision or watery eyes Ears, nose, mouth, throat, and face: Denies mucositis or sore throat Respiratory: Denies cough, dyspnea or wheezes Cardiovascular: Denies palpitation, chest discomfort or lower extremity swelling Gastrointestinal:  Denies nausea, heartburn or change in bowel habits Skin: Denies abnormal skin rashes Lymphatics: Denies new lymphadenopathy or easy bruising Neurological:Denies numbness, tingling or new weaknesses Behavioral/Psych: Mood is stable, no new changes  All other systems were reviewed with the patient and are negative.  MEDICAL HISTORY:   No past medical history on file.  SURGICAL HISTORY: Past Surgical History:  Procedure Laterality Date  . HERNIA  REPAIR    . ROTATOR CUFF REPAIR    . Torn Labrum      SOCIAL HISTORY: Social History   Socioeconomic History  . Marital status: Married    Spouse name: Not on file  . Number of children: Not on file  . Years of education: Not on file  . Highest education level: Not on file  Occupational History  . Not on file  Tobacco Use  . Smoking status: Never Smoker  . Smokeless tobacco: Never Used  Vaping Use  . Vaping Use: Never used  Substance and Sexual Activity  . Alcohol use: Not Currently    Comment: very rarely  . Drug use: No  . Sexual activity: Not Currently  Other Topics Concern  . Not on file  Social History Narrative  . Not on file   Social Determinants of Health   Financial Resource Strain: Not on file  Food Insecurity: Not on file  Transportation Needs: Not on file  Physical Activity: Not on file  Stress: Not on file  Social Connections: Not on file  Intimate Partner Violence: Not on file    FAMILY HISTORY: No family history on file.  ALLERGIES:  has No Known Allergies.  MEDICATIONS:  Current Outpatient Medications  Medication Sig Dispense Refill  . amLODipine (NORVASC) 10 MG tablet Take 10 mg by mouth daily.    Marland Kitchen atorvastatin (LIPITOR) 80 MG tablet Take 80 mg by mouth daily.    . cetirizine (ZYRTEC) 10 MG tablet Take 10 mg by mouth daily.    Marland Kitchen dexamethasone (DECADRON) 4 MG tablet Take 2 tablets (8 mg total) by mouth daily. Take daily x 3 days starting the day after cisplatin chemotherapy. Take with food. 30 tablet 1  . ergocalciferol (VITAMIN D2) 1.25 MG (50000 UT) capsule Take 50,000 Units by mouth once a week. Wednesday    . lidocaine-prilocaine (EMLA) cream Apply to affected area once 30 g 3  . LORazepam (ATIVAN) 0.5 MG tablet Take 1 tablet (0.5 mg total) by mouth every 6 (six) hours as needed (Nausea or vomiting). 30 tablet 0  . nitroGLYCERIN (NITROSTAT) 0.4 MG SL tablet Place 0.4 mg under the tongue every 5 (five) minutes x 3 doses as needed for chest  pain.    Marland Kitchen omeprazole (PRILOSEC) 20 MG capsule Take 20 mg by mouth daily.    . ondansetron (ZOFRAN) 8 MG tablet Take 1 tablet (8 mg total) by mouth 2 (two) times daily as needed. Start on the third day after cisplatin chemotherapy. 30 tablet 1  . prochlorperazine (COMPAZINE) 10 MG tablet Take 1 tablet (10 mg total) by mouth every 6 (six) hours as needed (Nausea or vomiting). 30 tablet 1  . sildenafil (VIAGRA) 50 MG tablet Take 50-100 mg by mouth daily as needed for erectile dysfunction.     No current facility-administered medications for this visit.     PHYSICAL EXAMINATION:  ECOG PERFORMANCE STATUS: 0 - Asymptomatic  Vitals:   10/15/20 0924  BP: 133/75  Pulse: 86  Resp: 17  Temp: (!) 97.5 F (36.4 C)  SpO2: 98%   Filed Weights   10/15/20 0924  Weight: 186 lb 9.6 oz (84.6 kg)    GENERAL:alert, no distress and comfortable SKIN: skin color, texture, turgor are normal, no rashes or significant lesions EYES: normal, conjunctiva are pink and non-injected, sclera clear OROPHARYNX:no exudate, no erythema and lips, buccal mucosa, and tongue normal  NECK: Large bulky bilateral lymphadenopathy noted in the neck region.  I could not  palpate supraclavicular lymph nodes LYMPH:  no palpable lymphadenopathy axillary or inguinal LUNGS: clear to auscultation and percussion with normal breathing effort HEART: regular rate & rhythm and no murmurs and no lower extremity edema ABDOMEN:abdomen soft, non-tender and normal bowel sounds Musculoskeletal:no cyanosis of digits and no clubbing  PSYCH: alert & oriented x 3 with fluent speech NEURO: no focal motor/sensory deficits  LABORATORY DATA:  I have reviewed the data as listed Lab Results  Component Value Date   WBC 5.4 10/08/2020   HGB 14.2 10/08/2020   HCT 44.5 10/08/2020   MCV 90.3 10/08/2020   PLT 257 10/08/2020     Chemistry      Component Value Date/Time   NA 143 10/08/2020 1250   K 4.1 10/08/2020 1250   CL 106 10/08/2020 1250    CO2 26 10/08/2020 1250   BUN 24 (H) 10/08/2020 1250   CREATININE 1.29 (H) 10/08/2020 1250      Component Value Date/Time   CALCIUM 9.2 10/08/2020 1250   ALKPHOS 96 10/08/2020 1250   AST 16 10/08/2020 1250   ALT 16 10/08/2020 1250   BILITOT 0.5 10/08/2020 1250       RADIOGRAPHIC STUDIES: I have personally reviewed the radiological images as listed and agreed with the findings in the report. NM PET Image Initial (PI) Skull Base To Thigh  Result Date: 09/28/2020 CLINICAL DATA:  Initial treatment strategy for squamous cell carcinoma of the head and neck. EXAM: NUCLEAR MEDICINE PET SKULL BASE TO THIGH TECHNIQUE: 10.77 mCi F-18 FDG was injected intravenously. Full-ring PET imaging was performed from the skull base to thigh after the radiotracer. CT data was obtained and used for attenuation correction and anatomic localization. Fasting blood glucose: 113 mg/dl COMPARISON:  CT neck 08/07/2020 FINDINGS: Mediastinal blood pool activity: SUV max 3.08 Liver activity: SUV max NA NECK: Intense FDG uptake corresponding to increased posterior nasal pharyngeal soft tissue has an SUV max of 17.29, image 15/4. Bulky bilateral FDG avid cervical lymph nodes are identified, including: Right retropharyngeal lymph node measures 1.7 cm and has an SUV max of 14.78, image 17/4. Right parotid lymph node measures 2.6 cm within SUV max of 9.69. Right level 2 node measures 3.1 cm and has an SUV max of 15.8, image 25/4. Right level 3/4 node measures 3.7 cm and has an SUV max of 15.34, image 42/4. Left level 2 node measures 3.2 cm and has an SUV max of 18.2. Incidental CT findings: none CHEST: Right supraclavicular node measures 0.7 cm within SUV max of 3.7, image 50/4. No FDG avid axillary, mediastinal or hilar lymph nodes. Incidental CT findings: No suspicious or FDG avid pulmonary nodules. Tiny nodule in the lateral right apex measures 3 mm and is too small to characterize by PET-CT, image 10/8. Aortic atherosclerosis.  Coronary artery calcifications. ABDOMEN/PELVIS: No abnormal FDG uptake within the liver, pancreas, or spleen. No abnormal uptake within the adrenal glands. No hypermetabolic abdominopelvic lymph nodes. Incidental CT findings: Aortic atherosclerosis. Bilateral renal calculi. No hydronephrosis. SKELETON: No focal hypermetabolic activity to suggest skeletal metastasis. Incidental CT findings: none IMPRESSION: 1. There is intense FDG uptake within the area of increased soft tissue fullness in the posterior nasopharynx. Cannot exclude primary nasopharyngeal neoplasm. 2. Extensive, bulky bilateral FDG avid cervical adenopathy. Large FDG avid lymph node is also identified within the right parotid gland. Imaging findings compatible with metastatic adenopathy. 3. Subcentimeter right supraclavicular lymph node exhibits mild FDG uptake above background activity. Equivocal for nodal metastasis. No additional signs of thoracic,  abdominal, or pelvic metastasis. No evidence for osseous metastatic disease. Electronically Signed   By: Kerby Moors M.D.   On: 09/28/2020 18:24   VAS [place] LOWER EXTREMITY VENOUS (DVT)  Result Date: 10/04/2020  Lower Venous DVT Study Indications: History of swelling LT knee/calf.  Risk Factors: Cancer nasopharyngeal soft palate. Comparison Study: No prior studies. Performing Technologist: Darlin Coco RDMS,RVT  Examination Guidelines: A complete evaluation includes B-mode imaging, spectral Doppler, color Doppler, and power Doppler as needed of all accessible portions of each vessel. Bilateral testing is considered an integral part of a complete examination. Limited examinations for reoccurring indications may be performed as noted. The reflux portion of the exam is performed with the patient in reverse Trendelenburg.  +-----+---------------+---------+-----------+----------+--------------+ RIGHTCompressibilityPhasicitySpontaneityPropertiesThrombus Aging  +-----+---------------+---------+-----------+----------+--------------+ CFV  Full           Yes      Yes                                 +-----+---------------+---------+-----------+----------+--------------+   +---------+---------------+---------+-----------+----------+--------------+ LEFT     CompressibilityPhasicitySpontaneityPropertiesThrombus Aging +---------+---------------+---------+-----------+----------+--------------+ CFV      Full           Yes      Yes                                 +---------+---------------+---------+-----------+----------+--------------+ SFJ      Full                                                        +---------+---------------+---------+-----------+----------+--------------+ FV Prox  Full                                                        +---------+---------------+---------+-----------+----------+--------------+ FV Mid   Full                                                        +---------+---------------+---------+-----------+----------+--------------+ FV DistalFull                                                        +---------+---------------+---------+-----------+----------+--------------+ PFV      Full                                                        +---------+---------------+---------+-----------+----------+--------------+ POP      Full           Yes      Yes                                 +---------+---------------+---------+-----------+----------+--------------+  PTV      Full                                                        +---------+---------------+---------+-----------+----------+--------------+ PERO     Full                                                        +---------+---------------+---------+-----------+----------+--------------+     Summary: RIGHT: - No evidence of common femoral vein obstruction.  LEFT: - There is no evidence of deep vein thrombosis in the  lower extremity.  - No cystic structure found in the popliteal fossa.  *See table(s) above for measurements and observations. Electronically signed by Monica Martinez MD on 10/04/2020 at 4:54:22 PM.    Final     All questions were answered. The patient knows to call the clinic with any problems, questions or concerns. I spent 30 minutes in the care of this patient including H and P, review of records, counseling and coordination of care.     Benay Pike, MD 10/15/2020 10:14 AM

## 2020-10-15 NOTE — Assessment & Plan Note (Addendum)
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 Since he is EBV negative, there is no clear role of induction. We discussed role of concurrent chemoradiation followed by adjuvant chemotherapy. He was very anxious about bolus cisplatin dosing. We will proceed with weekly cisplatin dosing, port scheduled, PEG has to be done by General surgery because of some anatomy issues noted by IR, colon is anterior to stomach. OK to proceed with first week of cisplatin if labs are within parameters the day of treatment. He should be scheduled for chemo teach, weekly visits, labs and chemotherapy. RTC as scheduled.

## 2020-10-15 NOTE — Progress Notes (Signed)
[name]  Initial Assessment   Alan Henry. is a 65 y.o. year old male contacted by phone. Clinical Social Work was referred by radiation oncology for assessment of psychosocial needs.   SDOH (Social Determinants of Health) assessments performed: Yes SDOH Interventions   Flowsheet Row Most Recent Value  SDOH Interventions   Food Insecurity Interventions Intervention Not Indicated  Housing Interventions Intervention Not Indicated  Stress Interventions Intervention Not Indicated  Social Connections Interventions Intervention Not Indicated  Transportation Interventions [facility]      Distress Screen completed: Yes ONCBCN DISTRESS SCREENING 10/08/2020  Screening Type Initial Screening  Distress experienced in past week (1-10) 0  Emotional problem type Adjusting to illness;Nervousness/Anxiety  Information Concerns Type Lack of info about treatment  Physical Problem type Pain  Physician notified of physical symptoms Yes  Referral to clinical psychology -  Referral to clinical social work -  Referral to dietition -  Referral to financial advocate -  Referral to support programs -  Referral to palliative care -      Family/Social Information:  . Housing Arrangement: patient lives with wife . Family members/support persons in your life? Family, Friends/Colleagues and Church . Patient has 6 adult children living in the [place] and 9 grandchildren.   . Transportation concerns: no, patient has church family and can also drive himself.  Patient is interested in the [facility] transportation program and may utilize during treatment.  Patients wife is no longer able to drive after a stroke a few years ago.  Patient identified his wife's sister and brother in-law as additional support for patient and his wife.  . Employment: Retired. Income source: Public affairs consultant and [place] . Financial concerns: No o Type of concern: None . Food access  concerns: no . Religious or spiritual practice: yes . Medication Concerns: no  . Services Currently in place:  Supportive family and church family  Coping/ Adjustment to diagnosis: . Patient understands treatment plan and what happens next? yes . Concerns about diagnosis and/or treatment: How I will care for other members of my family and maintaining independence . Patient reported stressors: Adjusting to my illness and Physical issues . Hopes and priorities: take treatment one step at a time.  See my children and grandchildren once school is out for summer. . Patient enjoys time with family/ friends . Current coping skills/ strengths: Ability for insight, Capable of independent living, Communication skills, Motivation for treatment/growth, Religious Affiliation and Supportive family/friends    SUMMARY: Current SDOH Barriers:  . Transportation  Clinical Social Work Clinical Goal(s):  . patient will work with SW to address concerns related to transportation needs during treatment.   Interventions: . Discussed common feeling and emotions when being diagnosed with cancer, and the importance of support during treatment . Informed patient of the support team roles and support services at [facility] . Provided CSW contact information and encouraged patient to call with any questions or concerns . Provided education regarding [place] transportation program and support services at [facility]   Follow Up Plan: Patient will contact CSW with any support or resource needs and CSW will see patient on October 17, 2020 after chemo education and provide support program and services information. Patient verbalizes understanding of plan: Yes   Alan Henry, MSW, LCSW, OSW-C Clinical Social Worker [facility] [phone]       Alan Henry , LCSW

## 2020-10-16 ENCOUNTER — Other Ambulatory Visit: Payer: Self-pay | Admitting: Radiology

## 2020-10-16 ENCOUNTER — Ambulatory Visit: Payer: Self-pay | Admitting: Surgery

## 2020-10-16 NOTE — H&P (Addendum)
History of Present Illness (Alan Narvaiz L. [name] MD; 10/16/2020 2:39 PM) Patient words: HPI: 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 He began having swelling in his neck in the last few months, and imaging showed cervical lymphadenopathy with a nasopharyngeal mass. Biopsy of the cervical nodes confirmed adenocarcinoma. He has been seen by Dr. Chryl Heck and will be starting chemoradiation next week. He reports occasional difficulty swallowing but has been able to eat. He is retired but still works part time and is trying to stay as active as possible. IR was consulted for percutaneous G tube placement but recommended surgical placement because there was colon between the stomach and abdominal wall on an abdominal CT in 2017.  PMH: HTN, HLD  PSH: umbilical hernia repair (during childhood), rotator cuff repair  FHx: prostate cancer (brother), breast cancer (mother)  Social: Denies use of tobacco/EtOH/drugs.    Past Surgical History [name], CMA; 10/16/2020 2:10 PM) Shoulder Surgery  Left.  Diagnostic Studies History [name], [place]; 10/16/2020 2:10 PM) Colonoscopy  never  Allergies [name], CMA; 10/16/2020 2:12 PM) POISON OAK  POISON IVY EXTRACT  Allergies Reconciled   Medication History [name], CMA; 10/16/2020 2:12 PM) Cetirizine HCl (10MG  Capsule, Oral) Active. Atorvastatin Calcium (80MG  Tablet, Oral) Active. amLODIPine Besylate (10MG  Tablet, Oral) Active. Vitamin D (Ergocalciferol) (1.25 MG(50000 UT) Capsule, Oral) Active. Omeprazole (20MG  Capsule DR, Oral) Active. Sildenafil Citrate (100MG  Tablet, Oral) Active. Medications Reconciled  Social History [name], [place]; 10/16/2020 2:10 PM) Alcohol use  Occasional alcohol use. Caffeine use  Carbonated beverages. No drug use  Tobacco use  Never smoker.  Family History  [name], [place]; 10/16/2020 2:10 PM) Breast Cancer  Mother. Cerebrovascular Accident  Family Members In General. Diabetes Mellitus  Family Members In General, Sister. Hypertension  Brother, Family Members In [place], Father, Mother, Sister. Prostate Cancer  Brother. Seizure disorder  Family Members In General. Thyroid problems  Sister.  Other Problems [name], CMA; 10/16/2020 2:10 PM) High blood pressure  Hypercholesterolemia  Kidney Alan Henry     Review of Systems [name] CMA; 10/16/2020 2:10 PM) General Present- Weight Loss. Not Present- Appetite Loss, Chills, Fatigue, Fever, Night Sweats and Weight Gain. Skin Not Present- Change in Wart/Mole, Dryness, Hives, Jaundice, New Lesions, Non-Healing Wounds, Rash and Ulcer. HEENT Not Present- Earache, Hearing Loss, Hoarseness, Nose Bleed, Oral Ulcers, Ringing in the Ears, Seasonal Allergies, Sinus Pain, Sore Throat, Visual Disturbances, Wears glasses/contact lenses and Yellow Eyes. Respiratory Not Present- Bloody sputum, Chronic Cough, Difficulty Breathing, Snoring and Wheezing. Breast Not Present- Breast Mass, Breast Pain, Nipple Discharge and Skin Changes. Cardiovascular Not Present- Chest Pain, Difficulty Breathing Lying Down, Leg Cramps, Palpitations, Rapid Heart Rate, Shortness of Breath and Swelling of Extremities. Gastrointestinal Not Present- Abdominal Pain, Bloating, Bloody Stool, Change in Bowel Habits, Chronic diarrhea, Constipation, Difficulty Swallowing, Excessive gas, Gets full quickly at meals, Hemorrhoids, Indigestion, Nausea, Rectal Pain and Vomiting. Male Genitourinary Not Present- Blood in Urine, Change in Urinary Stream, Frequency, Impotence, Nocturia, Painful Urination, Urgency and Urine Leakage. Musculoskeletal Not Present- Back Pain, Joint Pain, Joint Stiffness, Muscle Pain, Muscle Weakness and Swelling of Extremities. Neurological Not Present- Decreased Memory, Fainting, Headaches, Numbness,  Seizures, Tingling, Tremor, Trouble walking and Weakness. Psychiatric Not Present- Anxiety, Bipolar, Change in Sleep Pattern, Depression, Fearful and Frequent crying. Endocrine Not Present- Cold Intolerance, Excessive Hunger, Hair Changes, Heat Intolerance, Hot flashes and New Diabetes.  Hematology Not Present- Blood Thinners, Easy Bruising, Excessive bleeding, Gland problems, HIV and Persistent Infections.  Vitals [name] CMA; 10/16/2020 2:11 PM) 10/16/2020 2:10 PM Weight: 186.4 lb Height: 69.5in Body Surface Area: 2.02 m Body Mass Index: 27.13 kg/m  Temp.: 97.64F  Pulse: 96 (Regular)  BP: 118/80(Sitting, Left Arm, Standard)       Physical Exam (Terre Zabriskie L. [name] MD; 10/16/2020 2:38 PM) The physical exam findings are as follows: Note: Constitutional: No acute distress; conversant; no deformities Neuro: alert and oriented; cranial nerves grossly in tact; no focal deficits Eyes: Moist conjunctiva; anicteric sclerae; extraocular movements in tact Neck: Significant bilateral cervical adenopathy Lungs: Normal respiratory effort; lungs clear to auscultation bilaterally; symmetric chest wall expansion CV: Regular rate and rhythm; no murmurs; no pitting edema GI: Abdomen soft, nontender, nondistended; no masses or organomegaly; well-healed infraumbilical surgical scar MSK: Normal gait and station; no clubbing/cyanosis Psychiatric: Appropriate affect; alert and oriented 3 Lymphatic: Bilateral cervical lymphadenopathy    Assessment & Plan (Thornton L. [name] MD; 10/16/2020 2:44 PM) NASOPHARYNGEAL CANCER (C11.9) Story: 65 yo male with nasopharyngeal cancer, referred for G tube placement. I reviewed his prior abdominal CT which is several years old, but the stomach is far from the abdominal wall with interposed colon. I will plan for a laparoscopic-assisted PEG tube. If unable to pass the EGD due to pharyngeal mass, then I will do a purely laparoscopic vs open Stamm gastrostomy  via a mini laparotomy. I discussed all these options with the patient. Will observe him overnight in the hospital. Will schedule for next Monday 4/18 at [facility]. He is to start chemoradiation on Wed 4/20.  Michaelle Birks, [facility], Hepatobiliary and Pancreatic Surgery 10/16/20 2:46 PM

## 2020-10-16 NOTE — Progress Notes (Signed)
Oncology Nurse Navigator Documentation  I just spoke with Dr. Chryl Heck who has spoken with Alan Birks MD from [facility]. Alan Henry saw Dr. [name] in consult today to place a PEG tube in anticipation of treatment for his head and neck cancer which starts 10/23/20.. Dr. [name] prefers to do the surgery before he starts chemo/radiation therapy due to risk of infection and has offered to place the PEG before 10/23/20 as an add- on in the OR. I called Alan Henry to discuss this with him and he adamantly declines to have the PEG placed before Wednesday 4/20. I informed Alan Henry that it is very difficult to reschedule his 7 hour chemotherapy infusion and if he misses the appointment to have his PEG placed he will not be able to be rescheduled and will miss a chemotherapy infusion as was planned for his cancer treatment. He verbalized to me that he is aware but does not want to have the surgery until his wife returns from out of town to be with him for his recovery which is not until next week. I have made Dr. Chryl Heck aware of Alan Henry decision and she will contact Dr. [name]. Alan Henry knows how to contact me with any further questions or concerns he may have.   Harlow Asa RN, BSN, OCN Head & Neck Oncology Nurse [place] at [facility] Phone # [phone]  Fax # [phone]

## 2020-10-16 NOTE — H&P (View-Only) (Signed)
History of Present Illness (Alan Henry L. Alan Resides Henry; 10/16/2020 2:39 PM) Patient words: HPI: 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 He began having swelling in his neck in the last few months, and imaging showed cervical lymphadenopathy with a nasopharyngeal mass. Biopsy of the cervical nodes confirmed adenocarcinoma. He has been seen by Dr. Chryl Henry and will be starting chemoradiation next week. He reports occasional difficulty swallowing but has been able to eat. He is retired but still works part time and is trying to stay as active as possible. IR was consulted for percutaneous G tube placement but recommended surgical placement because there was colon between the stomach and abdominal wall on an abdominal CT in 2017.  PMH: HTN, HLD  PSH: umbilical hernia repair (during childhood), rotator cuff repair  FHx: prostate cancer (brother), breast cancer (mother)  Social: Denies use of tobacco/EtOH/drugs.    Past Surgical History Alan Henry, CMA; 10/16/2020 2:10 PM) Shoulder Surgery  Left.  Diagnostic Studies History Alan Henry, [place]; 10/16/2020 2:10 PM) Colonoscopy  never  Allergies Alan Henry, CMA; 10/16/2020 2:12 PM) POISON OAK  POISON IVY EXTRACT  Allergies Reconciled   Medication History Alan Henry, CMA; 10/16/2020 2:12 PM) Cetirizine HCl (10MG  Capsule, Oral) Active. Atorvastatin Calcium (80MG  Tablet, Oral) Active. amLODIPine Besylate (10MG  Tablet, Oral) Active. Vitamin D (Ergocalciferol) (1.25 MG(50000 UT) Capsule, Oral) Active. Omeprazole (20MG  Capsule DR, Oral) Active. Sildenafil Citrate (100MG  Tablet, Oral) Active. Medications Reconciled  Social History Alan Henry, [place]; 10/16/2020 2:10 PM) Alcohol use  Occasional alcohol use. Caffeine use  Carbonated beverages. No drug use  Tobacco use  Never smoker.  Family History  Alan Henry, [place]; 10/16/2020 2:10 PM) Breast Cancer  Mother. Cerebrovascular Accident  Family Members In General. Diabetes Mellitus  Family Members In General, Sister. Hypertension  Brother, Family Members In [place], Father, Mother, Sister. Prostate Cancer  Brother. Seizure disorder  Family Members In General. Thyroid problems  Sister.  Other Problems Alan Henry, CMA; 10/16/2020 2:10 PM) High blood pressure  Hypercholesterolemia  Kidney Alan Henry     Review of Systems Alan Henry CMA; 10/16/2020 2:10 PM) General Present- Weight Loss. Not Present- Appetite Loss, Chills, Fatigue, Fever, Night Sweats and Weight Gain. Skin Not Present- Change in Wart/Mole, Dryness, Hives, Jaundice, New Lesions, Non-Healing Wounds, Rash and Ulcer. HEENT Not Present- Earache, Hearing Loss, Hoarseness, Nose Bleed, Oral Ulcers, Ringing in the Ears, Seasonal Allergies, Sinus Pain, Sore Throat, Visual Disturbances, Wears glasses/contact lenses and Yellow Eyes. Respiratory Not Present- Bloody sputum, Chronic Cough, Difficulty Breathing, Snoring and Wheezing. Breast Not Present- Breast Mass, Breast Pain, Nipple Discharge and Skin Changes. Cardiovascular Not Present- Chest Pain, Difficulty Breathing Lying Down, Leg Cramps, Palpitations, Rapid Heart Rate, Shortness of Breath and Swelling of Extremities. Gastrointestinal Not Present- Abdominal Pain, Bloating, Bloody Stool, Change in Bowel Habits, Chronic diarrhea, Constipation, Difficulty Swallowing, Excessive gas, Gets full quickly at meals, Hemorrhoids, Indigestion, Nausea, Rectal Pain and Vomiting. Male Genitourinary Not Present- Blood in Urine, Change in Urinary Stream, Frequency, Impotence, Nocturia, Painful Urination, Urgency and Urine Leakage. Musculoskeletal Not Present- Back Pain, Joint Pain, Joint Stiffness, Muscle Pain, Muscle Weakness and Swelling of Extremities. Neurological Not Present- Decreased Memory, Fainting, Headaches, Numbness,  Seizures, Tingling, Tremor, Trouble walking and Weakness. Psychiatric Not Present- Anxiety, Bipolar, Change in Sleep Pattern, Depression, Fearful and Frequent crying. Endocrine Not Present- Cold Intolerance, Excessive Hunger, Hair Changes, Heat Intolerance, Hot flashes and New Diabetes.  Hematology Not Present- Blood Thinners, Easy Bruising, Excessive bleeding, Gland problems, HIV and Persistent Infections.  Vitals Alan Henry CMA; 10/16/2020 2:11 PM) 10/16/2020 2:10 PM Weight: 186.4 lb Height: 69.5in Body Surface Area: 2.02 m Body Mass Index: 27.13 kg/m  Temp.: 97.21F  Pulse: 96 (Regular)  BP: 118/80(Sitting, Left Arm, Standard)       Physical Exam (Alan Henry L. Alan Resides Henry; 10/16/2020 2:38 PM) The physical exam findings are as follows: Note: Constitutional: No acute distress; conversant; no deformities Neuro: alert and oriented; cranial nerves grossly in tact; no focal deficits Eyes: Moist conjunctiva; anicteric sclerae; extraocular movements in tact Neck: Significant bilateral cervical adenopathy Lungs: Normal respiratory effort; lungs clear to auscultation bilaterally; symmetric chest wall expansion CV: Regular rate and rhythm; no murmurs; no pitting edema GI: Abdomen soft, nontender, nondistended; no masses or organomegaly; well-healed infraumbilical surgical scar MSK: Normal gait and station; no clubbing/cyanosis Psychiatric: Appropriate affect; alert and oriented 3 Lymphatic: Bilateral cervical lymphadenopathy    Assessment & Plan (Alan Henry; 10/16/2020 2:44 PM) NASOPHARYNGEAL CANCER (C11.9) Story: 65 yo male with nasopharyngeal cancer, referred for G tube placement. I reviewed his prior abdominal CT which is several years old, but the stomach is far from the abdominal wall with interposed colon. I will plan for a laparoscopic-assisted PEG tube. If unable to pass the EGD due to pharyngeal mass, then I will do a purely laparoscopic vs open Stamm gastrostomy  via a mini laparotomy. I discussed all these options with the patient. Will observe him overnight in the hospital. Will schedule for next Monday 4/18 at [facility]. He is to start chemoradiation on Wed 4/20.  Michaelle Birks, [facility], Hepatobiliary and Pancreatic Surgery 10/16/20 2:46 PM

## 2020-10-17 ENCOUNTER — Encounter: Payer: Self-pay | Admitting: Hematology and Oncology

## 2020-10-17 ENCOUNTER — Inpatient Hospital Stay: Payer: 59

## 2020-10-17 ENCOUNTER — Inpatient Hospital Stay: Payer: 59 | Admitting: Nutrition

## 2020-10-17 ENCOUNTER — Other Ambulatory Visit: Payer: Self-pay

## 2020-10-17 ENCOUNTER — Encounter: Payer: Self-pay | Admitting: General Practice

## 2020-10-17 NOTE — Progress Notes (Signed)
[place] CSW Progress Notes  Patient expresses concern about transportation - has church volunteers who can assist.  Wife disabled from stroke.  He plans to drive as much as he is able but may need help going forward. He understands that he will need to contact Transportation directly to schedule rides as needed.  Referral sent to [employer].  They will contact patient to enroll.  Edwyna Shell, LCSW Clinical Social Worker Phone:  [phone]

## 2020-10-17 NOTE — Progress Notes (Signed)
Met with patient at registration to introduce myself as Arboriculturist and to offer available resources.  Discussed one-time $1000 grant and qualifications to assist with personal expenses while going through treatment. Advised what is needed to apply.  Gave him my card if interested in applying and for any additional financial questions or concerns.

## 2020-10-17 NOTE — Progress Notes (Signed)
65 year old male diagnosed with nasopharyngeal cancer of the soft palate.  He is followed by Dr. Chryl Heck and Dr. Isidore Moos.  He will be receiving weekly cisplatin with radiation therapy.  Past medical history not significant.  Medications include Ativan, Prilosec, Zofran, Compazine, and Decadron.  Labs include glucose 110, BUN 24, creatinine 1.29.  Height: 69.5 inches. Weight: 186.6 pounds April 12. Usual body weight: 190 pounds. BMI: 27.16.  Patient is scheduled for surgical feeding tube placement on April 18.  He reports he is eating well currently and denies nutrition impact symptoms.  He tries to exercise daily.  He is a little overwhelmed with all of the new information regarding his cancer but he is confident he can deal with everything.  He is asking for a schedule clarification today.  Nutrition diagnosis: Food and nutrition related knowledge deficit related to new nasopharyngeal cancer as evidenced by no prior need for nutrition related information.  Intervention: Educated on importance of small frequent meals with adequate calories and protein for weight maintenance. Discussed transitioning to soft foods when swallowing difficulty becomes evident. Recommended oral nutrition supplements such as Ensure plus or Ensure Enlive.  Provided 1 complementary case. Educated on strategies for dry mouth and thick saliva as well as nausea and vomiting and constipation.  Encouraged increased fluids. Brief education provided on feeding tube administration. Questions were answered.  Teach back method was used.  Contact information given.  Nutrition fact sheets provided.  Monitoring, evaluation, goals: Patient will tolerate adequate calories and protein to minimize weight loss.  Next visit:Thursday, April 28 during infusion.  **Disclaimer: This note was dictated with voice recognition software. Similar sounding words can inadvertently be transcribed and this note may contain transcription errors  which may not have been corrected upon publication of note.**

## 2020-10-18 ENCOUNTER — Ambulatory Visit (HOSPITAL_COMMUNITY): Payer: 59

## 2020-10-18 ENCOUNTER — Ambulatory Visit (HOSPITAL_COMMUNITY)
Admission: RE | Admit: 2020-10-18 | Discharge: 2020-10-18 | Disposition: A | Discharge status: Home / Self Care | Payer: 59 | Source: Ambulatory Visit | Attending: Hematology and Oncology | Admitting: Hematology and Oncology

## 2020-10-18 ENCOUNTER — Other Ambulatory Visit (HOSPITAL_COMMUNITY): Payer: 59

## 2020-10-18 DIAGNOSIS — C760 Malignant neoplasm of head, face and neck: Secondary | ICD-10-CM | POA: Diagnosis not present

## 2020-10-18 DIAGNOSIS — C113 Malignant neoplasm of anterior wall of nasopharynx: Secondary | ICD-10-CM

## 2020-10-18 DIAGNOSIS — Z79899 Other long term (current) drug therapy: Secondary | ICD-10-CM | POA: Diagnosis not present

## 2020-10-18 DIAGNOSIS — E785 Hyperlipidemia, unspecified: Secondary | ICD-10-CM | POA: Diagnosis not present

## 2020-10-18 DIAGNOSIS — I10 Essential (primary) hypertension: Secondary | ICD-10-CM | POA: Diagnosis not present

## 2020-10-18 HISTORY — PX: IR IMAGING GUIDED PORT INSERTION: IMG5740

## 2020-10-18 MED ORDER — MIDAZOLAM HCL 2 MG/2ML IJ SOLN
INTRAMUSCULAR | Status: AC | PRN
  Administered 2020-10-18: 1 mg via INTRAVENOUS

## 2020-10-18 MED ORDER — FENTANYL CITRATE (PF) 100 MCG/2ML IJ SOLN
INTRAMUSCULAR | Status: AC
  Filled 2020-10-18: qty 2

## 2020-10-18 MED ORDER — FENTANYL CITRATE (PF) 100 MCG/2ML IJ SOLN
INTRAMUSCULAR | Status: AC | PRN
  Administered 2020-10-18: 50 ug via INTRAVENOUS

## 2020-10-18 MED ORDER — LIDOCAINE-EPINEPHRINE 1 %-1:100000 IJ SOLN
INTRAMUSCULAR | Status: AC
  Filled 2020-10-18: qty 1

## 2020-10-18 MED ORDER — HEPARIN SOD (PORK) LOCK FLUSH 100 UNIT/ML IV SOLN
INTRAVENOUS | Status: AC
  Filled 2020-10-18: qty 5

## 2020-10-18 MED ORDER — LIDOCAINE-EPINEPHRINE (PF) 1 %-1:200000 IJ SOLN: INTRAMUSCULAR | Status: AC | PRN

## 2020-10-18 MED ORDER — SODIUM CHLORIDE 0.9 % IV SOLN: INTRAVENOUS | Status: DC

## 2020-10-18 MED ORDER — HEPARIN SOD (PORK) LOCK FLUSH 100 UNIT/ML IV SOLN
INTRAVENOUS | Status: AC | PRN
  Administered 2020-10-18: 500 [IU] via INTRAVENOUS

## 2020-10-18 MED ORDER — LIDOCAINE-EPINEPHRINE 1 %-1:100000 IJ SOLN
INTRAMUSCULAR | Status: AC | PRN
  Administered 2020-10-18: 20 mL

## 2020-10-18 MED ORDER — MIDAZOLAM HCL 2 MG/2ML IJ SOLN
INTRAMUSCULAR | Status: AC
  Filled 2020-10-18: qty 4

## 2020-10-18 NOTE — Discharge Instructions (Signed)
Implanted Port Insertion, Care After This sheet gives you information about how to care for yourself after your procedure. Your health care provider may also give you more specific instructions. If you have problems or questions, contact your health care provider. What can I expect after the procedure? After the procedure, it is common to have:  Discomfort at the port insertion site.  Bruising on the skin over the port. This should improve over 3-4 days. Follow these instructions at home: Port care  After your port is placed, you will get a manufacturer's information card. The card has information about your port. Keep this card with you at all times.  Take care of the port as told by your health care provider. Ask your health care provider if you or a family member can get training for taking care of the port at home. A home health care nurse may also take care of the port.  Make sure to remember what type of port you have. Incision care  Follow instructions from your health care provider about how to take care of your port insertion site. Make sure you: ? Wash your hands with soap and water before and after you change your bandage (dressing). If soap and water are not available, use hand sanitizer. ? Change your dressing as told by your health care provider. ? Leave stitches (sutures), skin glue, or adhesive strips in place. These skin closures may need to stay in place for 2 weeks or longer. If adhesive strip edges start to loosen and curl up, you may trim the loose edges. Do not remove adhesive strips completely unless your health care provider tells you to do that.  Check your port insertion site every day for signs of infection. Check for: ? Redness, swelling, or pain. ? Fluid or blood. ? Warmth. ? Pus or a bad smell.      Activity  Return to your normal activities as told by your health care provider. Ask your health care provider what activities are safe for you.  Do not  lift anything that is heavier than 10 lb (4.5 kg), or the limit that you are told, until your health care provider says that it is safe. General instructions  Take over-the-counter and prescription medicines only as told by your health care provider.  Do not take baths, swim, or use a hot tub until your health care provider approves. Ask your health care provider if you may take showers. You may only be allowed to take sponge baths.  Do not drive for 24 hours if you were given a sedative during your procedure.  Wear a medical alert bracelet in case of an emergency. This will tell any health care providers that you have a port.  Keep all follow-up visits as told by your health care provider. This is important. Contact a health care provider if:  You cannot flush your port with saline as directed, or you cannot draw blood from the port.  You have a fever or chills.  You have redness, swelling, or pain around your port insertion site.  You have fluid or blood coming from your port insertion site.  Your port insertion site feels warm to the touch.  You have pus or a bad smell coming from the port insertion site. Get help right away if:  You have chest pain or shortness of breath.  You have bleeding from your port that you cannot control. Summary  Take care of the port as told by your   health care provider. Keep the manufacturer's information card with you at all times.  Change your dressing as told by your health care provider.  Contact a health care provider if you have a fever or chills or if you have redness, swelling, or pain around your port insertion site.  Keep all follow-up visits as told by your health care provider. This information is not intended to replace advice given to you by your health care provider. Make sure you discuss any questions you have with your health care provider. Document Revised: 01/18/2018 Document Reviewed: 01/18/2018 Elsevier Patient Education   2021 [employer]  

## 2020-10-18 NOTE — H&P (Signed)
Chief Complaint: Chemotherapy access. Request is for portacath placement  Referring Physician(s): Iruku,Praveena  Supervising Physician: Jacqulynn Cadet  Patient Status: [facility] - Out-pt  History of Present Illness: Alan Vallin. is a 65 y.o. male outpatient. History of HTN, HLD,  chest pain of unknown etiology. 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 Pet scan from 3.26.22 shows multiple lymph nodes in the neck as  hypermetabolic and PET avid right supraclavicular lymph node and no lines or drains.    Currently without any significant complaints. Patient alert and laying in bed, calm and comfortable. Denies any fevers, headache, chest pain, SOB, cough, abdominal pain, nausea, vomiting or bleeding. Return precautions and treatment recommendations and follow-up discussed with the patient who is agreeable with the plan.   No past medical history on file.  Past Surgical History:  Procedure Laterality Date  . HERNIA REPAIR    . ROTATOR CUFF REPAIR    . Torn Labrum      Allergies: Patient has no known allergies.  Medications: Prior to Admission medications   Medication Sig Start Date End Date Taking? Authorizing Provider  amLODipine (NORVASC) 10 MG tablet Take 10 mg by mouth daily. 09/08/19  Yes [provider]  atorvastatin (LIPITOR) 80 MG tablet Take 80 mg by mouth daily.   Yes [provider]  cetirizine (ZYRTEC) 10 MG tablet Take 10 mg by mouth daily.   Yes [provider]  ergocalciferol (VITAMIN D2) 1.25 MG (50000 UT) capsule Take 50,000 Units by mouth once a week. Wednesday   Yes [provider]  omeprazole (PRILOSEC) 20 MG capsule Take 20 mg by mouth daily. 06/23/19  Yes [provider]  sildenafil (VIAGRA) 50 MG tablet Take 50-100 mg by mouth daily as needed for erectile dysfunction. 08/01/20  Yes [provider]  dexamethasone (DECADRON) 4 MG tablet Take 2 tablets (8 mg total)  by mouth daily. Take daily x 3 days starting the day after cisplatin chemotherapy. Take with food. 10/08/20   Benay Pike, MD  lidocaine-prilocaine (EMLA) cream Apply to affected area once 10/08/20   Benay Pike, MD  LORazepam (ATIVAN) 0.5 MG tablet Take 1 tablet (0.5 mg total) by mouth every 6 (six) hours as needed (Nausea or vomiting). 10/08/20   Benay Pike, MD  nitroGLYCERIN (NITROSTAT) 0.4 MG SL tablet Place 0.4 mg under the tongue every 5 (five) minutes x 3 doses as needed for chest pain. 10/22/19   [provider]  ondansetron (ZOFRAN) 8 MG tablet Take 1 tablet (8 mg total) by mouth 2 (two) times daily as needed. Start on the third day after cisplatin chemotherapy. 10/08/20   Benay Pike, MD  prochlorperazine (COMPAZINE) 10 MG tablet Take 1 tablet (10 mg total) by mouth every 6 (six) hours as needed (Nausea or vomiting). 10/08/20   Benay Pike, MD     No family history on file.  Social History   Socioeconomic History  . Marital status: Married    Spouse name: Not on file  . Number of children: Not on file  . Years of education: Not on file  . Highest education level: Not on file  Occupational History  . Not on file  Tobacco Use  . Smoking status: Never Smoker  . Smokeless tobacco: Never Used  Vaping Use  . Vaping Use: Never used  Substance and Sexual Activity  . Alcohol use: Not Currently    Comment: very rarely  . Drug use: No  .  Sexual activity: Not Currently  Other Topics Concern  . Not on file  Social History Narrative  . Not on file   Social Determinants of Health   Financial Resource Strain: Not on file  Food Insecurity: No Food Insecurity  . Worried About Charity fundraiser in the Last Year: Never true  . Ran Out of Food in the Last Year: Never true  Transportation Needs: No Transportation Needs  . Lack of Transportation (Medical): No  . Lack of Transportation (Non-Medical): No  Physical Activity: Not on file  Stress: No Stress Concern  Present  . Feeling of Stress : Not at all  Social Connections: Socially Integrated  . Frequency of Communication with Friends and Family: More than three times a week  . Frequency of Social Gatherings with Friends and Family: Twice a week  . Attends Religious Services: More than 4 times per year  . Active Member of Clubs or Organizations: Yes  . Attends Archivist Meetings: 1 to 4 times per year  . Marital Status: Married     Review of Systems: A 12 point ROS discussed and pertinent positives are indicated in the HPI above.  All other systems are negative.  Review of Systems  Constitutional: Negative for fever.  HENT: Negative for congestion.   Respiratory: Negative for cough and shortness of breath.   Cardiovascular: Negative for chest pain.  Gastrointestinal: Negative for abdominal pain.  Neurological: Negative for headaches.  Psychiatric/Behavioral: Negative for behavioral problems and confusion.    Vital Signs: BP (!) 154/90   Pulse 77   Temp 98.1 F (36.7 C) (Oral)   Ht 5' 9.5" (1.765 m)   Wt 186 lb 9.6 oz (84.6 kg)   SpO2 99%   BMI 27.16 kg/m   Physical Exam Vitals and nursing note reviewed.  Constitutional:      Appearance: He is well-developed.  HENT:     Head: Normocephalic.     Comments: Swelling noted to bilateral neck Cardiovascular:     Rate and Rhythm: Normal rate and regular rhythm.     Heart sounds: Normal heart sounds.  Pulmonary:     Effort: Pulmonary effort is normal.     Breath sounds: Normal breath sounds.  Musculoskeletal:        General: Normal range of motion.     Cervical back: Normal range of motion.  Skin:    General: Skin is dry.  Neurological:     Mental Status: He is alert and oriented to person, place, and time.     Imaging: NM PET Image Initial (PI) Skull Base To Thigh  Result Date: 09/28/2020 CLINICAL DATA:  Initial treatment strategy for squamous cell carcinoma of the head and neck. EXAM: NUCLEAR MEDICINE PET  SKULL BASE TO THIGH TECHNIQUE: 10.77 mCi F-18 FDG was injected intravenously. Full-ring PET imaging was performed from the skull base to thigh after the radiotracer. CT data was obtained and used for attenuation correction and anatomic localization. Fasting blood glucose: 113 mg/dl COMPARISON:  CT neck 08/07/2020 FINDINGS: Mediastinal blood pool activity: SUV max 3.08 Liver activity: SUV max NA NECK: Intense FDG uptake corresponding to increased posterior nasal pharyngeal soft tissue has an SUV max of 17.29, image 15/4. Bulky bilateral FDG avid cervical lymph nodes are identified, including: Right retropharyngeal lymph node measures 1.7 cm and has an SUV max of 14.78, image 17/4. Right parotid lymph node measures 2.6 cm within SUV max of 9.69. Right level 2 node measures 3.1 cm and has  an SUV max of 15.8, image 25/4. Right level 3/4 node measures 3.7 cm and has an SUV max of 15.34, image 42/4. Left level 2 node measures 3.2 cm and has an SUV max of 18.2. Incidental CT findings: none CHEST: Right supraclavicular node measures 0.7 cm within SUV max of 3.7, image 50/4. No FDG avid axillary, mediastinal or hilar lymph nodes. Incidental CT findings: No suspicious or FDG avid pulmonary nodules. Tiny nodule in the lateral right apex measures 3 mm and is too small to characterize by PET-CT, image 10/8. Aortic atherosclerosis. Coronary artery calcifications. ABDOMEN/PELVIS: No abnormal FDG uptake within the liver, pancreas, or spleen. No abnormal uptake within the adrenal glands. No hypermetabolic abdominopelvic lymph nodes. Incidental CT findings: Aortic atherosclerosis. Bilateral renal calculi. No hydronephrosis. SKELETON: No focal hypermetabolic activity to suggest skeletal metastasis. Incidental CT findings: none IMPRESSION: 1. There is intense FDG uptake within the area of increased soft tissue fullness in the posterior nasopharynx. Cannot exclude primary nasopharyngeal neoplasm. 2. Extensive, bulky bilateral FDG avid  cervical adenopathy. Large FDG avid lymph node is also identified within the right parotid gland. Imaging findings compatible with metastatic adenopathy. 3. Subcentimeter right supraclavicular lymph node exhibits mild FDG uptake above background activity. Equivocal for nodal metastasis. No additional signs of thoracic, abdominal, or pelvic metastasis. No evidence for osseous metastatic disease. Electronically Signed   By: Kerby Moors M.D.   On: 09/28/2020 18:24   VAS [place] LOWER EXTREMITY VENOUS (DVT)  Result Date: 10/04/2020  Lower Venous DVT Study Indications: History of swelling LT knee/calf.  Risk Factors: Cancer nasopharyngeal soft palate. Comparison Study: No prior studies. Performing Technologist: Darlin Coco RDMS,RVT  Examination Guidelines: A complete evaluation includes B-mode imaging, spectral Doppler, color Doppler, and power Doppler as needed of all accessible portions of each vessel. Bilateral testing is considered an integral part of a complete examination. Limited examinations for reoccurring indications may be performed as noted. The reflux portion of the exam is performed with the patient in reverse Trendelenburg.  +-----+---------------+---------+-----------+----------+--------------+ RIGHTCompressibilityPhasicitySpontaneityPropertiesThrombus Aging +-----+---------------+---------+-----------+----------+--------------+ CFV  Full           Yes      Yes                                 +-----+---------------+---------+-----------+----------+--------------+   +---------+---------------+---------+-----------+----------+--------------+ LEFT     CompressibilityPhasicitySpontaneityPropertiesThrombus Aging +---------+---------------+---------+-----------+----------+--------------+ CFV      Full           Yes      Yes                                 +---------+---------------+---------+-----------+----------+--------------+ SFJ      Full                                                         +---------+---------------+---------+-----------+----------+--------------+ FV Prox  Full                                                        +---------+---------------+---------+-----------+----------+--------------+ FV Mid   Full                                                        +---------+---------------+---------+-----------+----------+--------------+  FV DistalFull                                                        +---------+---------------+---------+-----------+----------+--------------+ PFV      Full                                                        +---------+---------------+---------+-----------+----------+--------------+ POP      Full           Yes      Yes                                 +---------+---------------+---------+-----------+----------+--------------+ PTV      Full                                                        +---------+---------------+---------+-----------+----------+--------------+ PERO     Full                                                        +---------+---------------+---------+-----------+----------+--------------+     Summary: RIGHT: - No evidence of common femoral vein obstruction.  LEFT: - There is no evidence of deep vein thrombosis in the lower extremity.  - No cystic structure found in the popliteal fossa.  *See table(s) above for measurements and observations. Electronically signed by Monica Martinez MD on 10/04/2020 at 4:54:22 PM.    Final     Labs:  CBC: Recent Labs    10/08/20 1250  WBC 5.4  HGB 14.2  HCT 44.5  PLT 257    COAGS: No results for input(s): INR, APTT in the last 8760 hours.  BMP: Recent Labs    10/08/20 1250  NA 143  K 4.1  CL 106  CO2 26  GLUCOSE 110*  BUN 24*  CALCIUM 9.2  CREATININE 1.29*  GFRNONAA >60    LIVER FUNCTION TESTS: Recent Labs    10/08/20 1250  BILITOT 0.5  AST 16  ALT 16  ALKPHOS 96  PROT 9.0*  ALBUMIN 4.1     Assessment and Plan:  65 y.o. male outpatient. History of HTN, HLD,  chest pain of unknown etiology. 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 Pet scan from 3.26.22 shows multiple lymph nodes in the neck as  hypermetabolic and PET avid right supraclavicular lymph node and no lines or drains. Team is requesting a portacath placement.  BUN 24 and Cr 1.29 from labs on 4.5.22. All other labs and medications are within acceptable parameters. NKDA. Patient has been NPO since midnight. Risks and benefits of image guided port-a-catheter placement was discussed with the patient including, but not limited to bleeding, infection, pneumothorax, or fibrin sheath development and need for additional procedures.  All of the patient's questions  were answered, patient is agreeable to proceed. Consent signed and in chart.   Thank you for this interesting consult.  I greatly enjoyed meeting Alan Ausburn Sr. and look forward to participating in their care.  A copy of this report was sent to the requesting provider on this date.  Electronically Signed: Jacqualine Mau, NP 10/18/2020, 7:53 AM   I spent a total of  30 Minutes   in face to face in clinical consultation, greater than 50% of which was counseling/coordinating care for portacath placement

## 2020-10-18 NOTE — Progress Notes (Signed)
NSL removed left hand.  Cath intact site U.

## 2020-10-18 NOTE — Procedures (Signed)
Interventional Radiology Procedure Note  Procedure: Placement of a left IJ approach single lumen PowerPort.  Tip is positioned at the superior cavoatrial junction and catheter is ready for immediate use.   Complications: No immediate  EBL: None  Recommendations:  - Ok to shower tomorrow - Do not submerge for 7 days - Routine line care   Signed,  Criselda Peaches, MD

## 2020-10-21 DIAGNOSIS — Z510 Encounter for antineoplastic radiation therapy: Secondary | ICD-10-CM | POA: Diagnosis not present

## 2020-10-22 ENCOUNTER — Encounter (HOSPITAL_COMMUNITY): Payer: Self-pay | Admitting: Surgery

## 2020-10-22 ENCOUNTER — Other Ambulatory Visit (HOSPITAL_COMMUNITY)
Admission: RE | Admit: 2020-10-22 | Discharge: 2020-10-22 | Disposition: A | Discharge status: Home / Self Care | Payer: 59 | Source: Ambulatory Visit | Attending: Surgery | Admitting: Surgery

## 2020-10-22 ENCOUNTER — Other Ambulatory Visit: Payer: Self-pay

## 2020-10-22 ENCOUNTER — Ambulatory Visit: Payer: 59 | Admitting: Medical

## 2020-10-22 ENCOUNTER — Other Ambulatory Visit: Payer: 59

## 2020-10-22 DIAGNOSIS — Z01812 Encounter for preprocedural laboratory examination: Secondary | ICD-10-CM | POA: Insufficient documentation

## 2020-10-22 DIAGNOSIS — Z20822 Contact with and (suspected) exposure to covid-19: Secondary | ICD-10-CM | POA: Insufficient documentation

## 2020-10-22 LAB — SARS CORONAVIRUS 2 (TAT 6-24 HRS): SARS Coronavirus 2: NEGATIVE

## 2020-10-22 NOTE — Progress Notes (Addendum)
COVID Vaccine Completed: x3 Date COVID Vaccine completed:  10-05-19, 11-07-19 Has received booster:  06-24-20 COVID vaccine manufacturer: [employer]   Date of COVID positive in last 90 days:  N/A  PCP - Marica Otter, MD Cardiologist - Quay Burow, MD  Chest x-ray - N/A EKG - 11-14-19 Epic Stress Test - N/A ECHO - 10-17-19 on chart Cardiac Cath - N/A Pacemaker/ICD device last checked: Spinal Cord Stimulator:  Sleep Study - 2021, +sleep apnea CPAP - Not currently, but has been ordered  Fasting Blood Sugar - N/A Checks Blood Sugar _____ times a day  Blood Thinner Instructions: N/A Aspirin Instructions: Last Dose:  Activity level:  Can go up a flight of stairs and perform activities of daily living without stopping and without symptoms of chest pain or shortness of breath.       Anesthesia review:  Evaluated by cardiology for chest pain/tightness.  Patient states that he has not had any episodes of chest tightness/pain in several months.     Patient states currently no difficulty swallowing with cancer diagnosis.    Patient denies shortness of breath, fever, cough and chest pain at PAT appointment (completed over the phone)   Patient verbalized understanding of instructions that were given to them at the PAT appointment. Patient was also instructed that they will need to review over the PAT instructions again at home before surgery.

## 2020-10-22 NOTE — Progress Notes (Signed)
Anesthesia Chart Review: SAME DAY WORK-UP    Case: [id number] Date/Time: 10/23/20 1100   Procedure: LAPAROSCOPIC ASSISTED PEG TUBE (N/A ) - 60   Anesthesia type: General   Pre-op diagnosis: NASOPHARYNGEAL CANCER   Location: [facility] ROOM 04 / [facility]   Surgeons: Dwan Bolt, MD      DISCUSSION: Patient is a 65 year old male scheduled for the above procedure. He has a diagnosis of nasopharyngeal SCC and will be starting chemotherapy in the near future. IR was consulted for percutaneous G-tube placement but they recommended placement by general surgery because there was colon between the stomach and abdominal wall. Per H&P, observation overnight is anticipated.   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 S/p left IJ single lumen PowerPort 10/18/20.  He was referred to cardiologist Dr. Gwenlyn Found for diagnostic coronary angiography in May 2021 by cardiologist Dr. Claudie Leach due to exertional chest pain with family history of CAD (sister s/p CABG age 61). I called and spoke with Mr. Strozier since I don't see that he ever went through with his cardiac cath. He says that Dr. Jeanie Cooks referred him to Dr. Claudie Leach because he was getting chest tightness when he felt anxious or was in a rush. He says symptoms overall improved, although not completely resolved, so he did not go through with cardiac cath. He considers episodes "rare" but says they may occur up to once/month, last being ~ a month ago. He does not think that he has seen Dr. Claudie Leach or Dr. Gwenlyn Found since 11/2019. He 10/2019 echo was unremarkable. He had not had to take Nitro.  He describes his chest pain as chest tightness that will sporadically occur when he feels anxious or rushed. As soon as he notices the tightness he will stop and within seconds it goes away. No associated SOB, dizziness, diaphoresis.  As stated, symptoms can occur when feeling anxious and rushed but  then there are times when he is even more active and will not get any CV symptoms at all. He denied SOB, syncope, palpitations. 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 No conversational dyspnea noted during phone call.    Says he had a sleep study through [facility] and is waiting for CPAP. By 02/02/20 note by Lucky Cowboy, FNP-C, HST AHI 33 auto CPAP 4-20 and 02/02/20 Spirometry showed FEV1 85.8%.   [place] COVID-19 #3 06/24/20. 10/22/20 preprocedure COVID-19 test in process.   Discussed available information with anesthesiologist Nolon Nations, MD, including cardiology records. Overall patient feels chest symptoms have improved since last year, but can still happen sporadically. 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 He needs to start chemoradiation therapy but needs gastrostomy which could not be placed by IR due to anatomy. Assigned anesthesiologist to evaluate on the day of surgery.    VS: Ht 5' 9.5" (1.765 m)   Wt 84.6 kg   BMI 27.15 kg/m   BP Readings from Last 3 Encounters:  10/18/20 (!) 142/83  10/15/20 133/75  10/11/20 122/80   Pulse Readings from Last 3 Encounters:  10/18/20 86  10/15/20 86  10/11/20 75    PROVIDERS: Nolene Ebbs, MD is PCP (seen at both office locations) - Benay Pike, MD is HEM-ONC. Last visit 10/15/20. Plans to start weekly cisplatin. For  port (left IJ 10/18/20) and PEG.  Eppie Gibson, MD is RAD-ONC - [name], MD is ENT - He was evaluated by cardiologist Lawson Radar, MD with [facility] ~ Spring 2021 for exertional chest pain with family history of earl onset CAD (office note scanned under Media tab). He was referred to cardiologist Quay Burow, MD for [facility] with visit on 11/14/19. Patient never went through with cardiac cath as he felt symptoms overall improved.     LABS: Currently, most recent lab results are from 10/08/20 and include: Lab Results  Component Value Date   WBC 5.4 10/08/2020   HGB 14.2 10/08/2020   HCT 44.5 10/08/2020   PLT 257 10/08/2020   GLUCOSE 110 (H) 10/08/2020   ALT 16 10/08/2020   AST 16 10/08/2020   NA 143 10/08/2020   K 4.1 10/08/2020   CL 106 10/08/2020   CREATININE 1.29 (H) 10/08/2020   BUN 24 (H) 10/08/2020   CO2 26 10/08/2020   TSH 0.818 10/08/2020     IMAGES: PET Scan 09/27/20: IMPRESSION: 1. There is intense FDG uptake within the area of increased soft tissue fullness in the posterior nasopharynx. Cannot exclude primary nasopharyngeal neoplasm. 2. Extensive, bulky bilateral FDG avid cervical adenopathy. Large FDG avid lymph node is also identified within the right parotid gland. Imaging findings compatible with metastatic adenopathy. 3. Subcentimeter right supraclavicular lymph node exhibits mild FDG uptake above background activity. Equivocal for nodal metastasis. No additional signs of thoracic, abdominal, or pelvic metastasis. No evidence for osseous metastatic disease.  CT soft tissue neck 08/07/20: IMPRESSION: Nasopharyngeal soft tissue prominence, greatest to the right. Soft tissue effacement of the adjacent right parapharyngeal fat, suspected to at least partially reflect an enlarged right retropharyngeal lymph node. Bulky bilateral cervical, and likely right intraparotid, lymphadenopathy. Primary differential considerations for this constellation of findings: nasopharyngeal carcinoma with metastatic lymphadenopathy versus lymphoma. Consider direct tissue sampling.   EKG: 11/14/19: NSR. T wave abnormality, consider inferolateral ischemia (versus LVH with repolarization changes)   CV: BLE [place] 10/04/20: Summary:  RIGHT:  - No evidence of common femoral vein obstruction.  LEFT:  - There is no evidence of deep vein thrombosis in the lower extremity.  - No cystic structure found in the  popliteal fossa.   Echo 10/17/19 ([place]): Conclusions: 1.  The left ventricle size is normal. 2.  There is mild concentric left ventricular hypertrophy. 3.  Overall left ventricular systolic function is normal with an EF between 60 and 65%. 4.  Normal cardiac chamber sizes and function; normal valve anatomy and function; no pericardial effusion or intracardiac mass.  No intracardiac shunts by 2D and color-flow imaging.  Normal thoracic aorta and aortic arch.   Past Medical History:  Diagnosis Date  . Chest pain    2021  . History of kidney stones   . Hypertension   . Nasopharyngeal cancer ([place])   . Pneumonia   . Sleep apnea     Past Surgical History:  Procedure Laterality Date  . FINE NEEDLE ASPIRATION BIOPSY    . HERNIA REPAIR     Umbilicatl hernia  . IR IMAGING GUIDED PORT INSERTION  10/18/2020  . ROTATOR CUFF REPAIR    . Torn Labrum      MEDICATIONS: No current facility-administered medications for this encounter.   Marland Kitchen amLODipine (NORVASC) 10 MG tablet  . atorvastatin (LIPITOR) 80 MG tablet  . cetirizine (ZYRTEC) 10 MG tablet  . dexamethasone (DECADRON) 4 MG tablet  . ergocalciferol (VITAMIN D2)  1.25 MG (50000 UT) capsule  . lidocaine-prilocaine (EMLA) cream  . LORazepam (ATIVAN) 0.5 MG tablet  . nitroGLYCERIN (NITROSTAT) 0.4 MG SL tablet  . omeprazole (PRILOSEC) 20 MG capsule  . ondansetron (ZOFRAN) 8 MG tablet  . prochlorperazine (COMPAZINE) 10 MG tablet  . sildenafil (VIAGRA) 50 MG tablet   - He has not yet started dexamethasone, EMLA, Ativan, Compazine (prescribed around chemotherapy).   Myra Gianotti, PA-C Surgical Short Stay/Anesthesiology [facility] Phone [phone] [facility] Phone [phone] 10/22/2020 2:55 PM

## 2020-10-22 NOTE — Anesthesia Preprocedure Evaluation (Addendum)
Anesthesia Evaluation  Patient identified by MRN, date of birth, ID band Patient awake    Reviewed: Allergy & Precautions, NPO status , Patient's Chart, lab work & pertinent test results, reviewed documented beta blocker date and time   Airway Mallampati: II  TM Distance: >3 FB Neck ROM: Full    Dental no notable dental hx. (+) Teeth Intact, Caps, Dental Advisory Given   Pulmonary sleep apnea and Continuous Positive Airway Pressure Ventilation , pneumonia, resolved,  Nasopharyngeal Ca (soft palate)   Pulmonary exam normal breath sounds clear to auscultation       Cardiovascular hypertension, Pt. on medications + CAD  Normal cardiovascular exam Rhythm:Regular Rate:Normal  Echo 10/17/19 ([place]): Conclusions: 1.  The left ventricle size is normal. 2.  There is mild concentric left ventricular hypertrophy. 3.  Overall left ventricular systolic function is normal with an EF between 60 and 65%. 4.  Normal cardiac chamber sizes and function; normal valve anatomy and function; no pericardial effusion or intracardiac mass.  No intracardiac shunts by 2D and color-flow imaging.  Normal thoracic aorta and aortic arch.     Neuro/Psych negative neurological ROS  negative psych ROS   GI/Hepatic negative GI ROS, Neg liver ROS,   Endo/Other  Hyperlipidemia  Renal/GU Renal InsufficiencyRenal diseaseHx/o renal calculi  negative genitourinary   Musculoskeletal negative musculoskeletal ROS (+)   Abdominal   Peds  Hematology negative hematology ROS (+)   Anesthesia Other Findings   Reproductive/Obstetrics ED                           Anesthesia Physical Anesthesia Plan  ASA: III  Anesthesia Plan: General   Post-op Pain Management:    Induction: Intravenous  PONV Risk Score and Plan: 4 or greater and Treatment may vary due to age or medical condition, Midazolam, Ondansetron and  Dexamethasone  Airway Management Planned: Oral ETT and Video Laryngoscope Planned  Additional Equipment:   Intra-op Plan:   Post-operative Plan: Extubation in OR  Informed Consent: I have reviewed the patients History and Physical, chart, labs and discussed the procedure including the risks, benefits and alternatives for the proposed anesthesia with the patient or authorized representative who has indicated his/her understanding and acceptance.     Dental advisory given  Plan Discussed with: CRNA and Anesthesiologist  Anesthesia Plan Comments: (See PST note written 10/22/2020 by Myra Gianotti, PA-C. )      Anesthesia Quick Evaluation

## 2020-10-23 ENCOUNTER — Ambulatory Visit (HOSPITAL_COMMUNITY): Payer: 59 | Admitting: Vascular Surgery

## 2020-10-23 ENCOUNTER — Ambulatory Visit: Payer: 59

## 2020-10-23 ENCOUNTER — Encounter: Payer: 59 | Admitting: Nutrition

## 2020-10-23 ENCOUNTER — Ambulatory Visit: Payer: 59 | Admitting: Radiation Oncology

## 2020-10-23 ENCOUNTER — Encounter (HOSPITAL_COMMUNITY): Payer: Self-pay | Admitting: Surgery

## 2020-10-23 ENCOUNTER — Encounter (HOSPITAL_COMMUNITY)
Admission: RE | Disposition: A | Discharge status: Home / Self Care | Payer: Self-pay | Source: Home / Self Care | Attending: Surgery

## 2020-10-23 ENCOUNTER — Observation Stay (HOSPITAL_COMMUNITY)
Admission: RE | Admit: 2020-10-23 | Discharge: 2020-10-24 | Disposition: A | Discharge status: Home / Self Care | Payer: 59 | Attending: Surgery | Admitting: Surgery

## 2020-10-23 DIAGNOSIS — C119 Malignant neoplasm of nasopharynx, unspecified: Secondary | ICD-10-CM | POA: Diagnosis present

## 2020-10-23 DIAGNOSIS — I10 Essential (primary) hypertension: Secondary | ICD-10-CM | POA: Diagnosis not present

## 2020-10-23 DIAGNOSIS — C113 Malignant neoplasm of anterior wall of nasopharynx: Secondary | ICD-10-CM

## 2020-10-23 DIAGNOSIS — Z79899 Other long term (current) drug therapy: Secondary | ICD-10-CM | POA: Diagnosis not present

## 2020-10-23 DIAGNOSIS — Z96612 Presence of left artificial shoulder joint: Secondary | ICD-10-CM | POA: Insufficient documentation

## 2020-10-23 HISTORY — PX: LAPAROSCOPIC INSERTION GASTROSTOMY TUBE: SHX6817

## 2020-10-23 HISTORY — DX: Malignant neoplasm of nasopharynx, unspecified: C11.9

## 2020-10-23 HISTORY — DX: Personal history of urinary calculi: Z87.442

## 2020-10-23 HISTORY — DX: Sleep apnea, unspecified: G47.30

## 2020-10-23 HISTORY — DX: Essential (primary) hypertension: I10

## 2020-10-23 HISTORY — DX: Pneumonia, unspecified organism: J18.9

## 2020-10-23 HISTORY — DX: Chest pain, unspecified: R07.9

## 2020-10-23 LAB — BASIC METABOLIC PANEL
Anion gap: 8 (ref 5–15)
BUN: 27 mg/dL — ABNORMAL HIGH (ref 8–23)
CO2: 27 mmol/L (ref 22–32)
Calcium: 9.5 mg/dL (ref 8.9–10.3)
Chloride: 103 mmol/L (ref 98–111)
Creatinine, Ser: 1.33 mg/dL — ABNORMAL HIGH (ref 0.61–1.24)
GFR, Estimated: >60 mL/min (ref >60)
Glucose, Bld: 126 mg/dL — ABNORMAL HIGH (ref 70–99)
Potassium: 3.9 mmol/L (ref 3.5–5.1)
Sodium: 138 mmol/L (ref 135–145)

## 2020-10-23 LAB — CBC
HCT: 44.9 % (ref 39.0–52.0)
Hemoglobin: 14.6 g/dL (ref 13.0–17.0)
MCH: 29.5 pg (ref 26.0–34.0)
MCHC: 32.5 g/dL (ref 30.0–36.0)
MCV: 90.7 fL (ref 80.0–100.0)
Platelets: 215 10*3/uL (ref 150–400)
RBC: 4.95 MIL/uL (ref 4.22–5.81)
RDW: 15.2 % (ref 11.5–15.5)
WBC: 5.7 10*3/uL (ref 4.0–10.5)
nRBC: 0 % (ref 0.0–0.2)

## 2020-10-23 SURGERY — INSERTION, GASTROSTOMY TUBE, PERCUTANEOUS
Anesthesia: General | Site: Abdomen

## 2020-10-23 MED ORDER — PROPOFOL 10 MG/ML IV BOLUS
INTRAVENOUS | Status: AC
  Filled 2020-10-23: qty 20

## 2020-10-23 MED ORDER — HYDROMORPHONE HCL 1 MG/ML IJ SOLN
INTRAMUSCULAR | Status: AC
  Filled 2020-10-23: qty 1

## 2020-10-23 MED ORDER — OXYCODONE HCL 5 MG PO TABS
5.0000 mg | ORAL_TABLET | ORAL | Status: DC | PRN
  Administered 2020-10-23: 5 mg via ORAL
  Filled 2020-10-23: qty 1

## 2020-10-23 MED ORDER — CEFAZOLIN SODIUM-DEXTROSE 2-4 GM/100ML-% IV SOLN
2.0000 g | INTRAVENOUS | Status: AC
  Administered 2020-10-23: 2 g via INTRAVENOUS
  Filled 2020-10-23: qty 100

## 2020-10-23 MED ORDER — MIDAZOLAM HCL 2 MG/2ML IJ SOLN
INTRAMUSCULAR | Status: AC
  Filled 2020-10-23: qty 2

## 2020-10-23 MED ORDER — DEXTROSE-NACL 5-0.45 % IV SOLN
INTRAVENOUS | Status: DC
  Administered 2020-10-23: 1000 mL via INTRAVENOUS

## 2020-10-23 MED ORDER — DEXAMETHASONE SODIUM PHOSPHATE 10 MG/ML IJ SOLN
INTRAMUSCULAR | Status: DC | PRN
  Administered 2020-10-23: 5 mg via INTRAVENOUS

## 2020-10-23 MED ORDER — PHENYLEPHRINE 40 MCG/ML (10ML) SYRINGE FOR IV PUSH (FOR BLOOD PRESSURE SUPPORT)
PREFILLED_SYRINGE | INTRAVENOUS | Status: DC | PRN
  Administered 2020-10-23: 120 ug via INTRAVENOUS
  Administered 2020-10-23 (×2): 80 ug via INTRAVENOUS

## 2020-10-23 MED ORDER — ATORVASTATIN CALCIUM 80 MG PO TABS
80.0000 mg | ORAL_TABLET | Freq: Every day | ORAL | Status: DC
  Administered 2020-10-24: 80 mg via ORAL
  Filled 2020-10-23: qty 2
  Filled 2020-10-23: qty 1

## 2020-10-23 MED ORDER — DEXAMETHASONE SODIUM PHOSPHATE 10 MG/ML IJ SOLN
INTRAMUSCULAR | Status: AC
  Filled 2020-10-23: qty 1

## 2020-10-23 MED ORDER — ONDANSETRON HCL 4 MG/2ML IJ SOLN
INTRAMUSCULAR | Status: AC
  Filled 2020-10-23: qty 2

## 2020-10-23 MED ORDER — ONDANSETRON HCL 4 MG/2ML IJ SOLN: 4.0000 mg | Freq: Once | INTRAMUSCULAR | Status: DC | PRN

## 2020-10-23 MED ORDER — ORAL CARE MOUTH RINSE: 15.0000 mL | Freq: Once | OROMUCOSAL | Status: AC

## 2020-10-23 MED ORDER — FENTANYL CITRATE (PF) 100 MCG/2ML IJ SOLN
INTRAMUSCULAR | Status: AC
  Filled 2020-10-23: qty 2

## 2020-10-23 MED ORDER — CHLORHEXIDINE GLUCONATE 0.12 % MT SOLN
15.0000 mL | Freq: Once | OROMUCOSAL | Status: AC
  Administered 2020-10-23: 15 mL via OROMUCOSAL

## 2020-10-23 MED ORDER — BUPIVACAINE-EPINEPHRINE (PF) 0.25% -1:200000 IJ SOLN
INTRAMUSCULAR | Status: AC
  Filled 2020-10-23: qty 30

## 2020-10-23 MED ORDER — ENOXAPARIN SODIUM 40 MG/0.4ML ~~LOC~~ SOLN
40.0000 mg | SUBCUTANEOUS | Status: DC
  Administered 2020-10-23: 40 mg via SUBCUTANEOUS
  Filled 2020-10-23: qty 0.4

## 2020-10-23 MED ORDER — PROPOFOL 10 MG/ML IV BOLUS
INTRAVENOUS | Status: DC | PRN
  Administered 2020-10-23: 160 mg via INTRAVENOUS

## 2020-10-23 MED ORDER — ESMOLOL HCL 100 MG/10ML IV SOLN
INTRAVENOUS | Status: DC | PRN
  Administered 2020-10-23: 20 mg via INTRAVENOUS

## 2020-10-23 MED ORDER — ONDANSETRON HCL 4 MG/2ML IJ SOLN
4.0000 mg | Freq: Four times a day (QID) | INTRAMUSCULAR | Status: DC | PRN
Start: 2020-10-23 — End: 2020-10-24

## 2020-10-23 MED ORDER — 0.9 % SODIUM CHLORIDE (POUR BTL) OPTIME
TOPICAL | Status: DC | PRN
  Administered 2020-10-23: 1000 mL

## 2020-10-23 MED ORDER — PANTOPRAZOLE SODIUM 40 MG PO TBEC
40.0000 mg | DELAYED_RELEASE_TABLET | Freq: Every day | ORAL | Status: DC
  Administered 2020-10-24: 40 mg via ORAL
  Filled 2020-10-23: qty 1

## 2020-10-23 MED ORDER — EPHEDRINE SULFATE-NACL 50-0.9 MG/10ML-% IV SOSY
PREFILLED_SYRINGE | INTRAVENOUS | Status: DC | PRN
  Administered 2020-10-23: 5 mg via INTRAVENOUS
  Administered 2020-10-23: 10 mg via INTRAVENOUS

## 2020-10-23 MED ORDER — DOCUSATE SODIUM 100 MG PO CAPS
100.0000 mg | ORAL_CAPSULE | Freq: Two times a day (BID) | ORAL | Status: DC
  Administered 2020-10-23 – 2020-10-24 (×2): 100 mg via ORAL
  Filled 2020-10-23 (×2): qty 1

## 2020-10-23 MED ORDER — ONDANSETRON 4 MG PO TBDP: 4.0000 mg | ORAL_TABLET | Freq: Four times a day (QID) | ORAL | Status: DC | PRN

## 2020-10-23 MED ORDER — ONDANSETRON HCL 4 MG/2ML IJ SOLN
INTRAMUSCULAR | Status: DC | PRN
  Administered 2020-10-23: 4 mg via INTRAVENOUS

## 2020-10-23 MED ORDER — KETOROLAC TROMETHAMINE 30 MG/ML IJ SOLN
INTRAMUSCULAR | Status: AC
  Filled 2020-10-23: qty 1

## 2020-10-23 MED ORDER — LACTATED RINGERS IV SOLN: INTRAVENOUS | Status: DC

## 2020-10-23 MED ORDER — HYDROMORPHONE HCL 1 MG/ML IJ SOLN
0.2500 mg | INTRAMUSCULAR | Status: DC | PRN
  Administered 2020-10-23: 0.25 mg via INTRAVENOUS
  Administered 2020-10-23: 0.5 mg via INTRAVENOUS

## 2020-10-23 MED ORDER — DEXMEDETOMIDINE (PRECEDEX) IN NS 20 MCG/5ML (4 MCG/ML) IV SYRINGE
PREFILLED_SYRINGE | INTRAVENOUS | Status: DC | PRN
  Administered 2020-10-23: 8 ug via INTRAVENOUS
  Administered 2020-10-23 (×3): 4 ug via INTRAVENOUS

## 2020-10-23 MED ORDER — MIDAZOLAM HCL 5 MG/5ML IJ SOLN
INTRAMUSCULAR | Status: DC | PRN
  Administered 2020-10-23: 2 mg via INTRAVENOUS

## 2020-10-23 MED ORDER — FENTANYL CITRATE (PF) 100 MCG/2ML IJ SOLN
INTRAMUSCULAR | Status: DC | PRN
  Administered 2020-10-23 (×2): 50 ug via INTRAVENOUS

## 2020-10-23 MED ORDER — SUGAMMADEX SODIUM 200 MG/2ML IV SOLN
INTRAVENOUS | Status: DC | PRN
  Administered 2020-10-23: 175 mg via INTRAVENOUS

## 2020-10-23 MED ORDER — ROCURONIUM BROMIDE 100 MG/10ML IV SOLN
INTRAVENOUS | Status: DC | PRN
  Administered 2020-10-23: 60 mg via INTRAVENOUS

## 2020-10-23 MED ORDER — LIDOCAINE 2% (20 MG/ML) 5 ML SYRINGE
INTRAMUSCULAR | Status: DC | PRN
  Administered 2020-10-23: 60 mg via INTRAVENOUS

## 2020-10-23 MED ORDER — LIDOCAINE 2% (20 MG/ML) 5 ML SYRINGE
INTRAMUSCULAR | Status: AC
  Filled 2020-10-23: qty 5

## 2020-10-23 MED ORDER — ACETAMINOPHEN 500 MG PO TABS
1000.0000 mg | ORAL_TABLET | Freq: Three times a day (TID) | ORAL | Status: DC
  Administered 2020-10-23 – 2020-10-24 (×4): 1000 mg via ORAL
  Filled 2020-10-23 (×4): qty 2

## 2020-10-23 SURGICAL SUPPLY — 58 items
ADH SKN CLS APL DERMABOND .7 (GAUZE/BANDAGES/DRESSINGS) ×1
APL PRP STRL LF DISP 70% ISPRP (MISCELLANEOUS) ×1
BAG DRN LRG CPC RND TRDRP CNTR (MISCELLANEOUS) ×1
BAG URO DRAIN 4000ML (MISCELLANEOUS) ×1 IMPLANT
BINDER ABDOMINAL 12 ML 46-62 (SOFTGOODS) ×1 IMPLANT
CABLE HIGH FREQUENCY MONO STRZ (ELECTRODE) ×2 IMPLANT
CHLORAPREP W/TINT 26 (MISCELLANEOUS) ×2 IMPLANT
COVER SURGICAL LIGHT HANDLE (MISCELLANEOUS) ×2 IMPLANT
COVER WAND RF STERILE (DRAPES) IMPLANT
DECANTER SPIKE VIAL GLASS SM (MISCELLANEOUS) ×2 IMPLANT
DERMABOND ADVANCED (GAUZE/BANDAGES/DRESSINGS) ×1
DERMABOND ADVANCED .7 DNX12 (GAUZE/BANDAGES/DRESSINGS) IMPLANT
DEVICE SUTURE ENDOST 10MM (ENDOMECHANICALS) IMPLANT
DRAIN CHANNEL 19F RND (DRAIN) IMPLANT
DRAIN PENROSE 0.5X18 (DRAIN) IMPLANT
DRAPE LAPAROSCOPIC ABDOMINAL (DRAPES) ×2 IMPLANT
ELECT REM PT RETURN 15FT ADLT (MISCELLANEOUS) ×2 IMPLANT
EVACUATOR SILICONE 100CC (DRAIN) IMPLANT
GAUZE SPONGE 4X4 12PLY STRL (GAUZE/BANDAGES/DRESSINGS) IMPLANT
GLOVE SURG POLYISO LF SZ7 (GLOVE) ×2 IMPLANT
GLOVE SURG UNDER POLY LF SZ7 (GLOVE) ×2 IMPLANT
GOWN STRL REUS W/TWL LRG LVL3 (GOWN DISPOSABLE) ×2 IMPLANT
GOWN STRL REUS W/TWL XL LVL3 (GOWN DISPOSABLE) ×4 IMPLANT
GRASPER SUT TROCAR 14GX15 (MISCELLANEOUS) IMPLANT
IRRIG SUCT STRYKERFLOW 2 WTIP (MISCELLANEOUS) ×2
IRRIGATION SUCT STRKRFLW 2 WTP (MISCELLANEOUS) ×1 IMPLANT
KIT BASIN OR (CUSTOM PROCEDURE TRAY) ×2 IMPLANT
KIT INTRO ENDO 22F F/GAST TUBE (SET/KITS/TRAYS/PACK) IMPLANT
KIT INTRODUCER MIC G-18 (SET/KITS/TRAYS/PACK) IMPLANT
KIT TURNOVER KIT A (KITS) ×2 IMPLANT
PAD POSITIONING PINK XL (MISCELLANEOUS) IMPLANT
PENCIL SMOKE EVACUATOR (MISCELLANEOUS) IMPLANT
RELOAD ENDO STITCH 2.0 (ENDOMECHANICALS)
RELOAD SUT SNGL STCH BLK 2-0 (ENDOMECHANICALS) IMPLANT
SCISSORS LAP 5X35 DISP (ENDOMECHANICALS) ×2 IMPLANT
SET TUBE SMOKE EVAC HIGH FLOW (TUBING) ×2 IMPLANT
SHEARS HARMONIC ACE PLUS 36CM (ENDOMECHANICALS) ×2 IMPLANT
SLEEVE XCEL OPT CAN 5 100 (ENDOMECHANICALS) ×4 IMPLANT
SPONGE DRAIN TRACH 4X4 STRL 2S (GAUZE/BANDAGES/DRESSINGS) IMPLANT
SUT ETHILON 2 0 PS N (SUTURE) ×1 IMPLANT
SUT ETHILON 3 0 PS 1 (SUTURE) IMPLANT
SUT MNCRL AB 4-0 PS2 18 (SUTURE) ×2 IMPLANT
SUT RELOAD ENDO STITCH 2.0 (ENDOMECHANICALS)
SUT SILK 2 0 SH (SUTURE) IMPLANT
SUT SILK 3 0 SH 30 (SUTURE) IMPLANT
SUT SURGIDAC NAB ES-9 0 48 120 (SUTURE) IMPLANT
SUT VIC AB 2-0 SH 27 (SUTURE)
SUT VIC AB 2-0 SH 27X BRD (SUTURE) IMPLANT
SUT VIC AB 3-0 SH 27 (SUTURE)
SUT VIC AB 3-0 SH 27XBRD (SUTURE) IMPLANT
SUT VICRYL 0 TIES 12 18 (SUTURE) IMPLANT
SUTURE RELOAD ENDO STITCH 2.0 (ENDOMECHANICALS) IMPLANT
TOWEL OR 17X26 10 PK STRL BLUE (TOWEL DISPOSABLE) ×2 IMPLANT
TOWEL OR NON WOVEN STRL DISP B (DISPOSABLE) ×2 IMPLANT
TRAY LAPAROSCOPIC (CUSTOM PROCEDURE TRAY) ×2 IMPLANT
TROCAR BLADELESS OPT 5 100 (ENDOMECHANICALS) ×2 IMPLANT
TROCAR XCEL 12X100 BLDLESS (ENDOMECHANICALS) ×2 IMPLANT
TUBE GASTROSTOMY 18F (CATHETERS) IMPLANT

## 2020-10-23 NOTE — Interval H&P Note (Signed)
History and Physical Interval Note:  10/23/2020 9:47 AM  Alan Leech Sr.  has presented today for surgery, with the diagnosis of NASOPHARYNGEAL CANCER.  The various methods of treatment have been discussed with the patient and family. After consideration of risks, benefits and other options for treatment, the patient has consented to  Procedure(s) with comments: LAPAROSCOPIC ASSISTED PEG TUBE (N/A) - 60 as a surgical intervention.  Discussed the possibility of an open Stamm gastrostomy if unable to pass EGD or unable to find a safe window laparoscopically/endoscopically. The patient's history has been reviewed, patient examined, no change in status, stable for surgery.  I have reviewed the patient's chart and labs.  Questions were answered to the patient's satisfaction. Patient will be admitted for overnight observation postoperatively.   Dwan Bolt

## 2020-10-23 NOTE — Anesthesia Postprocedure Evaluation (Signed)
Anesthesia Post Note  Patient: Alan MONIER Sr.  Procedure(s) Performed: LAPAROSCOPIC ASSISTED PEG TUBE (N/A Abdomen)     Anesthesia Post Evaluation No complications documented.  Last Vitals:  Vitals:   10/23/20 1400 10/23/20 1415  BP: 117/74 119/70  Pulse: 73 69  Resp: 14 10  Temp:    SpO2: 97% 96%    Last Pain:  Vitals:   10/23/20 1415  TempSrc:   PainSc: Asleep                 Jennell Janosik A.

## 2020-10-23 NOTE — Anesthesia Procedure Notes (Signed)
Procedure Name: Intubation Date/Time: 10/23/2020 12:09 PM Performed by: Gwyndolyn Saxon, CRNA Pre-anesthesia Checklist: Patient identified, Emergency Drugs available, Suction available and Patient being monitored Patient Re-evaluated:Patient Re-evaluated prior to induction Oxygen Delivery Method: Circle system utilized Preoxygenation: Pre-oxygenation with 100% oxygen Induction Type: IV induction Ventilation: Mask ventilation without difficulty and Oral airway inserted - appropriate to patient size Laryngoscope Size: Glidescope and 4 Grade View: Grade I Tube type: Oral Tube size: 7.5 mm Number of attempts: 1 Airway Equipment and Method: Patient positioned with wedge pillow and Rigid stylet Placement Confirmation: ETT inserted through vocal cords under direct vision,  positive ETCO2 and breath sounds checked- equal and bilateral Secured at: 22 cm Tube secured with: Tape Dental Injury: Teeth and Oropharynx as per pre-operative assessment

## 2020-10-23 NOTE — Op Note (Signed)
Date: 10/23/20  Patient: Alan COZZOLINO Sr. MRN: [id number]  Preoperative Diagnosis: Nasopharyngeal cancer Postoperative Diagnosis: Same  Procedure: Laparoscopic-assisted percutaneous endoscopic gastrostomy tube placement  Surgeon: Michaelle Birks, MD Assistant: Melina Modena, PA-C   EBL: Minimal  Anesthesia: General  Specimens: None  Indications: Mr. 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 He is to start chemoradiation and placement of a gastrostomy tube was requested prior to starting treatment. On review of his imaging, the colon is positioned between the stomach and abdominal wall, so the decision was made to place a PEG with laparoscopic guidance.  Findings: 20-Fr G tube placed in the body of the stomach, with appropriate positioning confirmed both endoscopically and laparoscopically. Tube was secured to the skin at 4.5cm.  Procedure details: Informed consent was obtained in the preoperative area prior to the procedure. The patient was brought to the operating room and placed on the table in the supine position. General anesthesia was induced and appropriate lines and drains were placed for intraoperative monitoring. Perioperative antibiotics were administered per SCIP guidelines. The abdomen was prepped and draped in the usual sterile fashion. A pre-procedure timeout was taken verifying patient identity, surgical site and procedure to be performed.  A small infraumbilical skin incision was made, the subcutaneous tissue was spread, and the umbilical stalk was grasped. A Veress needle was inserted through the fascia, intraperitoneal placement was confirmed with the saline drop test, and the abdomen was insufflated. A 25mm port was inserted, and the abdomen was inspected with no evidence of visceral or vascular injury. Next an EGD scope was passed through the oropharynx and advanced through the esophagus into the stomach. The stomach was  insufflated and visualized laparoscopically, and there was no colon interposed between the stomach and abdominal wall. The pneumoperitoneal pressure was decreased to 40mmHg to allow the stomach to appose with the abdominal wall. A spot on the LUQ abdominal wall overlying the body of the stomach about 2cm below the costal margin was selected for placement of the G tube and a small incision was made. A needle was inserted through the abdominal wall into the stomach and visualized endoscopically. A guidewire was then threaded through the needle, and grasped using an endoscopic snare. The scope was then withdrawn to pull the guidewire out through the patient's mouth. A 20-Fr PEG tube was secured to the end of the guidewire, and the wire was then used to pull the G tube down through the esophagus into the stomach. The tube was pulled through the abdominal wall until the bumper was flush with the gastric mucosa. This was at a length of 4.5cm at the skin, and the tube was able to spin easily at this position. The stomach was decompressed endoscopically and the scope was withdrawn. The esophageal mucosa was examined on withdrawal and was normal in appearance. The bumper of the G tube was secured to the skin with 2-0 Nylon suture. The umbilical port was removed and the abdomen was desufflated. The skin was closed with a 4-0 monocryl subcuticular suture and dermabond was applied.  The patient tolerated the procedure with no apparent complications. All counts were correct x2 at the end of the procedure. The patient was extubated and taken to PACU in stable condition.  Michaelle Birks, MD  10/23/20 1:52 PM

## 2020-10-23 NOTE — Transfer of Care (Signed)
Immediate Anesthesia Transfer of Care Note  Patient: Alan WILLETTS Sr.  Procedure(s) Performed: LAPAROSCOPIC ASSISTED PEG TUBE (N/A Abdomen)  Patient Location: PACU  Anesthesia Type:General  Level of Consciousness: drowsy and patient cooperative  Airway & Oxygen Therapy: Patient Spontanous Breathing and Patient connected to face mask oxygen  Post-op Assessment: Report given to RN and Post -op Vital signs reviewed and stable  Post vital signs: Reviewed and stable  Last Vitals:  Vitals Value Taken Time  BP 142/77 10/23/20 1318  Temp    Pulse 79 10/23/20 1320  Resp 11 10/23/20 1320  SpO2 99 % 10/23/20 1320  Vitals shown include unvalidated device data.  Last Pain:  Vitals:   10/23/20 0944  TempSrc: Oral  PainSc:          Complications: No complications documented.

## 2020-10-23 NOTE — Plan of Care (Signed)

## 2020-10-24 ENCOUNTER — Encounter: Payer: Self-pay | Admitting: General Practice

## 2020-10-24 ENCOUNTER — Ambulatory Visit: Payer: 59

## 2020-10-24 ENCOUNTER — Telehealth: Payer: Self-pay | Admitting: Internal Medicine

## 2020-10-24 ENCOUNTER — Other Ambulatory Visit: Payer: Self-pay

## 2020-10-24 ENCOUNTER — Encounter (HOSPITAL_COMMUNITY): Payer: Self-pay | Admitting: Surgery

## 2020-10-24 ENCOUNTER — Ambulatory Visit
Admission: RE | Admit: 2020-10-24 | Discharge: 2020-10-24 | Disposition: A | Discharge status: Home / Self Care | Payer: 59 | Source: Ambulatory Visit | Attending: Radiation Oncology | Admitting: Radiation Oncology

## 2020-10-24 ENCOUNTER — Ambulatory Visit: Payer: 59 | Admitting: Physical Therapy

## 2020-10-24 DIAGNOSIS — Z510 Encounter for antineoplastic radiation therapy: Secondary | ICD-10-CM | POA: Diagnosis not present

## 2020-10-24 DIAGNOSIS — C119 Malignant neoplasm of nasopharynx, unspecified: Secondary | ICD-10-CM | POA: Diagnosis not present

## 2020-10-24 MED ORDER — DOCUSATE SODIUM 100 MG PO CAPS: 100.0000 mg | ORAL_CAPSULE | Freq: Two times a day (BID) | ORAL | 0 refills | Status: DC

## 2020-10-24 MED ORDER — OXYCODONE HCL 5 MG PO TABS: 5.0000 mg | ORAL_TABLET | Freq: Four times a day (QID) | ORAL | 0 refills | Status: AC | PRN

## 2020-10-24 MED ORDER — ACETAMINOPHEN 500 MG PO TABS: 1000.0000 mg | ORAL_TABLET | Freq: Three times a day (TID) | ORAL | 0 refills | Status: DC | PRN

## 2020-10-24 NOTE — Discharge Instructions (Signed)
[facility] DISCHARGE INSTRUCTIONS  Activity . No heavy lifting greater than 10 pounds for 2 weeks after surgery. Madaline Brilliant to shower, but do not bathe or submerge incisions underwater. . Do not drive while taking narcotic pain medication.  Wound Care . Your incision is covered with skin glue called Dermabond. This will peel off on its own over time. . You may shower and allow warm soapy water to run over your incisions. Gently pat dry. . Do not submerge your incision or your G tube underwater. Keep the G tube site clean and dry. . Monitor your incision for any new redness, tenderness, or drainage. . Mild drainage around the G tube is common and normal. Please call if you notice increasing pain, severe redness, or foul-smelling drainage. Marland Kitchen Keep the G tube protected at all times to keep it from getting accidentally pulled out. If the tube falls out, please call the office right away at the number below.  When to Call us: Marland Kitchen Fever greater than 100.5 . New redness, drainage, or swelling at incision site . Severe pain, nausea, or vomiting  Follow-up You have an appointment scheduled with Dr. [name] on 11/11/20 at 10:30am. This will be at the [facility] Surgery office at [address], [place], [place], [place]. Please arrive at least 15 minutes prior to your scheduled appointment time.  For questions or concerns, please call the office at [phone].  [facility] Stay Proper nutrition can help your body recover from illness and injury.   Foods and beverages high in protein, vitamins, and minerals help rebuild muscle loss, promote healing, & reduce fall risk.   .In addition to eating healthy foods, a nutrition shake is an easy, delicious way to get the nutrition you need during and after your hospital stay  It is recommended that you continue to drink 1-2 bottles per day of: Ensure Plus as needed with your treatment   Tips for adding a nutrition shake into  your routine: As allowed, drink one with vitamins or medications instead of water or juice Enjoy one as a tasty mid-morning or afternoon snack Drink cold or make a milkshake out of it Drink one instead of milk with cereal or snacks Use as a coffee creamer   Available at the following grocery stores and pharmacies:           * [facility] [phone]            For COUPONS visit: www.ensure.com/join or http://dawson-may.com/   Suggested Substitutions Ensure Plus = Boost Plus = Carnation Breakfast Essentials = Boost Compact Ensure Active Clear = Boost Breeze Glucerna Shake = Boost Glucose Control = Carnation Breakfast Essentials SUGAR FREE  Suggestions For Increasing Calories And Protein . Several small meals a day are easier to eat and digest than three large ones. Space meals about 2 to 3 hours apart to maximize comfort. . Stop eating 2 to 3 hours before bed and sleep with your head elevated if gastric reflux (GERD) and heartburn are problems. . Do not eat your favorite foods if you are feeling bad. Save them for when you feel good! . Eat breakfast-type foods at any meal. Eggs are  usually easy to eat and are great any time of the day. (The same goes for pancakes and waffles.) . Eat when you feel hungry. Most people have the greatest appetite in the morning because they have not eaten all night. If this is the best meal for you, then pile on those calories and other nutrients in the morning and at lunch. Then you can have a smaller dinner without losing total calories for the day. . Eat leftovers or nutritious snacks in the afternoon and early evening to round out your day. . Try homemade or commercially prepared nutrition bars and puddings, as well as calorie- and protein-rich liquid nutritional supplements. Benefits of  Physical Activity Talk to your doctor about physical activity. Light or moderate physical activity can help maintain muscle and promote an appetite. Walking in the neighborhood or the local mall is a great way to get up, get out, and get moving. If you are unsteady on your feet, try walking around the dining room table. Save Room for [employer]! Drink most fluids between meals instead of with meals. (It is fine to have a sip to help swallow food at meal time.) Fluids (which usually have fewer calories and nutrients than solid food) can take up valuable space in your stomach.  Foods Recommended High-Protein Foods Milk products Add cheese to toast, crackers, sandwiches, baked potatoes, vegetables, soups, noodles, meat, and fruit. Use reduced-fat (2%) or whole milk in place of water when cooking cereal and cream soups. Include cream sauces on vegetables and pasta. Add powdered milk to cream soups and mashed potatoes.  Eggs Have hard-cooked eggs readily available in the refrigerator. Chop and add to salads, casseroles, soups, and vegetables. Make a quick egg salad. All eggs should be well cooked to avoid the risk of harmful bacteria.  Meats, poultry, and fish Add leftover cooked meats to soups, casseroles, salads, and omelets. Make dip by mixing diced, chopped, or shredded meat with sour cream and spices.  Beans, legumes, nuts, and seeds Sprinkle nuts and seeds on cereals, fruit, and desserts such as ice cream, pudding, and custard. Also serve nuts and seeds on vegetables, salads, and pasta. Spread peanut butter on toast, bread, English muffins, and fruit, or blend it in a milk shake. Add beans and peas to salads, soups, casseroles, and vegetable dishes.  High-Calorie Foods Butter, margarine, and  oils Melt butter or margarine over potatoes, rice, pasta, and cooked vegetables. Add melted butter or margarine into soups and casseroles and spread on bread for sandwiches before spreading sandwich spread  or peanut butter. Saut or stir-fry vegetables, meats, chicken and fish such as shrimp/scallops in olive or canola oil. A variety of oils add calories and can be used to [employer], chicken, or fish.  Milk products Add whipping cream to desserts, pancakes, waffles, fruit, and hot chocolate, and fold it into soups and casseroles. Add sour cream to baked potatoes and vegetables.  Salad dressing Use regular (not low-fat or diet) mayonnaise and salad dressing on sandwiches and in dips with vegetables and fruit.   Sweets Add jelly and honey to bread and crackers. Add jam to fruit and ice cream and as a topping over cake.   Copyright 2020  Academy of Nutrition and Dietetics. All rights reserved.

## 2020-10-24 NOTE — Progress Notes (Signed)
Oncology Nurse Navigator Documentation  To provide support, encouragement and care continuity, met with Alan Henry for his initial RT.    I reviewed the 2-step treatment process, answered questions.   Alan Henry completed treatment without difficulty, denied questions/concerns.  I reviewed the registration/arrival procedure for subsequent treatments.  He is feeling well after his PEG placement by surgery yesterday. I plan to meet with him tomorrow after his radiation treatment to reinforce previous PEG teaching.   I encouraged them to call me with questions/concerns as tmts proceed.   Harlow Asa RN, BSN, OCN Head & Neck Oncology Nurse [facility] Phone # [phone]  Fax # [phone]

## 2020-10-24 NOTE — Telephone Encounter (Signed)
   Alan Leech Sr. DOB: 16-Jan-1956 MRN: [id number]   RIDER WAIVER AND RELEASE OF LIABILITY  For purposes of improving physical access to our facilities, [facility] is pleased to partner with third parties to provide [facility] patients or other authorized individuals the option of convenient, on-demand ground transportation services (the [employer] bre[employer]") through use of the technology service that enables users to request on-demand ground transportation from independent third-party providers.  By opting to use and accept these [employer], I, the undersigned, hereby agree on behalf of myself, and on behalf of any minor child using the [employer] for whom I am the parent or legal guardian, as follows:  1. Government social research officer provided to me are provided by independent third-party transportation providers who are not [employer] or employees and who are unaffiliated with [employer]. 2. [facility] is neither a transportation carrier nor a common or public carrier. 3. [facility] has no control over the quality or safety of the transportation that occurs as a result of the [employer]. 4. [facility] cannot guarantee that any third-party transportation provider will complete any arranged transportation service. 5. [facility] makes no representation, warranty, or guarantee regarding the reliability, timeliness, quality, safety, suitability, or availability of any of the Transport Services or that they will be error free. 6. I fully understand that traveling by vehicle involves risks and dangers of serious bodily injury, including permanent disability, paralysis, and death. I agree, on behalf of myself and on behalf of any minor child using the Transport Services for whom I am the parent or legal guardian, that the entire risk arising out of my use of the [employer] remains solely with me, to the maximum extent permitted under applicable law. 7. The [employer] are provided "as is" and "as available." [facility] disclaims all representations and warranties, express, implied or statutory, not expressly set out in these terms, including the implied warranties of merchantability and fitness for a particular purpose. 8. I hereby waive and release [facility], its agents, employees, officers, directors, representatives, insurers, attorneys, assigns, successors, subsidiaries, and affiliates from any and all past, present, or future claims, demands, liabilities, actions, causes of action, or suits of any kind directly or indirectly arising from acceptance and use of the [employer]. 9. I further waive and release [facility] and its affiliates from all present and future liability and responsibility for any injury or death to persons or damages to property caused by or related to the use of the [employer]. 10. I have read this Waiver and Release of Liability, and I understand the terms used in it and their legal significance. This Waiver is freely and voluntarily given with the understanding that my right (as well as the right of any minor child for whom I am the parent or legal guardian using the [employer]) to legal recourse against [facility] in connection with the [employer] is knowingly surrendered in return for use of these services.   I attest that I read the consent document to Alan Leech Sr., gave Mr. Kerwood the opportunity to ask questions and answered the questions asked (if any). I affirm that Alan Leech Sr. then provided consent for he's participation in this program.     Legrand Pitts

## 2020-10-24 NOTE — Progress Notes (Signed)
Pharmacist Chemotherapy Monitoring - Initial Assessment    Anticipated start date: 10/31/20   Regimen:  . Are orders appropriate based on the patient's diagnosis, regimen, and cycle? Yes . Does the plan date match the patient's scheduled date? Yes . Is the sequencing of drugs appropriate? Yes . Are the premedications appropriate for the patient's regimen? Yes . Prior Authorization for treatment is: Approved o If applicable, is the correct biosimilar selected based on the patient's insurance? not applicable  Organ Function and Labs: Marland Kitchen Are dose adjustments needed based on the patient's renal function, hepatic function, or hematologic function? No . Are appropriate labs ordered prior to the start of patient's treatment? Yes . Other organ system assessment, if indicated: N/A . The following baseline labs, if indicated, have been ordered: N/A  Dose Assessment: . Are the drug doses appropriate? Yes . Are the following correct: o Drug concentrations Yes o IV fluid compatible with drug Yes o Administration routes Yes o Timing of therapy Yes . If applicable, does the patient have documented access for treatment and/or plans for port-a-cath placement? yes . If applicable, have lifetime cumulative doses been properly documented and assessed? yes Lifetime Dose Tracking  No doses have been documented on this patient for the following tracked chemicals: Doxorubicin, Epirubicin, Idarubicin, Daunorubicin, Mitoxantrone, Bleomycin, Oxaliplatin, Carboplatin, Liposomal Doxorubicin  o   Toxicity Monitoring/Prevention: . The patient has the following take home antiemetics prescribed: Ondansetron, Prochlorperazine, Dexamethasone and Lorazepam . The patient has the following take home medications prescribed: N/A . Medication allergies and previous infusion related reactions, if applicable, have been reviewed and addressed. Yes . The patient's current medication list has been assessed for drug-drug  interactions with their chemotherapy regimen. no significant drug-drug interactions were identified on review.  Order Review: . Are the treatment plan orders signed? No . Is the patient scheduled to see a provider prior to their treatment? No  I verify that I have reviewed each item in the above checklist and answered each question accordingly.   Kennith Center, Pharm.D., CPP 10/24/2020@2 :40 PM

## 2020-10-24 NOTE — Progress Notes (Addendum)
Initial Nutrition Assessment  DOCUMENTATION CODES:  Not applicable  INTERVENTION:  Continue current diet regimen.  Add Magic cup TID with meals, each supplement provides 290 kcal and 9 grams of protein.  NUTRITION DIAGNOSIS:  Increased nutrient needs related to cancer and cancer related treatments as evidenced by estimated needs.  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 Of note, pt does report difficulty swallow on occasion, but it does not hinder PO intake. 4/20 - PEG tube placement; started chemoradiation  Spoke with pt at bedside. Pt in good spirits. He reports feeling pretty well after his surgery yesterday. He was hesitant to eat at first, but he ate all of his breakfast once he realized it did not hurt going down. He reports having a great appetite at home also. Pt reports already drinking 1 Ensure Plus per day at home and hydrating with water. He also reports that he will be using Gatorade powder added to water to replete electrolytes if he loses too much fluid during treatment.  He reports a little weight loss, 5 lbs in the past few weeks. However, he has gained that back. His UBW is in 190s lbs. He reports his highest weight was last summer at 202 lbs. On exam, pt had very few mild depletions.  Encouraged good PO intake at home. Discussed changes in appetite, N/V, and likely use of his PEG when chemo begins in a couple of weeks. Encouraged him to talk to the outpt cancer center RD for questions.  Recommend adding Magic Cup TID given increased needs for cancer.  Medications: colace BID, Protonix Labs: reviewed; Glucose 126  NUTRITION - FOCUSED PHYSICAL  EXAM: Flowsheet Row Most Recent Value  Orbital Region No depletion  Upper Arm Region No depletion  Thoracic and Lumbar Region No depletion  Buccal Region Mild depletion  Temple Region Mild depletion  Clavicle Bone Region No depletion  Clavicle and Acromion Bone Region No depletion  Scapular Bone Region Unable to assess  Dorsal Hand No depletion  Patellar Region No depletion  Anterior Thigh Region No depletion  Posterior Calf Region No depletion  Edema (RD Assessment) None  Hair Reviewed  Eyes Reviewed  Mouth Reviewed  Skin Reviewed  Nails Reviewed     Diet Order:   Diet Order            DIET SOFT Room service appropriate? Yes; Fluid consistency: Thin  Diet effective now           Diet - low sodium heart healthy                EDUCATION NEEDS:  Education needs have been addressed  Skin:  Skin Assessment: Reviewed RN Assessment  Last BM:  10/22/20  Height:  Ht Readings from Last 1 Encounters:  10/22/20 5' 9.5" (1.765 m)   Weight:  Wt Readings from Last 1 Encounters:  10/22/20 84.6 kg   Ideal Body Weight:  74.1 kg  BMI:  Body mass index is 27.15 kg/m.  Estimated Nutritional Needs:  Kcal:  2300-2500 Protein:  105-120 grams Fluid:  >2 L  Derrel Nip, RD, LDN Registered Dietitian After Hours/Weekend Pager # in [place]

## 2020-10-24 NOTE — Progress Notes (Signed)
[place] CSW Progress Notes  Called patient at request of [facility]. RN.  Patient interested in getting more help at home.  Says that wife is disabled post stroke and he used to take care of her - now he feels he himself will need help.  He does not have Medicaid and he would need to pay privately for sitter/homemaker type in home services.  Wife does have Medicaid - advised him to work with her providers to see if she can apply for [place] to provide more help to her in the home.  Edwyna Shell, LCSW Clinical Social Worker Phone:  [phone]

## 2020-10-25 ENCOUNTER — Ambulatory Visit
Admission: RE | Admit: 2020-10-25 | Discharge: 2020-10-25 | Disposition: A | Discharge status: Home / Self Care | Payer: 59 | Source: Ambulatory Visit | Attending: Radiation Oncology | Admitting: Radiation Oncology

## 2020-10-25 DIAGNOSIS — Z510 Encounter for antineoplastic radiation therapy: Secondary | ICD-10-CM | POA: Diagnosis not present

## 2020-10-25 NOTE — Progress Notes (Signed)
Oncology Nurse Navigator Documentation  Met with Mr. Minner and his wife Carlyon Shadow to provide PEG education after surgical placement on 10/22/20.   . Using  PEG teaching device   and Teach Back, provided education for PEG use and care, including: hand hygiene, gravity bolus administration of daily water flushes and nutritional supplement, fluids and medications; care of tube insertion site including daily dressing change and cleaning; S&S of infection.   . Mr. Lindaman correctly verbalized procedures for and provided correct return demonstration of gravity administration of water, dressing change and site care with his PEG that was placed 10/24/20. . I provided written instructions for PEG flushing/dressing change in support of verbal instruction.   . I provided/described contents of Start of Care Bolus Feeding Kit (3 60 cc syringes, 2 boxes 4x4 drainage sponges, 1 package mesh briefs, 1 roll paper tape, 1 case Osmolite 1.5).  He voiced understanding he is to start using Osmolite per guidance of Nutrition. Marland Kitchen He understands I will be available for ongoing PEG support.  Harlow Asa RN, BSN, OCN Head & Neck Oncology Nurse [place] at [facility] Phone # [phone]  Fax # [phone]

## 2020-10-28 ENCOUNTER — Other Ambulatory Visit: Payer: Self-pay | Admitting: Radiation Oncology

## 2020-10-28 ENCOUNTER — Ambulatory Visit
Admission: RE | Admit: 2020-10-28 | Discharge: 2020-10-28 | Disposition: A | Discharge status: Home / Self Care | Payer: 59 | Source: Ambulatory Visit | Attending: Radiation Oncology | Admitting: Radiation Oncology

## 2020-10-28 ENCOUNTER — Other Ambulatory Visit: Payer: Self-pay

## 2020-10-28 DIAGNOSIS — C113 Malignant neoplasm of anterior wall of nasopharynx: Secondary | ICD-10-CM

## 2020-10-28 DIAGNOSIS — Z510 Encounter for antineoplastic radiation therapy: Secondary | ICD-10-CM | POA: Diagnosis not present

## 2020-10-28 DIAGNOSIS — C119 Malignant neoplasm of nasopharynx, unspecified: Secondary | ICD-10-CM

## 2020-10-28 MED ORDER — SONAFINE EX EMUL
1.0000 "application " | Freq: Two times a day (BID) | CUTANEOUS | Status: DC
  Administered 2020-10-28: 1 via TOPICAL

## 2020-10-28 MED ORDER — LIDOCAINE VISCOUS HCL 2 % MT SOLN: OROMUCOSAL | 3 refills | Status: DC

## 2020-10-28 NOTE — Progress Notes (Signed)

## 2020-10-29 ENCOUNTER — Ambulatory Visit
Admission: RE | Admit: 2020-10-29 | Discharge: 2020-10-29 | Disposition: A | Discharge status: Home / Self Care | Payer: 59 | Source: Ambulatory Visit | Attending: Radiation Oncology | Admitting: Radiation Oncology

## 2020-10-29 DIAGNOSIS — Z510 Encounter for antineoplastic radiation therapy: Secondary | ICD-10-CM | POA: Diagnosis not present

## 2020-10-30 ENCOUNTER — Other Ambulatory Visit: Payer: Self-pay

## 2020-10-30 ENCOUNTER — Ambulatory Visit
Admission: RE | Admit: 2020-10-30 | Discharge: 2020-10-30 | Disposition: A | Discharge status: Home / Self Care | Payer: 59 | Source: Ambulatory Visit | Attending: Radiation Oncology | Admitting: Radiation Oncology

## 2020-10-30 DIAGNOSIS — Z510 Encounter for antineoplastic radiation therapy: Secondary | ICD-10-CM | POA: Diagnosis not present

## 2020-10-30 NOTE — Discharge Summary (Signed)
Physician Discharge Summary  Patient ID: Alan Leech Sr. MRN: [id number] DOB/AGE: 63/09/1955 63 y.o.  Admit date: 10/23/2020 Discharge date: 10/24/2020 Admission Diagnoses:  Discharge Diagnoses:  Principal Problem:   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[place]) Active Problems:   Nasopharyngeal cancer [facility])   Discharged Condition: stable  Hospital Course: Alan Henry is a 65 yo male who was recently diagnosed with a nasopharyngeal cancer and was referred for placement of a G tube prior to starting radiation treatment. He was taken to the OR on 10/23/20 for a laparoscopic-assisted PEG tube placement. Please see separately dictated operative note for further details of the procedure. Postoperatively he was admitted for overnight observation on the med surg floor. G tube was left to gravity drainage overnight and capped on POD1. He was advanced to a soft diet on the morning of POD1, which he tolerated. Pain was controlled. He was examined the morning of POD1 and deemed appropriate for discharge home.   Consults: None  Significant Diagnostic Studies: N/A  Treatments: surgery: laparoscopic-assisted percutaneous endoscopic gastrostomy tube  Discharge Exam: Blood pressure 135/74, pulse 65, temperature 98.1 F (36.7 C), temperature source Oral, resp. rate 15, height 5' 9.5" (1.765 m), weight 84.6 kg, SpO2 98 %. General appearance: alert, cooperative and appears stated age Neck: significant cervical lymphadenopathy Resp: normal work of breathing GI: soft, nontender, nondistended Skin: Skin color, texture, turgor normal. No rashes or lesions Neurologic: Grossly normal Incision/Wound: umbilical incision clean and dry with no induration or drainage. G tube in place LUQ.  Disposition: Discharge disposition: 01-Home or Self Care       Discharge Instructions    Call MD for:  persistant nausea and vomiting   Complete by: As directed    Call MD for:  redness, tenderness, or signs of infection  (pain, swelling, redness, odor or Vannatter/yellow discharge around incision site)   Complete by: As directed    Call MD for:  severe uncontrolled pain   Complete by: As directed    Call MD for:  temperature >100.4   Complete by: As directed    Diet - low sodium heart healthy   Complete by: As directed    Increase activity slowly   Complete by: As directed    Lifting restrictions   Complete by: As directed    10 pounds for 2 weeks.     Allergies as of 10/24/2020   No Known Allergies     Medication List    TAKE these medications   acetaminophen 500 MG tablet Commonly known as: TYLENOL Take 2 tablets (1,000 mg total) by mouth every 8 (eight) hours as needed for mild pain.   amLODipine 10 MG tablet Commonly known as: NORVASC Take 10 mg by mouth daily.   atorvastatin 80 MG tablet Commonly known as: LIPITOR Take 80 mg by mouth daily.   cetirizine 10 MG tablet Commonly known as: ZYRTEC Take 10 mg by mouth daily.   dexamethasone 4 MG tablet Commonly known as: DECADRON Take 2 tablets (8 mg total) by mouth daily. Take daily x 3 days starting the day after cisplatin chemotherapy. Take with food.   docusate sodium 100 MG capsule Commonly known as: COLACE Take 1 capsule (100 mg total) by mouth 2 (two) times daily.   ergocalciferol 1.25 MG (50000 UT) capsule Commonly known as: VITAMIN D2 Take 50,000 Units by mouth once a week. Wednesday   lidocaine-prilocaine cream Commonly known as: EMLA Apply to affected area once   LORazepam 0.5 MG tablet Commonly known  as: Ativan Take 1 tablet (0.5 mg total) by mouth every 6 (six) hours as needed (Nausea or vomiting).   nitroGLYCERIN 0.4 MG SL tablet Commonly known as: NITROSTAT Place 0.4 mg under the tongue every 5 (five) minutes x 3 doses as needed for chest pain.   omeprazole 20 MG capsule Commonly known as: PRILOSEC Take 20 mg by mouth daily.   ondansetron 8 MG tablet Commonly known as: Zofran Take 1 tablet (8 mg total) by  mouth 2 (two) times daily as needed. Start on the third day after cisplatin chemotherapy.   prochlorperazine 10 MG tablet Commonly known as: COMPAZINE Take 1 tablet (10 mg total) by mouth every 6 (six) hours as needed (Nausea or vomiting).   sildenafil 50 MG tablet Commonly known as: VIAGRA Take 50-100 mg by mouth daily as needed for erectile dysfunction.     ASK your doctor about these medications   oxyCODONE 5 MG immediate release tablet Commonly known as: Oxy IR/ROXICODONE Take 1 tablet (5 mg total) by mouth every 6 (six) hours as needed for up to 3 days for severe pain. Ask about: Should I take this medication?        Signed: Dwan Bolt 10/30/2020, 1:54 PM

## 2020-10-31 ENCOUNTER — Other Ambulatory Visit: Payer: 59

## 2020-10-31 ENCOUNTER — Ambulatory Visit
Admission: RE | Admit: 2020-10-31 | Discharge: 2020-10-31 | Disposition: A | Discharge status: Home / Self Care | Payer: 59 | Source: Ambulatory Visit | Attending: Radiation Oncology | Admitting: Radiation Oncology

## 2020-10-31 ENCOUNTER — Inpatient Hospital Stay: Payer: 59

## 2020-10-31 ENCOUNTER — Other Ambulatory Visit: Payer: Self-pay | Admitting: Hematology and Oncology

## 2020-10-31 ENCOUNTER — Inpatient Hospital Stay: Payer: 59 | Admitting: Nutrition

## 2020-10-31 VITALS — BP 115/74 | HR 78 | Temp 98.6°F | Resp 16 | Wt 182.5 lb

## 2020-10-31 DIAGNOSIS — C113 Malignant neoplasm of anterior wall of nasopharynx: Secondary | ICD-10-CM

## 2020-10-31 DIAGNOSIS — Z95828 Presence of other vascular implants and grafts: Secondary | ICD-10-CM

## 2020-10-31 DIAGNOSIS — Z5111 Encounter for antineoplastic chemotherapy: Secondary | ICD-10-CM | POA: Diagnosis not present

## 2020-10-31 DIAGNOSIS — C119 Malignant neoplasm of nasopharynx, unspecified: Secondary | ICD-10-CM

## 2020-10-31 DIAGNOSIS — Z510 Encounter for antineoplastic radiation therapy: Secondary | ICD-10-CM | POA: Diagnosis not present

## 2020-10-31 LAB — CMP (CANCER CENTER ONLY)
ALT: 44 U/L (ref 0–44)
AST: 25 U/L (ref 15–41)
Albumin: 3.8 g/dL (ref 3.5–5.0)
Alkaline Phosphatase: 95 U/L (ref 38–126)
Anion gap: 11 (ref 5–15)
BUN: 23 mg/dL (ref 8–23)
CO2: 28 mmol/L (ref 22–32)
Calcium: 9.4 mg/dL (ref 8.9–10.3)
Chloride: 101 mmol/L (ref 98–111)
Creatinine: 1.22 mg/dL (ref 0.61–1.24)
GFR, Estimated: >60 mL/min (ref >60)
Glucose, Bld: 149 mg/dL — ABNORMAL HIGH (ref 70–99)
Potassium: 3.9 mmol/L (ref 3.5–5.1)
Sodium: 140 mmol/L (ref 135–145)
Total Bilirubin: 0.5 mg/dL (ref 0.3–1.2)
Total Protein: 8.6 g/dL — ABNORMAL HIGH (ref 6.5–8.1)

## 2020-10-31 LAB — CBC WITH DIFFERENTIAL (CANCER CENTER ONLY)
Abs Immature Granulocytes: 0.03 10*3/uL (ref 0.00–0.07)
Basophils Absolute: 0 10*3/uL (ref 0.0–0.1)
Basophils Relative: 0 %
Eosinophils Absolute: 0.1 10*3/uL (ref 0.0–0.5)
Eosinophils Relative: 1 %
HCT: 41.1 % (ref 39.0–52.0)
Hemoglobin: 13.2 g/dL (ref 13.0–17.0)
Immature Granulocytes: 1 %
Lymphocytes Relative: 6 %
Lymphs Abs: 0.4 10*3/uL — ABNORMAL LOW (ref 0.7–4.0)
MCH: 28.8 pg (ref 26.0–34.0)
MCHC: 32.1 g/dL (ref 30.0–36.0)
MCV: 89.7 fL (ref 80.0–100.0)
Monocytes Absolute: 0.4 10*3/uL (ref 0.1–1.0)
Monocytes Relative: 6 %
Neutro Abs: 5.6 10*3/uL (ref 1.7–7.7)
Neutrophils Relative %: 86 %
Platelet Count: 247 10*3/uL (ref 150–400)
RBC: 4.58 MIL/uL (ref 4.22–5.81)
RDW: 14.6 % (ref 11.5–15.5)
WBC Count: 6.5 10*3/uL (ref 4.0–10.5)
nRBC: 0 % (ref 0.0–0.2)

## 2020-10-31 LAB — MAGNESIUM: Magnesium: 2 mg/dL (ref 1.7–2.4)

## 2020-10-31 MED ORDER — SODIUM CHLORIDE 0.9% FLUSH
10.0000 mL | INTRAVENOUS | Status: DC | PRN
  Administered 2020-10-31: 10 mL
  Filled 2020-10-31: qty 10

## 2020-10-31 MED ORDER — FOSAPREPITANT DIMEGLUMINE INJECTION 150 MG
150.0000 mg | Freq: Once | INTRAVENOUS | Status: AC
  Administered 2020-10-31: 150 mg via INTRAVENOUS
  Filled 2020-10-31: qty 150

## 2020-10-31 MED ORDER — PALONOSETRON HCL INJECTION 0.25 MG/5ML
INTRAVENOUS | Status: AC
  Filled 2020-10-31: qty 5

## 2020-10-31 MED ORDER — MAGNESIUM SULFATE 2 GM/50ML IV SOLN
2.0000 g | Freq: Once | INTRAVENOUS | Status: AC
  Administered 2020-10-31: 2 g via INTRAVENOUS

## 2020-10-31 MED ORDER — HEPARIN SOD (PORK) LOCK FLUSH 100 UNIT/ML IV SOLN
500.0000 [IU] | Freq: Once | INTRAVENOUS | Status: AC | PRN
  Administered 2020-10-31: 500 [IU]
  Filled 2020-10-31: qty 5

## 2020-10-31 MED ORDER — SODIUM CHLORIDE 0.9% FLUSH
10.0000 mL | Freq: Once | INTRAVENOUS | Status: AC
  Administered 2020-10-31: 10 mL
  Filled 2020-10-31: qty 10

## 2020-10-31 MED ORDER — MAGNESIUM SULFATE 2 GM/50ML IV SOLN
INTRAVENOUS | Status: AC
  Filled 2020-10-31: qty 50

## 2020-10-31 MED ORDER — POTASSIUM CHLORIDE IN NACL 20-0.9 MEQ/L-% IV SOLN
Freq: Once | INTRAVENOUS | Status: AC
  Filled 2020-10-31: qty 1000

## 2020-10-31 MED ORDER — DEXAMETHASONE SODIUM PHOSPHATE 100 MG/10ML IJ SOLN
10.0000 mg | Freq: Once | INTRAMUSCULAR | Status: AC
  Administered 2020-10-31: 10 mg via INTRAVENOUS
  Filled 2020-10-31: qty 10

## 2020-10-31 MED ORDER — SODIUM CHLORIDE 0.9 % IV SOLN
Freq: Once | INTRAVENOUS | Status: AC
  Filled 2020-10-31: qty 250

## 2020-10-31 MED ORDER — PALONOSETRON HCL INJECTION 0.25 MG/5ML
0.2500 mg | Freq: Once | INTRAVENOUS | Status: AC
  Administered 2020-10-31: 0.25 mg via INTRAVENOUS

## 2020-10-31 MED ORDER — SODIUM CHLORIDE 0.9 % IV SOLN
40.0000 mg/m2 | Freq: Once | INTRAVENOUS | Status: AC
  Administered 2020-10-31: 81 mg via INTRAVENOUS
  Filled 2020-10-31: qty 81

## 2020-10-31 NOTE — Patient Instructions (Addendum)
[facility] ONCOLOGY  Discharge Instructions: Thank you for choosing [facility] to provide your oncology and hematology care.   If you have a lab appointment with the [facility], please go directly to the [facility] and check in at the registration area.   Wear comfortable clothing and clothing appropriate for easy access to any Portacath or PICC line.   We strive to give you quality time with your provider. You may need to reschedule your appointment if you arrive late (15 or more minutes).  Arriving late affects you and other patients whose appointments are after yours.  Also, if you miss three or more appointments without notifying the office, you may be dismissed from the clinic at the provider's discretion.      For prescription refill requests, have your pharmacy contact our office and allow 72 hours for refills to be completed.    Today you received the following chemotherapy and/or immunotherapy agent: Cisplatin   To help prevent nausea and vomiting after your treatment, we encourage you to take your nausea medication as directed.  BELOW ARE SYMPTOMS THAT SHOULD BE REPORTED IMMEDIATELY: . *FEVER GREATER THAN 100.4 F (38 C) OR HIGHER . *CHILLS OR SWEATING . *NAUSEA AND VOMITING THAT IS NOT CONTROLLED WITH YOUR NAUSEA MEDICATION . *UNUSUAL SHORTNESS OF BREATH . *UNUSUAL BRUISING OR BLEEDING . *URINARY PROBLEMS (pain or burning when urinating, or frequent urination) . *BOWEL PROBLEMS (unusual diarrhea, constipation, pain near the anus) . TENDERNESS IN MOUTH AND THROAT WITH OR WITHOUT PRESENCE OF ULCERS (sore throat, sores in mouth, or a toothache) . UNUSUAL RASH, SWELLING OR PAIN  . UNUSUAL VAGINAL DISCHARGE OR ITCHING   Items with * indicate a potential emergency and should be followed up as soon as possible or go to the Emergency Department if any problems should occur.  Please show the CHEMOTHERAPY ALERT CARD or IMMUNOTHERAPY ALERT CARD  at check-in to the Emergency Department and triage nurse.  Should you have questions after your visit or need to cancel or reschedule your appointment, please contact [facility]  Dept: [phone]  and follow the prompts.  Office hours are 8:00 a.m. to 4:30 p.m. Monday - Friday. Please note that voicemails left after 4:00 p.m. may not be returned until the following business day.  We are closed weekends and major holidays. You have access to a nurse at all times for urgent questions. Please call the main number to the clinic Dept: [phone] and follow the prompts.   For any non-urgent questions, you may also contact your provider using MyChart. We now offer e-Visits for anyone 29 and older to request care online for non-urgent symptoms. For details visit [email or web address].   Also download the MyChart app! Go to the app store, search "MyChart", open the app, select [facility], and log in with your MyChart username and password.  Due to Covid, a mask is required upon entering the hospital/clinic. If you do not have a mask, one will be given to you upon arrival. For doctor visits, patients may have 1 support person aged 30 or older with them. For treatment visits, patients cannot have anyone with them due to current Covid guidelines and our immunocompromised population.   Cisplatin injection What is this medicine? CISPLATIN (SIS pla tin) is a chemotherapy drug. It targets fast dividing cells, like cancer cells, and causes these cells to die. This medicine is used to treat many types of cancer like bladder, ovarian, and  testicular cancers. This medicine may be used for other purposes; ask your health care provider or pharmacist if you have questions. COMMON BRAND NAME(S): Platinol, Platinol -AQ What should I tell my health care provider before I take this medicine? They need to know if you have any of these conditions:  eye disease, vision problems  hearing  problems  kidney disease  low blood counts, like white cells, platelets, or red blood cells  tingling of the fingers or toes, or other nerve disorder  an unusual or allergic reaction to cisplatin, carboplatin, oxaliplatin, other medicines, foods, dyes, or preservatives  pregnant or trying to get pregnant  breast-feeding How should I use this medicine? This drug is given as an infusion into a vein. It is administered in a hospital or clinic by a specially trained health care professional. Talk to your pediatrician regarding the use of this medicine in children. Special care may be needed. Overdosage: If you think you have taken too much of this medicine contact a poison control center or emergency room at once. NOTE: This medicine is only for you. Do not share this medicine with others. What if I miss a dose? It is important not to miss a dose. Call your doctor or health care professional if you are unable to keep an appointment. What may interact with this medicine? This medicine may interact with the following medications:  foscarnet  certain antibiotics like amikacin, gentamicin, neomycin, polymyxin B, streptomycin, tobramycin, vancomycin This list may not describe all possible interactions. Give your health care provider a list of all the medicines, herbs, non-prescription drugs, or dietary supplements you use. Also tell them if you smoke, drink alcohol, or use illegal drugs. Some items may interact with your medicine. What should I watch for while using this medicine? Your condition will be monitored carefully while you are receiving this medicine. You will need important blood work done while you are taking this medicine. This drug may make you feel generally unwell. This is not uncommon, as chemotherapy can affect healthy cells as well as cancer cells. Report any side effects. Continue your course of treatment even though you feel ill unless your doctor tells you to stop. This  medicine may increase your risk of getting an infection. Call your healthcare professional for advice if you get a fever, chills, or sore throat, or other symptoms of a cold or flu. Do not treat yourself. Try to avoid being around people who are sick. Avoid taking medicines that contain aspirin, acetaminophen, ibuprofen, naproxen, or ketoprofen unless instructed by your healthcare professional. These medicines may hide a fever. This medicine may increase your risk to bruise or bleed. Call your doctor or health care professional if you notice any unusual bleeding. Be careful brushing and flossing your teeth or using a toothpick because you may get an infection or bleed more easily. If you have any dental work done, tell your dentist you are receiving this medicine. Do not become pregnant while taking this medicine or for 14 months after stopping it. Women should inform their healthcare professional if they wish to become pregnant or think they might be pregnant. Men should not father a child while taking this medicine and for 11 months after stopping it. There is potential for serious side effects to an unborn child. Talk to your healthcare professional for more information. Do not breast-feed an infant while taking this medicine. This medicine has caused ovarian failure in some women. This medicine may make it more difficult to  get pregnant. Talk to your healthcare professional if you are concerned about your fertility. This medicine has caused decreased sperm counts in some men. This may make it more difficult to father a child. Talk to your healthcare professional if you are concerned about your fertility. Drink fluids as directed while you are taking this medicine. This will help protect your kidneys. Call your doctor or health care professional if you get diarrhea. Do not treat yourself. What side effects may I notice from receiving this medicine? Side effects that you should report to your doctor or  health care professional as soon as possible:  allergic reactions like skin rash, itching or hives, swelling of the face, lips, or tongue  blurred vision  changes in vision  decreased hearing or ringing of the ears  nausea, vomiting  pain, redness, or irritation at site where injected  pain, tingling, numbness in the hands or feet  signs and symptoms of bleeding such as bloody or black, tarry stools; red or dark brown urine; spitting up blood or brown material that looks like coffee grounds; red spots on the skin; unusual bruising or bleeding from the eyes, gums, or nose  signs and symptoms of infection like fever; chills; cough; sore throat; pain or trouble passing urine  signs and symptoms of kidney injury like trouble passing urine or change in the amount of urine  signs and symptoms of low red blood cells or anemia such as unusually weak or tired; feeling faint or lightheaded; falls; breathing problems Side effects that usually do not require medical attention (report to your doctor or health care professional if they continue or are bothersome):  loss of appetite  mouth sores  muscle cramps This list may not describe all possible side effects. Call your doctor for medical advice about side effects. You may report side effects to FDA at 1-800-FDA-1088. Where should I keep my medicine? This drug is given in a hospital or clinic and will not be stored at home. NOTE: This sheet is a summary. It may not cover all possible information. If you have questions about this medicine, talk to your doctor, pharmacist, or health care provider.  2021 Elsevier/Gold Standard (2018-06-17 15:59:17)   

## 2020-10-31 NOTE — Progress Notes (Signed)
Nutrition follow-up completed with patient during first infusion for cisplatin secondary to nasopharyngeal cancer of the soft palate.  He is also receiving concurrent radiation therapy.  Patient is status post surgical G-tube on April 20.  This was placed surgically second to patient anatomy. Patient reports he feels well. He feels like he is eating better.  He is drinking 1 Ensure Plus a day and is trying to add variety. He is hydrating well with water. Weight decreased and documented as 182.5 pounds April 28 decreased from 186.6 pounds April 12. Patient is flushing his feeding tube daily without difficulty. He is cleaning around the site daily.  Nutrition diagnosis: Food and nutrition related knowledge deficit continues.  Intervention: Patient educated to continue strategies for adequate calorie and protein intake by mouth. Encouraged him to continue flushing feeding tube with water daily. Brief education provided on G-tube. Enforced importance of weight maintenance/minimal weight loss.  Monitoring, evaluation, goals: Patient will tolerate adequate calories and protein to minimize weight loss.  Next visit: Thursday, May 5 during infusion.  **Disclaimer: This note was dictated with voice recognition software. Similar sounding words can inadvertently be transcribed and this note may contain transcription errors which may not have been corrected upon publication of note.**

## 2020-11-01 ENCOUNTER — Other Ambulatory Visit: Payer: Self-pay

## 2020-11-01 ENCOUNTER — Ambulatory Visit
Admission: RE | Admit: 2020-11-01 | Discharge: 2020-11-01 | Disposition: A | Discharge status: Home / Self Care | Payer: 59 | Source: Ambulatory Visit | Attending: Radiation Oncology | Admitting: Radiation Oncology

## 2020-11-01 DIAGNOSIS — Z510 Encounter for antineoplastic radiation therapy: Secondary | ICD-10-CM | POA: Diagnosis not present

## 2020-11-04 ENCOUNTER — Ambulatory Visit
Admission: RE | Admit: 2020-11-04 | Discharge: 2020-11-04 | Disposition: A | Discharge status: Home / Self Care | Payer: 59 | Source: Ambulatory Visit | Attending: Radiation Oncology | Admitting: Radiation Oncology

## 2020-11-04 DIAGNOSIS — R634 Abnormal weight loss: Secondary | ICD-10-CM | POA: Insufficient documentation

## 2020-11-04 DIAGNOSIS — Z95828 Presence of other vascular implants and grafts: Secondary | ICD-10-CM | POA: Diagnosis present

## 2020-11-04 DIAGNOSIS — C119 Malignant neoplasm of nasopharynx, unspecified: Secondary | ICD-10-CM | POA: Diagnosis present

## 2020-11-05 ENCOUNTER — Ambulatory Visit
Admission: RE | Admit: 2020-11-05 | Discharge: 2020-11-05 | Disposition: A | Discharge status: Home / Self Care | Payer: 59 | Source: Ambulatory Visit | Attending: Radiation Oncology | Admitting: Radiation Oncology

## 2020-11-05 ENCOUNTER — Other Ambulatory Visit: Payer: Self-pay

## 2020-11-05 DIAGNOSIS — R634 Abnormal weight loss: Secondary | ICD-10-CM | POA: Diagnosis not present

## 2020-11-06 ENCOUNTER — Inpatient Hospital Stay: Payer: 59 | Attending: Hematology and Oncology

## 2020-11-06 ENCOUNTER — Other Ambulatory Visit: Payer: 59

## 2020-11-06 ENCOUNTER — Encounter: Payer: Self-pay | Admitting: Hematology and Oncology

## 2020-11-06 ENCOUNTER — Inpatient Hospital Stay: Payer: 59 | Admitting: Hematology and Oncology

## 2020-11-06 ENCOUNTER — Ambulatory Visit
Admission: RE | Admit: 2020-11-06 | Discharge: 2020-11-06 | Disposition: A | Discharge status: Home / Self Care | Payer: 59 | Source: Ambulatory Visit | Attending: Radiation Oncology | Admitting: Radiation Oncology

## 2020-11-06 DIAGNOSIS — R634 Abnormal weight loss: Secondary | ICD-10-CM | POA: Diagnosis not present

## 2020-11-06 DIAGNOSIS — K5900 Constipation, unspecified: Secondary | ICD-10-CM | POA: Insufficient documentation

## 2020-11-06 DIAGNOSIS — T451X5A Adverse effect of antineoplastic and immunosuppressive drugs, initial encounter: Secondary | ICD-10-CM | POA: Insufficient documentation

## 2020-11-06 DIAGNOSIS — R112 Nausea with vomiting, unspecified: Secondary | ICD-10-CM | POA: Diagnosis not present

## 2020-11-06 DIAGNOSIS — C113 Malignant neoplasm of anterior wall of nasopharynx: Secondary | ICD-10-CM | POA: Insufficient documentation

## 2020-11-06 DIAGNOSIS — R5383 Other fatigue: Secondary | ICD-10-CM | POA: Insufficient documentation

## 2020-11-06 DIAGNOSIS — K1231 Oral mucositis (ulcerative) due to antineoplastic therapy: Secondary | ICD-10-CM | POA: Insufficient documentation

## 2020-11-06 DIAGNOSIS — Z5111 Encounter for antineoplastic chemotherapy: Secondary | ICD-10-CM | POA: Insufficient documentation

## 2020-11-06 DIAGNOSIS — K5903 Drug induced constipation: Secondary | ICD-10-CM

## 2020-11-06 DIAGNOSIS — C119 Malignant neoplasm of nasopharynx, unspecified: Secondary | ICD-10-CM

## 2020-11-06 LAB — CBC WITH DIFFERENTIAL (CANCER CENTER ONLY)
Abs Immature Granulocytes: 0.02 10*3/uL (ref 0.00–0.07)
Basophils Absolute: 0 10*3/uL (ref 0.0–0.1)
Basophils Relative: 0 %
Eosinophils Absolute: 0 10*3/uL (ref 0.0–0.5)
Eosinophils Relative: 1 %
HCT: 42.5 % (ref 39.0–52.0)
Hemoglobin: 13.9 g/dL (ref 13.0–17.0)
Immature Granulocytes: 0 %
Lymphocytes Relative: 5 %
Lymphs Abs: 0.3 10*3/uL — ABNORMAL LOW (ref 0.7–4.0)
MCH: 29 pg (ref 26.0–34.0)
MCHC: 32.7 g/dL (ref 30.0–36.0)
MCV: 88.5 fL (ref 80.0–100.0)
Monocytes Absolute: 0.4 10*3/uL (ref 0.1–1.0)
Monocytes Relative: 9 %
Neutro Abs: 4.3 10*3/uL (ref 1.7–7.7)
Neutrophils Relative %: 85 %
Platelet Count: 257 10*3/uL (ref 150–400)
RBC: 4.8 MIL/uL (ref 4.22–5.81)
RDW: 14.6 % (ref 11.5–15.5)
WBC Count: 5.1 10*3/uL (ref 4.0–10.5)
nRBC: 0 % (ref 0.0–0.2)

## 2020-11-06 LAB — CMP (CANCER CENTER ONLY)
ALT: 36 U/L (ref 0–44)
AST: 16 U/L (ref 15–41)
Albumin: 3.5 g/dL (ref 3.5–5.0)
Alkaline Phosphatase: 88 U/L (ref 38–126)
Anion gap: 10 (ref 5–15)
BUN: 19 mg/dL (ref 8–23)
CO2: 30 mmol/L (ref 22–32)
Calcium: 9.4 mg/dL (ref 8.9–10.3)
Chloride: 98 mmol/L (ref 98–111)
Creatinine: 1.23 mg/dL (ref 0.61–1.24)
GFR, Estimated: >60 mL/min (ref >60)
Glucose, Bld: 105 mg/dL — ABNORMAL HIGH (ref 70–99)
Potassium: 3.8 mmol/L (ref 3.5–5.1)
Sodium: 138 mmol/L (ref 135–145)
Total Bilirubin: 0.5 mg/dL (ref 0.3–1.2)
Total Protein: 7.9 g/dL (ref 6.5–8.1)

## 2020-11-06 LAB — MAGNESIUM: Magnesium: 1.8 mg/dL (ref 1.7–2.4)

## 2020-11-06 NOTE — Assessment & Plan Note (Addendum)
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 Since he is EBV negative, there is no clear role of induction. We discussed role of concurrent chemoradiation followed by adjuvant chemotherapy. He was very anxious about bolus cisplatin dosing. He is now on weekly cisplatin, skipped planned dose of cisplatin first week because of conflicting appointments despite offer to reschedule non chemo appointments to another date. He started C1D1 on 10/31/2020.  ROS, severe fatigue, mild nausea and constiption. Encouraged using G tube for nutrition,I think most of his fatigue is because of poor intake besides ongoing treatment. We discussed about dose reduction but patient would like to try one more treatment with full dose. Lab reviewed, satisfactory to proceed.

## 2020-11-06 NOTE — Assessment & Plan Note (Signed)
From poor po intake Encouraged use of G tube. Will meet our nutritionist tomorrow.

## 2020-11-06 NOTE — Assessment & Plan Note (Signed)
Ok to use dulcolax PRN

## 2020-11-06 NOTE — Progress Notes (Signed)
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 Since he is EBV negative, there is no clear role of induction. We discussed role of concurrent chemoradiation followed by adjuvant chemotherapy. He was very anxious about bolus cisplatin dosing. He is now on weekly cisplatin, skipped planned dose of cisplatin first week because of conflicting appointments despite offer to reschedule non chemo appointments to another date. He started C1D1 on 10/31/2020.  ROS, severe fatigue, mild nausea and constiption. Encouraged using G tube for nutrition,I think most of his fatigue is because of poor intake besides ongoing treatment. We discussed about dose reduction but patient would like to try one more treatment with full dose. Lab reviewed, satisfactory to proceed.    Chemotherapy induced nausea and vomiting Nausea mostly without vomiting Use prn Zofran vs compazine.  Weight loss, unintentional From poor po intake Encouraged use of G tube. Will meet our nutritionist tomorrow.  Constipation Ok to use dulcolax PRN  No orders of the defined types were placed in this encounter.    HISTORY OF PRESENTING ILLNESS:   Alan Henry. 65 y.o. male is here because of nasopharyngeal cancer.  Oncology History Overview Note  Alan Henry. is a 65 y.o. male who presented with six-month history of gradually  enlarging bilateral lymph nodes with associated mild discomfort and pressure.   Subsequently, the patient saw Dr. Wilburn Cornelia, who recommended CT scan of neck and ultrasound-guided needle core biopsy for soft tissue diagnosis.   Biopsy of right neck lymph node on 08/30/2020 revealed: squamous cell carcinoma, p16 positive. EBV ordered, negative.  He had CT soft tissue neck done on August 07, 2020 which showed nasopharyngeal soft tissue prominence, greatest soft tissue effacement of adjacent right parapharyngeal fat suspected to be at least enlarged right retropharyngeal lymph node.  Bulky bilateral cervical midline likely right intraparotid lymphadenopathy  PET/CT scan shows intense FDG uptake with area of increased soft tissue fullness in the posterior nasopharynx.  Extensive bulky bilateral FDG avid cervical adenopathy.  Large FDG avid lymph node also identified within the right parotid gland.  Imaging findings compatible with metastatic adenopathy.  Subcentimeter right supraclavicular lymph node exhibits mild FDG uptake of low background activity equivocal for nodal metastasis.  No additional signs of metastatic disease.  He had  right neck lymph node biopsy which is positive for squamous cell carcinoma, p16 positive, EBV negative  During his initial visit, we recommended concurrent CRT followed by adjuvant chemotherapy. We discussed about every 21 day cisplatin, but he was very reluctant to proceed with every 21 days cisplatin, hence now on weekly cisplatin.    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[place])  10/04/2020 Initial Diagnosis   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[place])   10/04/2020 Cancer Staging   Staging form: Pharynx - Nasopharynx, AJCC 8th Edition - Clinical stage from 10/04/2020: Stage IVA (cT1, cN3, cM0) - Signed by Eppie Gibson, MD on 10/04/2020 Stage prefix: Initial diagnosis   Nasopharynx cancer ([place])  10/08/2020 Initial Diagnosis   Nasopharynx cancer (  [place])   10/31/2020 -   Chemotherapy    Patient is on Treatment Plan: HEAD/NECK CISPLATIN Q7D + XRT X 6 CYCLES / CISPLATIN D1 + 5FU IVCI D1-4 Q28D X 3 CYCLES       Interval History  He started chemotherapy on 10/30/2020 He is here before planned C2 of cisplatin. He is doing poorly, feels very tired, since Monday. He has been able to eat but mostly soups, light food. He hasnt started using G tube yet. He is happy with reduction of neck mass. He is constipated, using dulcolax as needed. No change in breathing. He is emotional because he is worried how he can handle rest of the treatment. No change in urinary habits No tinnitus, neuropathy, urine pale.  Rest of the pertinent 10 point ROS reviewed and negative.   MEDICAL HISTORY:   Past Medical History:  Diagnosis Date  . Chest pain    2021  . History of kidney stones   . Hypertension   . Nasopharyngeal cancer ([place])   . Pneumonia   . Sleep apnea     SURGICAL HISTORY: Past Surgical History:  Procedure Laterality Date  . FINE NEEDLE ASPIRATION BIOPSY    . HERNIA REPAIR     Umbilicatl hernia  . IR IMAGING GUIDED PORT INSERTION  10/18/2020  . LAPAROSCOPIC INSERTION GASTROSTOMY TUBE N/A 10/23/2020   Procedure: LAPAROSCOPIC ASSISTED PEG TUBE;  Surgeon: Dwan Bolt, MD;  Location: [facility];  Service: General;  Laterality: N/A;  29  . ROTATOR CUFF REPAIR    . Torn Labrum      SOCIAL HISTORY: Social History   Socioeconomic History  . Marital status: Married    Spouse name: Not on file  . Number of children: Not on file  . Years of education: Not on file  . Highest education level: Not on file  Occupational History  . Not on file  Tobacco Use  . Smoking status: Never Smoker  . Smokeless tobacco: Never Used  Vaping Use  . Vaping Use: Never used  Substance and Sexual Activity  . Alcohol use: Not Currently    Comment: very rarely  . Drug use: No  . Sexual activity: Not Currently  Other Topics Concern  . Not on file  Social History  Narrative  . Not on file   Social Determinants of Health   Financial Resource Strain: Not on file  Food Insecurity: No Food Insecurity  . Worried About Charity fundraiser in the Last Year: Never true  . Ran Out of Food in the Last Year: Never true  Transportation Needs: No Transportation Needs  . Lack of Transportation (Medical): No  . Lack of Transportation (Non-Medical): No  Physical Activity: Not on file  Stress: No Stress Concern Present  . Feeling of Stress : Not at all  Social Connections: Socially Integrated  . Frequency of Communication with Friends and Family: More than three times a week  . Frequency of Social Gatherings with Friends and Family: Twice a week  . Attends Religious Services: More than 4 times per year  . Active Member of Clubs or Organizations: Yes  . Attends Archivist Meetings: 1 to 4 times per year  . Marital Status: Married  Human resources officer Violence: Not on file    FAMILY HISTORY: No family history on file.  ALLERGIES:  has No Known Allergies.  MEDICATIONS:  Current Outpatient Medications  Medication Sig Dispense Refill  . lidocaine (XYLOCAINE) 2 % solution Patient: Mix 1part  2% viscous lidocaine, 1part H20. Swish & swallow 63mL of diluted mixture, 47min before meals and at bedtime, up to QID 200 mL 3  . acetaminophen (TYLENOL) 500 MG tablet Take 2 tablets (1,000 mg total) by mouth every 8 (eight) hours as needed for mild pain. 30 tablet 0  . amLODipine (NORVASC) 10 MG tablet Take 10 mg by mouth daily.    Marland Kitchen atorvastatin (LIPITOR) 80 MG tablet Take 80 mg by mouth daily.    . cetirizine (ZYRTEC) 10 MG tablet Take 10 mg by mouth daily.    Marland Kitchen dexamethasone (DECADRON) 4 MG tablet Take 2 tablets (8 mg total) by mouth daily. Take daily x 3 days starting the day after cisplatin chemotherapy. Take with food. 30 tablet 1  . docusate sodium (COLACE) 100 MG capsule Take 1 capsule (100 mg total) by mouth 2 (two) times daily. 60 capsule 0  .  ergocalciferol (VITAMIN D2) 1.25 MG (50000 UT) capsule Take 50,000 Units by mouth once a week. Wednesday    . lidocaine-prilocaine (EMLA) cream Apply to affected area once 30 g 3  . LORazepam (ATIVAN) 0.5 MG tablet Take 1 tablet (0.5 mg total) by mouth every 6 (six) hours as needed (Nausea or vomiting). 30 tablet 0  . nitroGLYCERIN (NITROSTAT) 0.4 MG SL tablet Place 0.4 mg under the tongue every 5 (five) minutes x 3 doses as needed for chest pain.    Marland Kitchen omeprazole (PRILOSEC) 20 MG capsule Take 20 mg by mouth daily.    . ondansetron (ZOFRAN) 8 MG tablet Take 1 tablet (8 mg total) by mouth 2 (two) times daily as needed. Start on the third day after cisplatin chemotherapy. 30 tablet 1  . prochlorperazine (COMPAZINE) 10 MG tablet Take 1 tablet (10 mg total) by mouth every 6 (six) hours as needed (Nausea or vomiting). 30 tablet 1  . sildenafil (VIAGRA) 50 MG tablet Take 50-100 mg by mouth daily as needed for erectile dysfunction.     No current facility-administered medications for this visit.     PHYSICAL EXAMINATION:  ECOG PERFORMANCE STATUS: 0 - Asymptomatic  Vitals:   11/06/20 1112  BP: 133/75  Pulse: 70  Resp: 20  Temp: 98.7 F (37.1 C)  SpO2: 100%   Filed Weights   11/06/20 1112  Weight: 184 lb 12.8 oz (83.8 kg)    GENERAL:alert, no distress and comfortable SKIN: skin color, texture, turgor are normal, no rashes or significant lesions EYES: normal, conjunctiva are pink and non-injected, sclera clear OROPHARYNX: one oral ulcer, otherwise no mucositis, dry mucosal membranes NECK: Large bulky bilateral lymphadenopathy noted in the neck region, improving in size. LYMPH:  no palpable lymphadenopathy axillary or inguinal LUNGS: clear to auscultation and percussion with normal breathing effort HEART: regular rate & rhythm and no murmurs and no lower extremity edema ABDOMEN:abdomen soft, non-tender and normal bowel sounds Musculoskeletal:no cyanosis of digits and no clubbing  PSYCH:  alert & oriented x 3 with fluent speech NEURO: no focal motor/sensory deficits  LABORATORY DATA:  I have reviewed the data as listed Lab Results  Component Value Date   WBC 5.1 11/06/2020   HGB 13.9 11/06/2020   HCT 42.5 11/06/2020   MCV 88.5 11/06/2020   PLT 257 11/06/2020     Chemistry      Component Value Date/Time   NA 138 11/06/2020 1056   K 3.8 11/06/2020 1056   CL 98 11/06/2020 1056   CO2 30 11/06/2020 1056   BUN 19 11/06/2020 1056   CREATININE 1.23  11/06/2020 1056      Component Value Date/Time   CALCIUM 9.4 11/06/2020 1056   ALKPHOS 88 11/06/2020 1056   AST 16 11/06/2020 1056   ALT 36 11/06/2020 1056   BILITOT 0.5 11/06/2020 1056       RADIOGRAPHIC STUDIES: I have personally reviewed the radiological images as listed and agreed with the findings in the report. IR IMAGING GUIDED PORT INSERTION  Result Date: 10/18/2020 INDICATION: 65 year old male with head and neck cancer and locoregional metastatic lymphadenopathy. He presents for port catheter placement for durable venous access. EXAM: IMPLANTED PORT A CATH PLACEMENT WITH ULTRASOUND AND FLUOROSCOPIC GUIDANCE MEDICATIONS: None ANESTHESIA/SEDATION: Versed 1 mg IV; Fentanyl 50 mcg IV; Moderate Sedation Time:  23 minutes The patient was continuously monitored during the procedure by the interventional radiology nurse under my direct supervision. FLUOROSCOPY TIME:  0 minutes, 48 seconds (3 mGy) COMPLICATIONS: None immediate. PROCEDURE: The right neck and chest was prepped with chlorhexidine, and draped in the usual sterile fashion using maximum barrier technique (cap and mask, sterile gown, sterile gloves, large sterile sheet, hand hygiene and cutaneous antiseptic). Local anesthesia was attained by infiltration with 1% lidocaine with epinephrine. Ultrasound demonstrated patency of the left internal jugular vein vein, and this was documented with an image. Under real-time ultrasound guidance, this vein was accessed with a 21  gauge micropuncture needle and image documentation was performed. A small dermatotomy was made at the access site with an 11 scalpel. A 0.018" wire was advanced into the SVC and the access needle exchanged for a 73F micropuncture vascular sheath. The 0.018" wire was then removed and a 0.035" wire advanced into the IVC. An appropriate location for the subcutaneous reservoir was selected below the clavicle and an incision was made through the skin and underlying soft tissues. The subcutaneous tissues were then dissected using a combination of blunt and sharp surgical technique and a pocket was formed. A single lumen power injectable portacatheter was then tunneled through the subcutaneous tissues from the pocket to the dermatotomy and the port reservoir placed within the subcutaneous pocket. The venous access site was then serially dilated and a peel away vascular sheath placed over the wire. The wire was removed and the port catheter advanced into position under fluoroscopic guidance. The catheter tip is positioned in the superior cavoatrial junction. This was documented with a spot image. The portacatheter was then tested and found to flush and aspirate well. The port was flushed with saline followed by 100 units/mL heparinized saline. The pocket was then closed in two layers using first subdermal inverted interrupted absorbable sutures followed by a running subcuticular suture. The epidermis was then sealed with Dermabond. The dermatotomy at the venous access site was also closed with Dermabond. IMPRESSION: Successful placement of a left IJ approach Power Port with ultrasound and fluoroscopic guidance. The catheter is ready for use. Electronically Signed   By: Jacqulynn Cadet M.D.   On: 10/18/2020 10:39    All questions were answered. The patient knows to call the clinic with any problems, questions or concerns. I spent 30 minutes in the care of this patient including H and P, review of records, counseling and  coordination of care.     Benay Pike, MD 11/06/2020 1:31 PM

## 2020-11-06 NOTE — Assessment & Plan Note (Signed)
Nausea mostly without vomiting Use prn Zofran vs compazine.

## 2020-11-07 ENCOUNTER — Ambulatory Visit: Payer: 59 | Attending: Radiation Oncology

## 2020-11-07 ENCOUNTER — Inpatient Hospital Stay: Payer: 59

## 2020-11-07 ENCOUNTER — Other Ambulatory Visit: Payer: Self-pay

## 2020-11-07 ENCOUNTER — Encounter: Payer: Self-pay | Admitting: General Practice

## 2020-11-07 ENCOUNTER — Ambulatory Visit
Admission: RE | Admit: 2020-11-07 | Discharge: 2020-11-07 | Disposition: A | Discharge status: Home / Self Care | Payer: 59 | Source: Ambulatory Visit | Attending: Radiation Oncology | Admitting: Radiation Oncology

## 2020-11-07 ENCOUNTER — Inpatient Hospital Stay: Payer: 59 | Admitting: Nutrition

## 2020-11-07 ENCOUNTER — Ambulatory Visit: Payer: 59 | Admitting: Physical Therapy

## 2020-11-07 ENCOUNTER — Encounter: Payer: Self-pay | Admitting: Physical Therapy

## 2020-11-07 VITALS — BP 111/74 | HR 70 | Temp 97.8°F | Resp 18

## 2020-11-07 DIAGNOSIS — Z5111 Encounter for antineoplastic chemotherapy: Secondary | ICD-10-CM | POA: Diagnosis not present

## 2020-11-07 DIAGNOSIS — R634 Abnormal weight loss: Secondary | ICD-10-CM | POA: Diagnosis not present

## 2020-11-07 DIAGNOSIS — R1310 Dysphagia, unspecified: Secondary | ICD-10-CM | POA: Diagnosis present

## 2020-11-07 DIAGNOSIS — R293 Abnormal posture: Secondary | ICD-10-CM | POA: Diagnosis present

## 2020-11-07 DIAGNOSIS — C119 Malignant neoplasm of nasopharynx, unspecified: Secondary | ICD-10-CM

## 2020-11-07 DIAGNOSIS — C113 Malignant neoplasm of anterior wall of nasopharynx: Secondary | ICD-10-CM | POA: Insufficient documentation

## 2020-11-07 MED ORDER — OSMOLITE 1.5 CAL PO LIQD: ORAL | 0 refills | Status: DC

## 2020-11-07 MED ORDER — SODIUM CHLORIDE 0.9 % IV SOLN
40.0000 mg/m2 | Freq: Once | INTRAVENOUS | Status: AC
  Administered 2020-11-07: 81 mg via INTRAVENOUS
  Filled 2020-11-07: qty 81

## 2020-11-07 MED ORDER — SODIUM CHLORIDE 0.9 % IV SOLN
10.0000 mg | Freq: Once | INTRAVENOUS | Status: AC
  Administered 2020-11-07: 10 mg via INTRAVENOUS
  Filled 2020-11-07: qty 10

## 2020-11-07 MED ORDER — MAGNESIUM SULFATE 2 GM/50ML IV SOLN
INTRAVENOUS | Status: AC
  Filled 2020-11-07: qty 50

## 2020-11-07 MED ORDER — POTASSIUM CHLORIDE IN NACL 20-0.9 MEQ/L-% IV SOLN
Freq: Once | INTRAVENOUS | Status: AC
Start: 2020-11-07 — End: 2020-11-07
  Filled 2020-11-07: qty 1000

## 2020-11-07 MED ORDER — MAGNESIUM SULFATE 2 GM/50ML IV SOLN
2.0000 g | Freq: Once | INTRAVENOUS | Status: AC
  Administered 2020-11-07: 2 g via INTRAVENOUS

## 2020-11-07 MED ORDER — SODIUM CHLORIDE 0.9 % IV SOLN
Freq: Once | INTRAVENOUS | Status: AC
  Filled 2020-11-07: qty 250

## 2020-11-07 MED ORDER — SODIUM CHLORIDE 0.9 % IV SOLN
150.0000 mg | Freq: Once | INTRAVENOUS | Status: AC
  Administered 2020-11-07: 150 mg via INTRAVENOUS
  Filled 2020-11-07: qty 150

## 2020-11-07 MED ORDER — PALONOSETRON HCL INJECTION 0.25 MG/5ML
INTRAVENOUS | Status: AC
  Filled 2020-11-07: qty 5

## 2020-11-07 MED ORDER — HEPARIN SOD (PORK) LOCK FLUSH 100 UNIT/ML IV SOLN
500.0000 [IU] | Freq: Once | INTRAVENOUS | Status: AC | PRN
Start: 2020-11-07 — End: 2020-11-07
  Administered 2020-11-07: 500 [IU]
  Filled 2020-11-07: qty 5

## 2020-11-07 MED ORDER — PALONOSETRON HCL INJECTION 0.25 MG/5ML
0.2500 mg | Freq: Once | INTRAVENOUS | Status: AC
  Administered 2020-11-07: 0.25 mg via INTRAVENOUS

## 2020-11-07 MED ORDER — SODIUM CHLORIDE 0.9% FLUSH
10.0000 mL | INTRAVENOUS | Status: DC | PRN
  Administered 2020-11-07: 10 mL
  Filled 2020-11-07: qty 10

## 2020-11-07 NOTE — Progress Notes (Signed)
[facility] CSW Progress Notes  Met with patient in infusion as part of [facility].  He was accompanied by his wife.  She has stayed in [place] to care for him.  Typically, she and patient travel to/from [place] in order to care for their grandchildren (ranging in age from 37 - 22).  During his current illness, she has stayed here to care for him.  His wife is disabled from a stroke, does not drive, uses a cane for walking, has some mobility limitations.  He is retired from the city of [employer] - he is accustomed to being able to take care of himself, be fully independent, drive, care for wife and others.  It is difficult for him to accept help from others, he prefers to be the helper.    Discussed ways to adapt to his current physical limitations, using problem solving techniques he utilized while working. At this point he cannot drive due to "stitches and this feeding tube."  He is also fatigued and physically weaker than before.  Brainstormed options for non medical appointments (he uses [employer] for rides to/from [facility]) including referral to [employer] [place] disability transportation, making an arrangement with a friend for a designated time of driving to accomplish various errands, using online resources to meet his needs at home.  CSW team will help him complete at [place] application so he can access these services.  Edwyna Shell, LCSW Clinical Social Worker Phone:  [phone]

## 2020-11-07 NOTE — Patient Instructions (Signed)
SWALLOWING EXERCISES Do these until 6 months after your last day of radiation, then 2-3 times per week afterwards  1. Effortful Swallows - Press your tongue against the roof of your mouth for 3 seconds, then squeeze the muscles in your neck while you swallow your saliva or a sip of water - Repeat 10-15 times, 2-3 times a day, and use whenever you eat or drink  2. Masako Swallow - swallow with your tongue sticking out - Stick tongue out past your lips and gently bite tongue with your teeth - Swallow, while holding your tongue with your teeth - Repeat 10-15 times, 2-3 times a day *use a wet spoon if your mouth gets dry*  3. Pitch Raise - Repeat "he", once per second in as high of a pitch as you can - Repeat 20 times, 2-3 times a day  4. Mendelsohn Maneuver - "half swallow" exercise - Start to swallow, and keep your Adam's apple up by squeezing hard with the muscles of the throat - Hold the squeeze for 5-7 seconds and then relax - Repeat 10-15 times, 2-3 times a day *use a wet spoon if your mouth gets dry* 

## 2020-11-07 NOTE — Progress Notes (Signed)
[facility] CSW Progress Notes  [employer] [place]/disability transportation application submitted on patient behalf.  CSW submitted Part B Professional Verification form.  Patient has Part A - his part of the application - to review and submit on his own.  He would like help w non medical transportation as neither he nor his spouse drive at this time.  Edwyna Shell, LCSW Clinical Social Worker Phone:  [phone]

## 2020-11-07 NOTE — Therapy (Signed)
[facility] [address] [place], [place], [address] Phone: [phone]   Fax:  [phone]  Speech Language Pathology Evaluation  Patient Details  Name: Alan Henry Sr. MRN: [id number] Date of Birth: 10/31/1955 Referring Provider (SLP): Eppie Gibson   Encounter Date: 11/07/2020   End of Session - 11/07/20 1203    Visit Number 1    Number of Visits 7    Date for SLP Re-Evaluation 02/05/21    SLP Start Time 7106    SLP Stop Time  1100    SLP Time Calculation (min) 37 min    Activity Tolerance Patient tolerated treatment well           Past Medical History:  Diagnosis Date  . Chest pain    2021  . History of kidney stones   . Hypertension   . 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 Pneumonia   . Sleep apnea     Past Surgical History:  Procedure Laterality Date  . FINE NEEDLE ASPIRATION BIOPSY    . HERNIA REPAIR     Umbilicatl hernia  . IR IMAGING GUIDED PORT INSERTION  10/18/2020  . LAPAROSCOPIC INSERTION GASTROSTOMY TUBE N/A 10/23/2020   Procedure: LAPAROSCOPIC ASSISTED PEG TUBE;  Surgeon: Dwan Bolt, MD;  Location: [facility];  Service: General;  Laterality: N/A;  35  . ROTATOR CUFF REPAIR    . Torn Labrum      There were no vitals filed for this visit.   Subjective Assessment - 11/07/20 1031    Subjective Pt has to chew things up finer for them to clear pharyngeally.    Currently in Pain? No/denies              SLP Evaluation [facility] - 11/07/20 1031      SLP Visit Information   SLP Received On 11/07/20    Referring Provider (SLP) Eppie Gibson    Onset Date fall-winter 2021    Medical Diagnosis SCCA of rt neck lymph node, nasopharyngeal cancer primary      Subjective   Patient/Family Stated Goal "I wanna keep my swallowing just as good as it is now."      General Information   HPI Dr. Wilburn Cornelia ordered CT neck and on 08/07/20 revealed nasopharyngeal soft tissue prominence with soft tissue  effacement of adjacent rt parapharyngeal fat with suspect reflection of enlarged rt retropharyngeal lymph node. Guided biopsy 08-30-20 revealed SCCA, p16+. A PET 09-27-20 revealed intense FDG uptake within the area of incr'd soft tissue fullness in posterior nasopharynx. Extensive bulky bil FDG avid cervical adenopathy. FEG avid lymph node was ID'd within the rt parotid gland compatible with metastatic adenopathy. Nodal metasasis was suggested due to a sub-centimeter rt supraclavicular lymph node with mild FDG uptake above background activity. PEG placed 10-23-20. ChRT (weekly Cisplatin) planned to be completed on 12-12-20.      Prior Functional Status   Cognitive/Linguistic Baseline Within functional limits      Cognition   Overall Cognitive Status Within Functional Limits for tasks assessed      Auditory Comprehension   Overall Auditory Comprehension Appears within functional limits for tasks assessed      Oral Motor/Sensory Function   Overall Oral Motor/Sensory Function Appears within functional limits for tasks assessed      Motor Speech   Overall Motor Speech Appears within functional limits for tasks assessed           Pt currently tolerates regular diet/thin liquids with extra  mastication with tougher foods- he denies overt s/sx aspiration with POs.POs: Pt swallowed potato chips and drank waterwithout overt s/s aspiration. Thyroid elevation appeared adequate, and swallows appearedtimely. Pt's swallow deemedWNL/WFLat this time.   Because data states the risk for dysphagia during and after radiation treatment is high due to undergoing radiation tx, SLP taught pt about the possibility of reduced/limited ability for PO intake during rad tx. SLP encouraged pt to continue swallowing POs as far into rad tx as possible.  SLP educated pt re: changes to swallowing musculature after rad tx, and why adherence to dysphagia HEP provided today and PO consumption was necessary to inhibit muscle  fibrosis following rad tx. Pt demonstrated understanding of these things to SLP.   SLP then developed a HEP for pt and pt was instructed how to perform exercises involving lingual, vocal, and pharyngeal strengthening. SLP performed each exercise and pt return demonstrated each exercise. SLP ensured pt performance was correct prior to moving on to next exercise. Pt was instructed to complete this program2times a day, 6-7 days/week until 6 months after his last rad tx, then x2 a week after that. SLP also shared to perform non-swallowing exercises on the days when he cannot perform swallowing portion of HEP to the desired rep numbers and to cycle through the swallowing portion so he can complete the program of exercises instead of fatiguing on one of the three swallowing exercises and not performing the other two swallowing exercises.          SLP Education - 11/07/20 1556        Education Details HEP procedure, late effects head/neck radiation on swallow ability     Person(s) Educated Patient     Methods Explanation;Demonstration;Verbal cues;Handout     Comprehension Verbalized understanding;Returned demonstration;Verbal cues required;Need further instruction                 SLP Short Term Goals - 11/07/20 1602             SLP SHORT TERM GOAL #1    Title pt will complete HEP with rare min A     Time 2     Period -- sessions, for all STGs    Status New          SLP SHORT TERM GOAL #2    Title pt will tell SLP why pt is completing HEP with modified independence     Time 2     Status New          SLP SHORT TERM GOAL #3    Title pt will describe 3 overt s/s aspiration PNA with modified independence     Time 2     Status New          SLP SHORT TERM GOAL #4    Title pt will tell SLP how a food journal could hasten return to a more normalized diet     Time 3     Status New                        SLP Long Term Goals - 5/5/221603             SLP LONG TERM GOAL #1    Title pt will complete HEP with modified independence over 2 visits     Time 4     Period -- or 7 total sessions, for all LTGs    Status New  SLP LONG TERM GOAL #2    Title pt will describe how to modify HEP over time, and the timeline associated with reduction in HEP frequency with modified independence over two sessions     Time 7     Status New                 Plan - 11/07/20 1557        Clinical Impression Statement At this time pt swallowing is deemed WNL/WFL with regular diet (potato chips today) and thin liquids. SLP designed an individualized HEP for dysphagia and pt completed each exercise on their own with min-mod SLP cues faded to modified independent. Data indicate that pt's swallow ability will likely decrease over the course of radiation therapy and could very well decline over time following conclusion of their radiation therapy due to muscle disuse atrophy and/or muscle fibrosis. Pt will cont to need to be seen by SLP in order to assess safety of PO intake, assess the need for recommending any objective swallow assessment, and ensuring pt correctly completes the individualized HEP.                              Patient will benefit from skilled therapeutic intervention in order to improve the following deficits and impairments:   No diagnosis found.    Problem List Patient Active Problem List   Diagnosis Date Noted  . Chemotherapy induced nausea and vomiting 11/06/2020  . Weight loss, unintentional 11/06/2020  . Constipation 11/06/2020  . Port-A-Cath in place 10/31/2020  . 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 Essential hypertension 11/14/2019  . Hyperlipidemia 11/14/2019  . Family history of heart  disease 11/14/2019  . Chest pain of uncertain etiology 93/57/0177  . Nonspecific abnormal electrocardiogram (ECG) (EKG) 11/14/2019    [facility] 11/07/2020, 12:04 PM  [facility] [address] [place], [place], [address] Phone: [phone]   Fax:  [phone]  Name: ZACH TIETJE Sr. MRN: [id number] Date of Birth: Jul 05, 1956

## 2020-11-07 NOTE — Therapy (Signed)
[facility] [place], [place], [address] Phone: [phone]   Fax:  [phone]  Physical Therapy Evaluation  Patient Details  Name: Alan WEDDINGTON Sr. MRN: [id number] Date of Birth: 11/07/1955 Referring Provider (PT): Reita May Date: 11/07/2020   PT End of Session - 11/07/20 1142    Visit Number 1    Number of Visits 2    Date for PT Re-Evaluation 01/02/21    PT Start Time 1100    PT Stop Time 1120    PT Time Calculation (min) 20 min    Activity Tolerance Patient tolerated treatment well    Behavior During Therapy [facility] for tasks assessed/performed           Past Medical History:  Diagnosis Date  . Chest pain    2021  . History of kidney stones   . Hypertension   . 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 Pneumonia   . Sleep apnea     Past Surgical History:  Procedure Laterality Date  . FINE NEEDLE ASPIRATION BIOPSY    . HERNIA REPAIR     Umbilicatl hernia  . IR IMAGING GUIDED PORT INSERTION  10/18/2020  . LAPAROSCOPIC INSERTION GASTROSTOMY TUBE N/A 10/23/2020   Procedure: LAPAROSCOPIC ASSISTED PEG TUBE;  Surgeon: Dwan Bolt, MD;  Location: [facility];  Service: General;  Laterality: N/A;  67  . ROTATOR CUFF REPAIR    . Torn Labrum      There were no vitals filed for this visit.    Subjective Assessment - 11/07/20 1140    Subjective I feel better today. Yesterday I didn't feel well.    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 There was soft tissue effacement of the adjacent right parapharyngeal fat, suspected to at least partially reflect an enlarged right retropharyngeal lymph node. Additionally,  there was bulk bilateral cervical, and likely right intraparotid, lymphadenopathy, 08/30/20 US guided biopsy revealed squamous carcinoma, p 16 +, EBV -, 3/25 PET revealed intense FDG uptake within the area of increased soft tissue fullness in the posterior nasopharynx. Primary nasopharyngeal neoplasm could not be excluded. Additionally, there was extensive, bulky, bilateral FDG avid cervical adenopathy. A large FDG avid lymph node was also identified within the right parotid gland. Imaging findings were compatible with metastatic adenopathy. Finally, there was a sub-centimeter right supraclavicular lymph node that exhibited mild FDG uptake above background activity, equivocal for nodal metastasis. There were no additional signs of thoracic, abdominal, or pelvic metastasis. There was no evidence for osseous metastatic disease, will receive 35 fractions to his nasopharyngeal area and bilateral neck with weekly cisplatin ([place]). He started on 10/24/20 and will complete on 12/12/20, 10/18/20 PAC placed. PEG to be placed by surgery on 4/20    Patient Stated Goals to gain info from providers    Currently in Pain? No/denies    Pain Score 0-No pain              [facility] PT Assessment - 11/07/20 0001      Assessment   Medical Diagnosis nasopharyngeal cancer    Referring Provider (PT) Isidore Moos    Onset Date/Surgical Date 08/30/20    Hand Dominance Right    Prior Therapy none  Precautions   Precautions Other (comment)    Precaution Comments active cancer      Restrictions   Weight Bearing Restrictions No      Balance Screen   Has the patient fallen in the past 6 months No    Has the patient had a decrease in activity level because of a fear of falling?  No    Is the patient reluctant to leave their home because of a fear of falling?  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    Leisure pt does not currently exercise but reports he is active      Cognition   Overall Cognitive Status Within Functional Limits for tasks assessed      Observation/Other Assessments   Observations pt supine in bed during visit while receiving chemo    Skin Integrity has R neck mass      Functional Tests   Functional tests Sit to Stand      Sit to Stand   Comments unable to assess as pt was receiving chemo      Posture/Postural Control   Posture/Postural Control Postural limitations    Postural Limitations Forward head;Rounded Shoulders    Posture Comments somewhat hard to assess since pt was supine      ROM / Strength   AROM / PROM / Strength AROM      AROM   Overall AROM Comments shoulder ROM WFL though hx of 2 L shoulder surgeries    AROM Assessment Site Cervical    Cervical Flexion WFL    Cervical Extension WFL    Cervical - Right Side Bend WFL    Cervical - Left Side Bend WFL    Cervical - Right Rotation WFL    Cervical - Left Rotation [facility]      Ambulation/Gait   Gait Comments unable to assess as pt was receiving chemo             LYMPHEDEMA/ONCOLOGY QUESTIONNAIRE - 11/07/20 0001      Lymphedema Assessments   Lymphedema Assessments Head and Neck      Head and Neck   4 cm superior to sternal notch around neck 41.9 cm    6 cm superior to sternal notch around neck 42 cm    8 cm superior to sternal notch around neck 42.9 cm    Other all taken with pt in supine                   Objective measurements completed on examination: See above findings.               PT Education - 11/07/20 1141    Education Details Neck ROM, importance of posture when sitting, standing and lying down, deep breathing, walking program and importance of staying active throughout  treatment, CURE article on staying active, "Why exercise?" flyer, lymphedema and PT info    Person(s) Educated Patient;Spouse    Methods Explanation;Handout    Comprehension Verbalized understanding               PT Long Term Goals -  11/07/20 1145      PT LONG TERM GOAL #1   Title Pt will return to baseline ROM measurements and not demonstrate any signs or symptoms of lymphedema.    Time 8    Period Weeks    Status New    Target Date 01/02/21              Head and Neck Clinic Goals - 11/07/20 1145      Patient will be able to verbalize understanding of a home exercise program for cervical range of motion, posture, and walking.    Time 1    Period Days    Status Achieved      Patient will be able to verbalize understanding of proper sitting and standing posture.    Time 1    Period Days    Status Achieved      Patient will be able to verbalize understanding of lymphedema risk and availability of treatment for this condition.    Time 1    Period Days    Status Achieved              Plan - 11/07/20 1142    Clinical Impression Statement Pt presents to PT with recently diagnosed nasopharyngeal cancer. He was seen today while receiving chemotherapy so PT was unable to assess gait and 30 sec sit to stand. Pt's cervical ROM is WFL and his shoulder ROM is WFL though he does have a history of L shoulder surgery for a torn labrum and spurs.Educated pt about signs and symptoms of lymphedema as well as anatomy and physiology of lymphatic system. Educated pt in importance of staying as active as possible throughout treatment to decrease fatigue as well as head and neck ROM exercises to decrease loss of ROM. Will see pt after completion of radiation to reassess ROM and assess for lymphedema to determine therapy needs at that time.    Stability/Clinical Decision Making Stable/Uncomplicated    Clinical Decision Making Low    Rehab Potential Good    PT Frequency  --   eval and 1 f/u   PT Duration 8 weeks    PT Treatment/Interventions ADLs/Self Care Home Management;Patient/family education;Therapeutic exercise    PT Next Visit Plan reassess baselines    PT Home Exercise Plan head and neck ROM exercises    Consulted and Agree with Plan of Care Patient           Patient will benefit from skilled therapeutic intervention in order to improve the following deficits and impairments:  Postural dysfunction,Decreased knowledge of precautions  Visit Diagnosis: Abnormal posture  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 Chemotherapy induced nausea and vomiting 11/06/2020  . Weight loss, unintentional 11/06/2020  . Constipation 11/06/2020  . Port-A-Cath in place 10/31/2020  . 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 Essential hypertension 11/14/2019  . Hyperlipidemia 11/14/2019  . Family history of heart disease 11/14/2019  . Chest pain of uncertain etiology 02/58/5277  . Nonspecific abnormal electrocardiogram (ECG) (EKG) 11/14/2019    [facility] 11/07/2020, 11:50 AM  [facility] [place], [place], [address] Phone: [phone]   Fax:  [phone]  Name: DEVELLE SIEVERS Sr. MRN: [id number] Date of Birth: 12/13/1955  Manus Gunning, PT 11/07/20 11:50 AM

## 2020-11-07 NOTE — Progress Notes (Signed)
Nutrition follow-up completed with patient during infusion for nasopharyngeal cancer of the soft palate.  He received concurrent chemoradiation therapy. Status postsurgical G-tube on April 20. Weight increased slightly and documented as 184.8 pounds May 4, increased from 182.5 pounds April 28. Patient reports increased fatigue.  He has had some constipation however this is improved.  He reports a little nausea which improved after taking nausea medication. He has taste alterations therefore decreasing oral intake.  He can taste sweet a little bit.  Dietary recall reveals patient can tolerate eggs, vegetable noodles soup, pancakes, tangerines, and an apple cut up.  He is tired of vanilla Ensure.  He is having a hard time forcing himself to eat secondary to no taste.  Estimated nutrition needs: 2400-2600 cal, 110-126 g protein, 2.6 L fluid.  Nutrition diagnosis: Food and nutrition related knowledge deficit continues.  Intervention: Will begin Osmolite 1.5, 1 carton 4 times daily with 60 mL free water before and after bolus feedings. Patient will increase tube feeding to 2 cartons 3 times daily and 1 carton 1 times daily to total 7 cartons a day.  He will drink or flush feeding tube with an additional 240 mL 3 times a day between feedings.  He will use a 60 mL free water flush before and after bolus feedings 4 times a day.   Tube feeding and free water flushes provide 2485 cal, 104 g protein, 2467 mL free water. Patient and wife were educated and provided a copy of tube feeding plan.  Teach back method used. I have encouraged patient to continue increased oral intake as tolerated.  We discussed increasing variety of flavors to enhance oral intake. Tube feeding orders written and adapt health notified.  Monitoring, evaluation, goals: Patient will tolerate oral intake plus tube feeding to meet greater than 90% estimated nutrition needs.  Next visit: Thursday, May 12 during infusion.  **Disclaimer:  This note was dictated with voice recognition software. Similar sounding words can inadvertently be transcribed and this note may contain transcription errors which may not have been corrected upon publication of note.**

## 2020-11-07 NOTE — Progress Notes (Signed)
Oncology Nurse Navigator Documentation  Alan Henry met with Alan Henry SLP and Alan Henry PT while receiving his chemotherapy in infusion today. He reports that he is managing at this time. He knows to call me if he needs me for anything.  Harlow Asa RN, BSN, OCN Head & Neck Oncology Nurse [place] at [facility] Phone # [phone]  Fax # [phone]

## 2020-11-07 NOTE — Patient Instructions (Signed)
[facility] MEDICAL [facility]COLOGY  Discharge Instructions: Thank you for choosing [facility] to provide your oncology and hematology care.   If you have a lab appointment with the Cancer Center, please go directly to the Cancer Center and check in at the registration area.   Wear comfortable clothing and clothing appropriate for easy access to any Portacath or PICC line.   We strive to give you quality time with your provider. You may need to reschedule your appointment if you arrive late (15 or more minutes).  Arriving late affects you and other patients whose appointments are after yours.  Also, if you miss three or more appointments without notifying the office, you may be dismissed from the clinic at the provider's discretion.      For prescription refill requests, have your pharmacy contact our office and allow 72 hours for refills to be completed.    Today you received the following chemotherapy and/or immunotherapy agents cisplatin   To help prevent nausea and vomiting after your treatment, we encourage you to take your nausea medication as directed.  BELOW ARE SYMPTOMS THAT SHOULD BE REPORTED IMMEDIATELY: *FEVER GREATER THAN 100.4 F (38 C) OR HIGHER *CHILLS OR SWEATING *NAUSEA AND VOMITING THAT IS NOT CONTROLLED WITH YOUR NAUSEA MEDICATION *UNUSUAL SHORTNESS OF BREATH *UNUSUAL BRUISING OR BLEEDING *URINARY PROBLEMS (pain or burning when urinating, or frequent urination) *BOWEL PROBLEMS (unusual diarrhea, constipation, pain near the anus) TENDERNESS IN MOUTH AND THROAT WITH OR WITHOUT PRESENCE OF ULCERS (sore throat, sores in mouth, or a toothache) UNUSUAL RASH, SWELLING OR PAIN  UNUSUAL VAGINAL DISCHARGE OR ITCHING   Items with * indicate a potential emergency and should be followed up as soon as possible or go to the Emergency Department if any problems should occur.  Please show the CHEMOTHERAPY ALERT CARD or IMMUNOTHERAPY ALERT CARD at check-in to the  Emergency Department and triage nurse.  Should you have questions after your visit or need to cancel or reschedule your appointment, please contact [facility]  Dept: [phone]  and follow the prompts.  Office hours are 8:00 a.m. to 4:30 p.m. Monday - Friday. Please note that voicemails left after 4:00 p.m. may not be returned until the following business day.  We are closed weekends and major holidays. You have access to a nurse at all times for urgent questions. Please call the main number to the clinic Dept: [phone] and follow the prompts.   For any non-urgent questions, you may also contact your provider using MyChart. We now offer e-Visits for anyone 18 and older to request care online for non-urgent symptoms. For details visit [email or web address].   Also download the MyChart app! Go to the app store, search "MyChart", open the app, select , and log in with your MyChart username and password.  Due to Covid, a mask is required upon entering the hospital/clinic. If you do not have a mask, one will be given to you upon arrival. For doctor visits, patients may have 1 support person aged 18 or older with them. For treatment visits, patients cannot have anyone with them due to current Covid guidelines and our immunocompromised population.   

## 2020-11-08 ENCOUNTER — Ambulatory Visit
Admission: RE | Admit: 2020-11-08 | Discharge: 2020-11-08 | Disposition: A | Discharge status: Home / Self Care | Payer: 59 | Source: Ambulatory Visit | Attending: Radiation Oncology | Admitting: Radiation Oncology

## 2020-11-08 DIAGNOSIS — R634 Abnormal weight loss: Secondary | ICD-10-CM | POA: Diagnosis not present

## 2020-11-11 ENCOUNTER — Other Ambulatory Visit: Payer: Self-pay

## 2020-11-11 ENCOUNTER — Ambulatory Visit
Admission: RE | Admit: 2020-11-11 | Discharge: 2020-11-11 | Disposition: A | Discharge status: Home / Self Care | Payer: 59 | Source: Ambulatory Visit | Attending: Radiation Oncology | Admitting: Radiation Oncology

## 2020-11-11 DIAGNOSIS — R634 Abnormal weight loss: Secondary | ICD-10-CM | POA: Diagnosis not present

## 2020-11-12 ENCOUNTER — Ambulatory Visit
Admission: RE | Admit: 2020-11-12 | Discharge: 2020-11-12 | Disposition: A | Discharge status: Home / Self Care | Payer: 59 | Source: Ambulatory Visit | Attending: Radiation Oncology | Admitting: Radiation Oncology

## 2020-11-12 DIAGNOSIS — R634 Abnormal weight loss: Secondary | ICD-10-CM | POA: Diagnosis not present

## 2020-11-13 ENCOUNTER — Encounter: Payer: Self-pay | Admitting: Hematology and Oncology

## 2020-11-13 ENCOUNTER — Other Ambulatory Visit (HOSPITAL_COMMUNITY): Payer: Self-pay

## 2020-11-13 ENCOUNTER — Ambulatory Visit
Admission: RE | Admit: 2020-11-13 | Discharge: 2020-11-13 | Disposition: A | Discharge status: Home / Self Care | Payer: 59 | Source: Ambulatory Visit | Attending: Radiation Oncology | Admitting: Radiation Oncology

## 2020-11-13 ENCOUNTER — Other Ambulatory Visit: Payer: Self-pay

## 2020-11-13 ENCOUNTER — Other Ambulatory Visit: Payer: 59

## 2020-11-13 ENCOUNTER — Inpatient Hospital Stay: Payer: 59

## 2020-11-13 ENCOUNTER — Inpatient Hospital Stay (HOSPITAL_BASED_OUTPATIENT_CLINIC_OR_DEPARTMENT_OTHER): Payer: 59 | Admitting: Hematology and Oncology

## 2020-11-13 DIAGNOSIS — K1231 Oral mucositis (ulcerative) due to antineoplastic therapy: Secondary | ICD-10-CM | POA: Insufficient documentation

## 2020-11-13 DIAGNOSIS — C113 Malignant neoplasm of anterior wall of nasopharynx: Secondary | ICD-10-CM

## 2020-11-13 DIAGNOSIS — T451X5A Adverse effect of antineoplastic and immunosuppressive drugs, initial encounter: Secondary | ICD-10-CM

## 2020-11-13 DIAGNOSIS — R634 Abnormal weight loss: Secondary | ICD-10-CM

## 2020-11-13 DIAGNOSIS — T451X5D Adverse effect of antineoplastic and immunosuppressive drugs, subsequent encounter: Secondary | ICD-10-CM

## 2020-11-13 DIAGNOSIS — R112 Nausea with vomiting, unspecified: Secondary | ICD-10-CM | POA: Diagnosis not present

## 2020-11-13 DIAGNOSIS — C119 Malignant neoplasm of nasopharynx, unspecified: Secondary | ICD-10-CM

## 2020-11-13 DIAGNOSIS — Z5111 Encounter for antineoplastic chemotherapy: Secondary | ICD-10-CM | POA: Diagnosis not present

## 2020-11-13 DIAGNOSIS — Z95828 Presence of other vascular implants and grafts: Secondary | ICD-10-CM

## 2020-11-13 LAB — CBC WITH DIFFERENTIAL (CANCER CENTER ONLY)
Abs Immature Granulocytes: 0.01 10*3/uL (ref 0.00–0.07)
Basophils Absolute: 0 10*3/uL (ref 0.0–0.1)
Basophils Relative: 0 %
Eosinophils Absolute: 0 10*3/uL (ref 0.0–0.5)
Eosinophils Relative: 1 %
HCT: 41.4 % (ref 39.0–52.0)
Hemoglobin: 13.5 g/dL (ref 13.0–17.0)
Immature Granulocytes: 0 %
Lymphocytes Relative: 4 %
Lymphs Abs: 0.2 10*3/uL — ABNORMAL LOW (ref 0.7–4.0)
MCH: 29 pg (ref 26.0–34.0)
MCHC: 32.6 g/dL (ref 30.0–36.0)
MCV: 88.8 fL (ref 80.0–100.0)
Monocytes Absolute: 0.3 10*3/uL (ref 0.1–1.0)
Monocytes Relative: 7 %
Neutro Abs: 3.6 10*3/uL (ref 1.7–7.7)
Neutrophils Relative %: 88 %
Platelet Count: 202 10*3/uL (ref 150–400)
RBC: 4.66 MIL/uL (ref 4.22–5.81)
RDW: 14.7 % (ref 11.5–15.5)
WBC Count: 4 10*3/uL (ref 4.0–10.5)
nRBC: 0 % (ref 0.0–0.2)

## 2020-11-13 LAB — CMP (CANCER CENTER ONLY)
ALT: 23 U/L (ref 0–44)
AST: 12 U/L — ABNORMAL LOW (ref 15–41)
Albumin: 3.3 g/dL — ABNORMAL LOW (ref 3.5–5.0)
Alkaline Phosphatase: 77 U/L (ref 38–126)
Anion gap: 9 (ref 5–15)
BUN: 16 mg/dL (ref 8–23)
CO2: 31 mmol/L (ref 22–32)
Calcium: 8.9 mg/dL (ref 8.9–10.3)
Chloride: 98 mmol/L (ref 98–111)
Creatinine: 1.34 mg/dL — ABNORMAL HIGH (ref 0.61–1.24)
GFR, Estimated: 60 mL/min — ABNORMAL LOW (ref >60)
Glucose, Bld: 105 mg/dL — ABNORMAL HIGH (ref 70–99)
Potassium: 4 mmol/L (ref 3.5–5.1)
Sodium: 138 mmol/L (ref 135–145)
Total Bilirubin: 0.6 mg/dL (ref 0.3–1.2)
Total Protein: 7.2 g/dL (ref 6.5–8.1)

## 2020-11-13 LAB — MAGNESIUM: Magnesium: 1.9 mg/dL (ref 1.7–2.4)

## 2020-11-13 MED ORDER — HYDROCODONE-ACETAMINOPHEN 7.5-325 MG/15ML PO SOLN
10.0000 mL | Freq: Three times a day (TID) | ORAL | 0 refills | Status: DC | PRN
  Filled 2020-11-13: qty 473, 15d supply, fill #0

## 2020-11-13 MED ORDER — SODIUM CHLORIDE 0.9% FLUSH
10.0000 mL | INTRAVENOUS | Status: DC | PRN
  Administered 2020-11-13: 10 mL via INTRAVENOUS
  Filled 2020-11-13: qty 10

## 2020-11-13 MED ORDER — HEPARIN SOD (PORK) LOCK FLUSH 100 UNIT/ML IV SOLN
500.0000 [IU] | Freq: Once | INTRAVENOUS | Status: AC
Start: 2020-11-13 — End: 2020-11-13
  Administered 2020-11-13: 500 [IU] via INTRAVENOUS
  Filled 2020-11-13: qty 5

## 2020-11-13 NOTE — Assessment & Plan Note (Signed)
Patient complains of pain in the mouth which limits him from eating anything more than soft food.  Upon exam he has severe mucositis in the roof of the mouth with ulceration and erythema.  I recommended that he start using some liquid hydrocodone every 8 hours as needed for pain in addition to viscous lidocaine for pain management.  This medication has been refilled and sent to [facility].

## 2020-11-13 NOTE — Progress Notes (Signed)
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 Since he is EBV negative, there is no clear role of induction. We discussed role of concurrent chemoradiation followed by adjuvant chemotherapy. He was very anxious about bolus cisplatin dosing. He is now on weekly cisplatin, skipped planned dose of cisplatin first week because of conflicting appointments despite offer to reschedule non chemo appointments to another date. He started C1D1 on 10/31/2020.  ROS, severe fatigue, mild nausea and constiption. Encouraged using G tube for nutrition,I think most of his fatigue is because of poor intake besides ongoing treatment. We discussed about dose reduction but patient would like to try one more treatment with full dose. Lab reviewed, satisfactory to proceed.    Mucositis due to antineoplastic therapy Patient complains of pain in the mouth which limits him from eating anything more than soft food.  Upon exam he has severe mucositis in the roof of the mouth with ulceration and erythema.  I recommended that he start using some liquid hydrocodone every 8 hours as needed for pain in addition to viscous lidocaine for pain management.  This medication has been refilled and sent to [facility].  Chemotherapy induced nausea and vomiting He does complain of some nausea and anxiety with ongoing treatment.  He has been taking Ativan  as needed for management of nausea.  Recommended that he can try Ativan every 8 hours as needed for management of anxiety but for nausea he also has other options.  He expressed understanding.  Weight loss, unintentional Again encouraged use of G-tube.  He has only been eating food and drinking 1-2 ensures a day.  He has Osmolite for G-tube supplementation.  Encouraged him to start using it and follow-up with nutrition.  No orders of the defined types were placed in this encounter.    HISTORY OF PRESENTING ILLNESS:  Alan Leech Sr. 65 y.o. male is here to follow up on chemotherapy with cisplatin and toxicity check.  Oncology History Overview Henry  Alan Barrus. is a 65 y.o. male who presented with six-month history of gradually enlarging bilateral lymph nodes with associated mild discomfort and pressure.   Subsequently, the patient saw Dr. Wilburn Cornelia, who recommended CT scan of neck and ultrasound-guided needle core biopsy for soft tissue diagnosis.   Biopsy of right neck lymph node on 08/30/2020 revealed: squamous cell carcinoma, p16 positive. EBV ordered, negative.  He had CT soft tissue neck done on August 07, 2020 which showed nasopharyngeal soft tissue prominence, greatest soft tissue effacement of adjacent right parapharyngeal fat suspected to be at least enlarged right retropharyngeal lymph node.  Bulky bilateral cervical midline likely right intraparotid lymphadenopathy  PET/CT scan shows intense FDG uptake with area of increased soft tissue fullness in the posterior nasopharynx.  Extensive bulky bilateral FDG avid cervical adenopathy.  Large FDG avid lymph node also identified within the right parotid gland.  Imaging findings compatible with metastatic adenopathy.  Subcentimeter right supraclavicular lymph node exhibits mild FDG uptake of low background activity  equivocal for nodal metastasis.  No additional signs of metastatic disease.  He had  right neck lymph node  biopsy which is positive for squamous cell carcinoma, p16 positive, EBV negative  During his initial visit, we recommended concurrent CRT followed by adjuvant chemotherapy. We discussed about every 21 day cisplatin, but he was very reluctant to proceed with every 21 days cisplatin, hence now on weekly cisplatin.    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HCC)  10/04/2020 Initial Diagnosis   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HCC)   10/04/2020 Cancer Staging   Staging form: Pharynx - Nasopharynx, AJCC 8th Edition - Clinical stage from 10/04/2020: Stage IVA (cT1, cN3, cM0) - Signed by Lonie PeakSquire, Sarah, MD on 10/04/2020 Stage prefix: Initial diagnosis   Nasopharynx cancer (HCC)  10/08/2020 Initial Diagnosis   Nasopharynx cancer (HCC)   10/31/2020 -  Chemotherapy    Patient is on Treatment Plan: HEAD/NECK CISPLATIN Q7D + XRT X 6 CYCLES / CISPLATIN D1 + 5FU IVCI D1-4 Q28D X 3 CYCLES       INTERIM HISTORY  Alan Henry is here for follow-up prior to cycle 3 of planned cisplatin chemotherapy.  He is doing relatively better compared to last week.  He is mood and spirits are better, Ativan helps him relax and improves his nausea.  He is still able to eat some food although he is mostly eating soft food, vegetables, cannot chew meat.  He is still very reluctant to use the G-tube.  He has been drinking 1-2 ensures a day. He complains of occasional nausea and some anxiety, otherwise no side effects.  No change in breathing.  He is quite impressed with the response in his neck.  He did notice a lot of pain in his mouth recently, uses viscous lidocaine as needed.  He was wondering if there is something more we can do about this mild pain.  No change in breathing.  No change in bowel habits.  Urinating well.  No change in color of urine.  No hearing changes, neuropathy noted.  Rest of the pertinent 10 point ROS reviewed and negative.  MEDICAL HISTORY:  Past Medical History:  Diagnosis Date  . Chest pain    2021   . History of kidney stones   . Hypertension   . Nasopharyngeal cancer (HCC)   . Pneumonia   . Sleep apnea     SURGICAL HISTORY: Past Surgical History:  Procedure Laterality Date  . FINE NEEDLE ASPIRATION BIOPSY    . HERNIA REPAIR     Umbilicatl hernia  . IR IMAGING GUIDED PORT INSERTION  10/18/2020  . LAPAROSCOPIC INSERTION GASTROSTOMY TUBE N/A 10/23/2020   Procedure: LAPAROSCOPIC ASSISTED PEG TUBE;  Surgeon: Fritzi MandesAllen, Shelby L, MD;  Location: [facility];  Service: General;  Laterality: N/A;  62  . ROTATOR CUFF REPAIR    . Torn Labrum      SOCIAL HISTORY: Social History   Socioeconomic History  . Marital status: Married    Spouse name: Not on file  . Number of children: Not on file  . Years of education: Not on file  . Highest education level: Not on file  Occupational History  . Not on file  Tobacco Use  . Smoking status: Never Smoker  . Smokeless tobacco: Never Used  Vaping Use  . Vaping Use: Never used  Substance and Sexual Activity  . Alcohol use: Not Currently    Comment: very rarely  . Drug use: No  . Sexual activity: Not Currently  Other Topics Concern  . Not on file  Social History Narrative  . Not on file   Social Determinants of Health   Financial Resource Strain: Not on file  Food Insecurity: No Food Insecurity  . Worried About Charity fundraiser in the Last Year: Never true  . Ran Out of Food in the Last Year: Never true  Transportation Needs: No Transportation Needs  . Lack of Transportation (Medical): No  . Lack of Transportation (Non-Medical): No  Physical Activity: Not on file  Stress: No Stress Concern Present  . Feeling of Stress : Not at all  Social Connections: Socially Integrated  . Frequency of Communication with Friends and Family: More than three times a week  . Frequency of Social Gatherings with Friends and Family: Twice a week  . Attends Religious Services: More than 4 times per year  . Active Member of Clubs or Organizations: Yes  .  Attends Archivist Meetings: 1 to 4 times per year  . Marital Status: Married  Human resources officer Violence: Not on file    FAMILY HISTORY: No family history on file.  ALLERGIES:  has No Known Allergies.  MEDICATIONS:  Current Outpatient Medications  Medication Sig Dispense Refill  . amLODipine (NORVASC) 10 MG tablet Take 10 mg by mouth daily.    Marland Kitchen atorvastatin (LIPITOR) 80 MG tablet Take 80 mg by mouth daily.    . cetirizine (ZYRTEC) 10 MG tablet Take 10 mg by mouth daily.    Marland Kitchen dexamethasone (DECADRON) 4 MG tablet Take 2 tablets (8 mg total) by mouth daily. Take daily x 3 days starting the day after cisplatin chemotherapy. Take with food. 30 tablet 1  . docusate sodium (COLACE) 100 MG capsule Take 1 capsule (100 mg total) by mouth 2 (two) times daily. 60 capsule 0  . ergocalciferol (VITAMIN D2) 1.25 MG (50000 UT) capsule Take 50,000 Units by mouth once a week. Wednesday    . HYDROcodone-acetaminophen (HYCET) 7.5-325 mg/15 ml solution Take 10 mLs by mouth every 8 (eight) hours as needed for moderate pain. 473 mL 0  . lidocaine (XYLOCAINE) 2 % solution Patient: Mix 1part 2% viscous lidocaine, 1part H20. Swish & swallow 76mL of diluted mixture, 60min before meals and at bedtime, up to QID 200 mL 3  . lidocaine-prilocaine (EMLA) cream Apply to affected area once 30 g 3  . LORazepam (ATIVAN) 0.5 MG tablet Take 1 tablet (0.5 mg total) by mouth every 6 (six) hours as needed (Nausea or vomiting). 30 tablet 0  . Nutritional Supplements (FEEDING SUPPLEMENT, OSMOLITE 1.5 CAL,) LIQD Begin 1 carton Osmolite 1.5 via G-tube 4 times daily with 60 mL free water before and after bolus feeding.  Increase to 1 carton Osmolite 1.5 at 1 feeding and 2 cartons Osmolite 1.5 at 3 feedings for a total of 7 cartons daily.  Flush or drink additional 240 mL free water 3 times daily.  Provides 2485 cal, 104 g protein, 2467 mL free water. (100% calorie needs, 95% protein needs) 1659 mL 0  . omeprazole (PRILOSEC) 20  MG capsule Take 20 mg by mouth daily.    . ondansetron (ZOFRAN) 8 MG tablet Take 1 tablet (8 mg total) by mouth 2 (two) times daily as needed. Start on the third day after cisplatin chemotherapy. 30 tablet 1  . prochlorperazine (COMPAZINE) 10 MG tablet Take 1 tablet (10 mg total) by mouth every 6 (six) hours as needed (Nausea or vomiting). 30 tablet 1  . sildenafil (VIAGRA) 50 MG  tablet Take 50-100 mg by mouth daily as needed for erectile dysfunction.    . nitroGLYCERIN (NITROSTAT) 0.4 MG SL tablet Place 0.4 mg under the tongue every 5 (five) minutes x 3 doses as needed for chest pain.     No current facility-administered medications for this visit.      PHYSICAL EXAMINATION: ECOG PERFORMANCE STATUS: 1 - Symptomatic but completely ambulatory  Vitals:   11/13/20 0944  BP: 131/86  Pulse: 77  Resp: 20  Temp: 97.6 F (36.4 C)  SpO2: 97%   Filed Weights   11/13/20 0944  Weight: 182 lb 11.2 oz (82.9 kg)    Physical Exam Constitutional:      Appearance: Normal appearance.  HENT:     Head: Normocephalic and atraumatic.     Mouth/Throat:     Pharynx: Oropharyngeal exudate and posterior oropharyngeal erythema present.     Comments: Severe mucositis noted on the roof of the mouth Eyes:     Extraocular Movements: Extraocular movements intact.  Cardiovascular:     Rate and Rhythm: Normal rate and regular rhythm.     Pulses: Normal pulses.     Heart sounds: Normal heart sounds.  Pulmonary:     Effort: Pulmonary effort is normal.     Breath sounds: Normal breath sounds.  Abdominal:     General: Abdomen is flat. Bowel sounds are normal.     Palpations: Abdomen is soft.     Comments: G-tube appears well  Musculoskeletal:        General: Normal range of motion.  Lymphadenopathy:     Cervical: Cervical adenopathy (Improving compared to last visit consistent with response) present.  Skin:    General: Skin is warm and dry.  Neurological:     General: No focal deficit present.      Mental Status: He is alert.  Psychiatric:        Mood and Affect: Mood normal.        Behavior: Behavior normal.      LABORATORY DATA:  I have reviewed the data as listed Lab Results  Component Value Date   WBC 4.0 11/13/2020   HGB 13.5 11/13/2020   HCT 41.4 11/13/2020   MCV 88.8 11/13/2020   PLT 202 11/13/2020     Chemistry      Component Value Date/Time   NA 138 11/13/2020 0902   K 4.0 11/13/2020 0902   CL 98 11/13/2020 0902   CO2 31 11/13/2020 0902   BUN 16 11/13/2020 0902   CREATININE 1.34 (H) 11/13/2020 0902      Component Value Date/Time   CALCIUM 8.9 11/13/2020 0902   ALKPHOS 77 11/13/2020 0902   AST 12 (L) 11/13/2020 0902   ALT 23 11/13/2020 0902   BILITOT 0.6 11/13/2020 0902       RADIOGRAPHIC STUDIES: I have personally reviewed the radiological images as listed and agreed with the findings in the report. IR IMAGING GUIDED PORT INSERTION  Result Date: 10/18/2020 INDICATION: 65 year old male with head and neck cancer and locoregional metastatic lymphadenopathy. He presents for port catheter placement for durable venous access. EXAM: IMPLANTED PORT A CATH PLACEMENT WITH ULTRASOUND AND FLUOROSCOPIC GUIDANCE MEDICATIONS: None ANESTHESIA/SEDATION: Versed 1 mg IV; Fentanyl 50 mcg IV; Moderate Sedation Time:  23 minutes The patient was continuously monitored during the procedure by the interventional radiology nurse under my direct supervision. FLUOROSCOPY TIME:  0 minutes, 48 seconds (3 mGy) COMPLICATIONS: None immediate. PROCEDURE: The right neck and chest was prepped with chlorhexidine, and draped  in the usual sterile fashion using maximum barrier technique (cap and mask, sterile gown, sterile gloves, large sterile sheet, hand hygiene and cutaneous antiseptic). Local anesthesia was attained by infiltration with 1% lidocaine with epinephrine. Ultrasound demonstrated patency of the left internal jugular vein vein, and this was documented with an image. Under real-time  ultrasound guidance, this vein was accessed with a 21 gauge micropuncture needle and image documentation was performed. A small dermatotomy was made at the access site with an 11 scalpel. A 0.018" wire was advanced into the SVC and the access needle exchanged for a 21F micropuncture vascular sheath. The 0.018" wire was then removed and a 0.035" wire advanced into the IVC. An appropriate location for the subcutaneous reservoir was selected below the clavicle and an incision was made through the skin and underlying soft tissues. The subcutaneous tissues were then dissected using a combination of blunt and sharp surgical technique and a pocket was formed. A single lumen power injectable portacatheter was then tunneled through the subcutaneous tissues from the pocket to the dermatotomy and the port reservoir placed within the subcutaneous pocket. The venous access site was then serially dilated and a peel away vascular sheath placed over the wire. The wire was removed and the port catheter advanced into position under fluoroscopic guidance. The catheter tip is positioned in the superior cavoatrial junction. This was documented with a spot image. The portacatheter was then tested and found to flush and aspirate well. The port was flushed with saline followed by 100 units/mL heparinized saline. The pocket was then closed in two layers using first subdermal inverted interrupted absorbable sutures followed by a running subcuticular suture. The epidermis was then sealed with Dermabond. The dermatotomy at the venous access site was also closed with Dermabond. IMPRESSION: Successful placement of a left IJ approach Power Port with ultrasound and fluoroscopic guidance. The catheter is ready for use. Electronically Signed   By: Jacqulynn Cadet M.D.   On: 10/18/2020 10:39    All questions were answered. The patient knows to call the clinic with any problems, questions or concerns.      Benay Pike, MD 11/13/2020 1:10  PM

## 2020-11-13 NOTE — Assessment & Plan Note (Signed)
He does complain of some nausea and anxiety with ongoing treatment.  He has been taking Ativan as needed for management of nausea.  Recommended that he can try Ativan every 8 hours as needed for management of anxiety but for nausea he also has other options.  He expressed understanding.

## 2020-11-13 NOTE — Assessment & Plan Note (Signed)
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 Since he is EBV negative, there is no clear role of induction. We discussed role of concurrent chemoradiation followed by adjuvant chemotherapy. He was very anxious about bolus cisplatin dosing. He is now on weekly cisplatin, skipped planned dose of cisplatin first week because of conflicting appointments despite offer to reschedule non chemo appointments to another date. He started C1D1 on 10/31/2020.  ROS, severe fatigue, mild nausea and constiption. Encouraged using G tube for nutrition,I think most of his fatigue is because of poor intake besides ongoing treatment. We discussed about dose reduction but patient would like to try one more treatment with full dose. Lab reviewed, satisfactory to proceed.   

## 2020-11-13 NOTE — Assessment & Plan Note (Signed)
Again encouraged use of G-tube.  He has only been eating food and drinking 1-2 ensures a day.  He has Osmolite for G-tube supplementation.  Encouraged him to start using it and follow-up with nutrition.

## 2020-11-14 ENCOUNTER — Ambulatory Visit
Admission: RE | Admit: 2020-11-14 | Discharge: 2020-11-14 | Disposition: A | Discharge status: Home / Self Care | Payer: 59 | Source: Ambulatory Visit | Attending: Radiation Oncology | Admitting: Radiation Oncology

## 2020-11-14 ENCOUNTER — Other Ambulatory Visit (HOSPITAL_COMMUNITY): Payer: Self-pay

## 2020-11-14 ENCOUNTER — Inpatient Hospital Stay: Payer: 59

## 2020-11-14 ENCOUNTER — Inpatient Hospital Stay: Payer: 59 | Admitting: Nutrition

## 2020-11-14 ENCOUNTER — Inpatient Hospital Stay: Payer: 59 | Admitting: General Practice

## 2020-11-14 VITALS — BP 105/65 | HR 70 | Temp 99.2°F | Resp 18

## 2020-11-14 DIAGNOSIS — Z5111 Encounter for antineoplastic chemotherapy: Secondary | ICD-10-CM | POA: Diagnosis not present

## 2020-11-14 DIAGNOSIS — R634 Abnormal weight loss: Secondary | ICD-10-CM | POA: Diagnosis not present

## 2020-11-14 DIAGNOSIS — C119 Malignant neoplasm of nasopharynx, unspecified: Secondary | ICD-10-CM

## 2020-11-14 MED ORDER — SODIUM CHLORIDE 0.9 % IV SOLN
10.0000 mg | Freq: Once | INTRAVENOUS | Status: AC
  Administered 2020-11-14: 10 mg via INTRAVENOUS
  Filled 2020-11-14: qty 10

## 2020-11-14 MED ORDER — POTASSIUM CHLORIDE IN NACL 20-0.9 MEQ/L-% IV SOLN
Freq: Once | INTRAVENOUS | Status: AC
Start: 2020-11-14 — End: 2020-11-14
  Filled 2020-11-14: qty 1000

## 2020-11-14 MED ORDER — SODIUM CHLORIDE 0.9 % IV SOLN
Freq: Once | INTRAVENOUS | Status: AC
Start: 2020-11-14 — End: 2020-11-14
  Filled 2020-11-14: qty 250

## 2020-11-14 MED ORDER — HEPARIN SOD (PORK) LOCK FLUSH 100 UNIT/ML IV SOLN
500.0000 [IU] | Freq: Once | INTRAVENOUS | Status: AC | PRN
  Administered 2020-11-14: 500 [IU]
  Filled 2020-11-14: qty 5

## 2020-11-14 MED ORDER — PALONOSETRON HCL INJECTION 0.25 MG/5ML
0.2500 mg | Freq: Once | INTRAVENOUS | Status: AC
  Administered 2020-11-14: 0.25 mg via INTRAVENOUS

## 2020-11-14 MED ORDER — SODIUM CHLORIDE 0.9 % IV SOLN
40.0000 mg/m2 | Freq: Once | INTRAVENOUS | Status: AC
  Administered 2020-11-14: 81 mg via INTRAVENOUS
  Filled 2020-11-14: qty 81

## 2020-11-14 MED ORDER — SODIUM CHLORIDE 0.9 % IV SOLN
150.0000 mg | Freq: Once | INTRAVENOUS | Status: AC
  Administered 2020-11-14: 150 mg via INTRAVENOUS
  Filled 2020-11-14: qty 150

## 2020-11-14 MED ORDER — MAGNESIUM SULFATE 2 GM/50ML IV SOLN
2.0000 g | Freq: Once | INTRAVENOUS | Status: AC
Start: 2020-11-14 — End: 2020-11-14
  Administered 2020-11-14: 2 g via INTRAVENOUS

## 2020-11-14 MED ORDER — SODIUM CHLORIDE 0.9% FLUSH
10.0000 mL | INTRAVENOUS | Status: DC | PRN
  Administered 2020-11-14: 10 mL
  Filled 2020-11-14: qty 10

## 2020-11-14 MED ORDER — MAGNESIUM SULFATE 2 GM/50ML IV SOLN
INTRAVENOUS | Status: AC
  Filled 2020-11-14: qty 50

## 2020-11-14 MED ORDER — PALONOSETRON HCL INJECTION 0.25 MG/5ML
INTRAVENOUS | Status: AC
  Filled 2020-11-14: qty 5

## 2020-11-14 NOTE — Progress Notes (Signed)
Nutrition follow up completed with patient during infusion.  Patient is receiving concurrent chemoradiation therapy for nasopharyngeal cancer of the soft palate. Weight decreased to 182.7 pounds May 11 from 184.8 pounds May 4. Patient reports he is eating okay. He tolerates soft foods.  Reports lack of taste makes it difficult to eat enough. He does try to eat small amounts.  He denies constipation and diarrhea. Reports nausea is controlled best with ativan. Severe Mucositis noted upon MD exam. Hydrocodone ordered. Patient has not tried G-tube feedings yet.  He states he thinks he is eating enough and wants to continue to eat by mouth is much as possible.  Estimated nutrition needs: 2400-2600 cal, 110-126 g protein, 2.6 L fluid.  Nutrition diagnosis: Food and nutrition related knowledge deficit continues.  Intervention: Educated on importance of adequate nutrition throughout treatment.  Even though weight is only down 2 pounds, encourage patient to try 1 carton Osmolite 1.5 via feeding tube every day.  He was willing to demonstrate giving 1 carton via G-tube today and he did so with good results and successful infusion. Support patient increasing oral intake as tolerated but expressed hope he would increase oral nutrition supplements or tube feeding to minimize further weight loss.  Patient has a tube feeding schedule which was provided at last visit.  He has no questions or concerns.  Monitoring, evaluation, goals: Patient will continue to tolerate adequate calories and protein intake to minimize weight loss.  Next visit: Thursday, May 19 during infusion.  **Disclaimer: This note was dictated with voice recognition software. Similar sounding words can inadvertently be transcribed and this note may contain transcription errors which may not have been corrected upon publication of note.**

## 2020-11-14 NOTE — Progress Notes (Signed)
[place] CSW Progress Notes  Met w patient in infusion, he requests that CSW submit his [employer] disability transportation application - CSW submitted via secure email.  Advised him to follow up w agency in two weeks.  Edwyna Shell, LCSW Clinical Social Worker Phone:  [phone]

## 2020-11-14 NOTE — Patient Instructions (Signed)
[facility]  Discharge Instructions: Thank you for choosing [facility] to provide your oncology and hematology care.   If you have a lab appointment with the Cancer Center, please go directly to the Cancer Center and check in at the registration area.   Wear comfortable clothing and clothing appropriate for easy access to any Portacath or PICC line.   We strive to give you quality time with your provider. You may need to reschedule your appointment if you arrive late (15 or more minutes).  Arriving late affects you and other patients whose appointments are after yours.  Also, if you miss three or more appointments without notifying the office, you may be dismissed from the clinic at the provider's discretion.      For prescription refill requests, have your pharmacy contact our office and allow 72 hours for refills to be completed.    Today you received the following chemotherapy and/or immunotherapy agents cisplatin   To help prevent nausea and vomiting after your treatment, we encourage you to take your nausea medication as directed.  BELOW ARE SYMPTOMS THAT SHOULD BE REPORTED IMMEDIATELY: *FEVER GREATER THAN 100.4 F (38 C) OR HIGHER *CHILLS OR SWEATING *NAUSEA AND VOMITING THAT IS NOT CONTROLLED WITH YOUR NAUSEA MEDICATION *UNUSUAL SHORTNESS OF BREATH *UNUSUAL BRUISING OR BLEEDING *URINARY PROBLEMS (pain or burning when urinating, or frequent urination) *BOWEL PROBLEMS (unusual diarrhea, constipation, pain near the anus) TENDERNESS IN MOUTH AND THROAT WITH OR WITHOUT PRESENCE OF ULCERS (sore throat, sores in mouth, or a toothache) UNUSUAL RASH, SWELLING OR PAIN  UNUSUAL VAGINAL DISCHARGE OR ITCHING   Items with * indicate a potential emergency and should be followed up as soon as possible or go to the Emergency Department if any problems should occur.  Please show the CHEMOTHERAPY ALERT CARD or IMMUNOTHERAPY ALERT CARD at check-in to the  Emergency Department and triage nurse.  Should you have questions after your visit or need to cancel or reschedule your appointment, please contact [facility]  Dept: [phone]  and follow the prompts.  Office hours are 8:00 a.m. to 4:30 p.m. Monday - Friday. Please note that voicemails left after 4:00 p.m. may not be returned until the following business day.  We are closed weekends and major holidays. You have access to a nurse at all times for urgent questions. Please call the main number to the clinic Dept: [phone] and follow the prompts.   For any non-urgent questions, you may also contact your provider using MyChart. We now offer e-Visits for anyone 18 and older to request care online for non-urgent symptoms. For details visit [email or web address].   Also download the MyChart app! Go to the app store, search "MyChart", open the app, select [facility], and log in with your MyChart username and password.  Due to Covid, a mask is required upon entering the hospital/clinic. If you do not have a mask, one will be given to you upon arrival. For doctor visits, patients may have 1 support person aged 18 or older with them. For treatment visits, patients cannot have anyone with them due to current Covid guidelines and our immunocompromised population.   

## 2020-11-15 ENCOUNTER — Ambulatory Visit
Admission: RE | Admit: 2020-11-15 | Discharge: 2020-11-15 | Disposition: A | Discharge status: Home / Self Care | Payer: 59 | Source: Ambulatory Visit | Attending: Radiation Oncology | Admitting: Radiation Oncology

## 2020-11-15 DIAGNOSIS — R634 Abnormal weight loss: Secondary | ICD-10-CM | POA: Diagnosis not present

## 2020-11-18 ENCOUNTER — Ambulatory Visit
Admission: RE | Admit: 2020-11-18 | Discharge: 2020-11-18 | Disposition: A | Discharge status: Home / Self Care | Payer: 59 | Source: Ambulatory Visit | Attending: Radiation Oncology | Admitting: Radiation Oncology

## 2020-11-18 DIAGNOSIS — R634 Abnormal weight loss: Secondary | ICD-10-CM | POA: Diagnosis not present

## 2020-11-19 ENCOUNTER — Ambulatory Visit
Admission: RE | Admit: 2020-11-19 | Discharge: 2020-11-19 | Disposition: A | Discharge status: Home / Self Care | Payer: 59 | Source: Ambulatory Visit | Attending: Radiation Oncology | Admitting: Radiation Oncology

## 2020-11-19 DIAGNOSIS — R634 Abnormal weight loss: Secondary | ICD-10-CM | POA: Diagnosis not present

## 2020-11-20 ENCOUNTER — Ambulatory Visit
Admission: RE | Admit: 2020-11-20 | Discharge: 2020-11-20 | Disposition: A | Discharge status: Home / Self Care | Payer: 59 | Source: Ambulatory Visit | Attending: Radiation Oncology | Admitting: Radiation Oncology

## 2020-11-20 ENCOUNTER — Inpatient Hospital Stay: Payer: 59

## 2020-11-20 ENCOUNTER — Encounter: Payer: Self-pay | Admitting: Hematology and Oncology

## 2020-11-20 ENCOUNTER — Other Ambulatory Visit: Payer: Self-pay

## 2020-11-20 ENCOUNTER — Other Ambulatory Visit: Payer: 59

## 2020-11-20 ENCOUNTER — Inpatient Hospital Stay (HOSPITAL_BASED_OUTPATIENT_CLINIC_OR_DEPARTMENT_OTHER): Payer: 59 | Admitting: Hematology and Oncology

## 2020-11-20 DIAGNOSIS — K1231 Oral mucositis (ulcerative) due to antineoplastic therapy: Secondary | ICD-10-CM | POA: Diagnosis not present

## 2020-11-20 DIAGNOSIS — R112 Nausea with vomiting, unspecified: Secondary | ICD-10-CM

## 2020-11-20 DIAGNOSIS — C119 Malignant neoplasm of nasopharynx, unspecified: Secondary | ICD-10-CM

## 2020-11-20 DIAGNOSIS — C113 Malignant neoplasm of anterior wall of nasopharynx: Secondary | ICD-10-CM

## 2020-11-20 DIAGNOSIS — T451X5A Adverse effect of antineoplastic and immunosuppressive drugs, initial encounter: Secondary | ICD-10-CM

## 2020-11-20 DIAGNOSIS — T451X5D Adverse effect of antineoplastic and immunosuppressive drugs, subsequent encounter: Secondary | ICD-10-CM | POA: Diagnosis not present

## 2020-11-20 DIAGNOSIS — Z5111 Encounter for antineoplastic chemotherapy: Secondary | ICD-10-CM | POA: Diagnosis not present

## 2020-11-20 DIAGNOSIS — Z95828 Presence of other vascular implants and grafts: Secondary | ICD-10-CM

## 2020-11-20 DIAGNOSIS — R634 Abnormal weight loss: Secondary | ICD-10-CM | POA: Diagnosis not present

## 2020-11-20 LAB — CBC WITH DIFFERENTIAL (CANCER CENTER ONLY)
Abs Immature Granulocytes: 0.02 10*3/uL (ref 0.00–0.07)
Basophils Absolute: 0 10*3/uL (ref 0.0–0.1)
Basophils Relative: 0 %
Eosinophils Absolute: 0 10*3/uL (ref 0.0–0.5)
Eosinophils Relative: 1 %
HCT: 37.8 % — ABNORMAL LOW (ref 39.0–52.0)
Hemoglobin: 12.2 g/dL — ABNORMAL LOW (ref 13.0–17.0)
Immature Granulocytes: 1 %
Lymphocytes Relative: 3 %
Lymphs Abs: 0.1 10*3/uL — ABNORMAL LOW (ref 0.7–4.0)
MCH: 28.9 pg (ref 26.0–34.0)
MCHC: 32.3 g/dL (ref 30.0–36.0)
MCV: 89.6 fL (ref 80.0–100.0)
Monocytes Absolute: 0.2 10*3/uL (ref 0.1–1.0)
Monocytes Relative: 6 %
Neutro Abs: 3.1 10*3/uL (ref 1.7–7.7)
Neutrophils Relative %: 89 %
Platelet Count: 140 10*3/uL — ABNORMAL LOW (ref 150–400)
RBC: 4.22 MIL/uL (ref 4.22–5.81)
RDW: 15.3 % (ref 11.5–15.5)
WBC Count: 3.5 10*3/uL — ABNORMAL LOW (ref 4.0–10.5)
nRBC: 0 % (ref 0.0–0.2)

## 2020-11-20 LAB — CMP (CANCER CENTER ONLY)
ALT: 17 U/L (ref 0–44)
AST: 15 U/L (ref 15–41)
Albumin: 3.2 g/dL — ABNORMAL LOW (ref 3.5–5.0)
Alkaline Phosphatase: 73 U/L (ref 38–126)
Anion gap: 10 (ref 5–15)
BUN: 18 mg/dL (ref 8–23)
CO2: 30 mmol/L (ref 22–32)
Calcium: 8.9 mg/dL (ref 8.9–10.3)
Chloride: 99 mmol/L (ref 98–111)
Creatinine: 1.2 mg/dL (ref 0.61–1.24)
GFR, Estimated: >60 mL/min (ref >60)
Glucose, Bld: 118 mg/dL — ABNORMAL HIGH (ref 70–99)
Potassium: 3.7 mmol/L (ref 3.5–5.1)
Sodium: 139 mmol/L (ref 135–145)
Total Bilirubin: 0.6 mg/dL (ref 0.3–1.2)
Total Protein: 6.9 g/dL (ref 6.5–8.1)

## 2020-11-20 LAB — MAGNESIUM: Magnesium: 1.8 mg/dL (ref 1.7–2.4)

## 2020-11-20 MED ORDER — SODIUM CHLORIDE 0.9% FLUSH
10.0000 mL | Freq: Once | INTRAVENOUS | Status: DC
  Filled 2020-11-20: qty 10

## 2020-11-20 NOTE — Progress Notes (Signed)
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 ROS, dysguesia, some weight loss, sore throat. PE improving lymphadenopathy, mucositis noted, otherwise doing well. Encouraged using G tube for nutrition along with po intake. Lab reviewed, satisfactory to proceed. We also discussed about adjuvant chemotherapy today, he will think about it.   Mucositis due to antineoplastic therapy Moderate secondary to chemotherapy and radiation Ok to continue baking soda rinses and lidocaine. He didn't feel like additional medication is needed at this time.  Chemotherapy induced nausea and vomiting Ok to use ativan for nausea, patient says this works well for him He also has prescription zofran and compazine.  No orders of the defined types were placed in this encounter.    HISTORY OF PRESENTING ILLNESS:  Alan Leech Sr. 65 y.o. male is here because of SCC of nasopharyngeal origin, EBV neg.  Oncology History Overview Note  Alan Dearment. is a 65 y.o. male who presented with six-month history of gradually enlarging bilateral lymph nodes with associated mild discomfort and pressure.   Subsequently, the patient saw Dr. Wilburn Cornelia, who recommended CT scan of neck and ultrasound-guided needle core biopsy for soft tissue diagnosis.   Biopsy of right neck lymph node on 08/30/2020 revealed: squamous cell carcinoma, p16 positive. EBV  ordered, negative.  He had CT soft tissue neck done on August 07, 2020 which showed nasopharyngeal soft tissue prominence, greatest soft tissue effacement of adjacent right parapharyngeal fat suspected to be at least enlarged right retropharyngeal lymph node.  Bulky bilateral cervical midline likely right intraparotid lymphadenopathy  PET/CT scan shows intense FDG uptake with area of increased soft tissue fullness in the posterior nasopharynx.  Extensive bulky bilateral FDG avid cervical adenopathy.  Large FDG avid lymph node also identified within the right parotid gland.  Imaging findings compatible with metastatic adenopathy.  Subcentimeter right supraclavicular lymph node exhibits mild FDG uptake of low background activity equivocal for nodal metastasis.  No additional signs of metastatic disease.  He had  right neck lymph node biopsy which is positive for squamous cell carcinoma, p16 positive, EBV negative  During his initial visit, we recommended concurrent CRT followed by adjuvant chemotherapy. We discussed about every 21 day cisplatin, but he was very reluctant to proceed with every 21 days cisplatin, hence now on weekly cisplatin.    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[place])  10/04/2020 Initial Diagnosis   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[place])   10/04/2020 Cancer Staging   Staging form: Pharynx - Nasopharynx, AJCC 8th Edition - Clinical stage from 10/04/2020: Stage IVA (cT1, cN3, cM0) - Signed by Eppie Gibson, MD on 10/04/2020 Stage prefix: Initial diagnosis   Nasopharynx cancer ([place])  10/08/2020 Initial Diagnosis   Nasopharynx cancer ([place])   10/31/2020 -  Chemotherapy    Patient is on Treatment Plan: HEAD/NECK CISPLATIN Q7D + XRT X 6 CYCLES / CISPLATIN D1 + 5FU IVCI D1-4 Q28D X 3 CYCLES        INTERIM HISTORY  Alan Henry is here for  follow up prior to C4 of chemotherapy with weekly cisplatin. He is doing well, cant taste food but still able to swallow. His mouth hurts but  baking soda rinses have been helping He has been trying to use the G tube, trying to use 1 can of osmolite daily. No diarrhea, had to go see ENT for some pain in right ear, thought to have abrasion of ear drum. No change in breathing, urinary habits. No fevers, chills No hearing loss Neck mass responding well. Rest of the pertinent 10 point ROS reviewed and negative.  MEDICAL HISTORY:  Past Medical History:  Diagnosis Date  . Chest pain    2021  . History of kidney stones   . Hypertension   . Nasopharyngeal cancer ([place])   . Pneumonia   . Sleep apnea     SURGICAL HISTORY: Past Surgical History:  Procedure Laterality Date  . FINE NEEDLE ASPIRATION BIOPSY    . HERNIA REPAIR     Umbilicatl hernia  . IR IMAGING GUIDED PORT INSERTION  10/18/2020  . LAPAROSCOPIC INSERTION GASTROSTOMY TUBE N/A 10/23/2020   Procedure: LAPAROSCOPIC ASSISTED PEG TUBE;  Surgeon: Dwan Bolt, MD;  Location: [facility]S;  Service: General;  Laterality: N/A;  77  . ROTATOR CUFF REPAIR    . Torn Labrum      SOCIAL HISTORY: Social History   Socioeconomic History  . Marital status: Married    Spouse name: Not on file  . Number of children: Not on file  . Years of education: Not on file  . Highest education level: Not on file  Occupational History  . Not on file  Tobacco Use  . Smoking status: Never Smoker  . Smokeless tobacco: Never Used  Vaping Use  . Vaping Use: Never used  Substance and Sexual Activity  . Alcohol use: Not Currently    Comment: very rarely  . Drug use: No  . Sexual activity: Not Currently  Other Topics Concern  . Not on file  Social History Narrative  . Not on file   Social Determinants of Health   Financial Resource Strain: Not on file  Food Insecurity: No Food Insecurity  . Worried About Charity fundraiser in the Last Year: Never true  . Ran Out of Food in the Last Year: Never true  Transportation Needs: No Transportation Needs  . Lack of Transportation (Medical):  No  . Lack of Transportation (Non-Medical): No  Physical Activity: Not on file  Stress: No Stress Concern Present  . Feeling of Stress : Not at all  Social Connections: Socially Integrated  . Frequency of Communication with Friends and Family: More than three times a week  . Frequency of Social Gatherings with Friends and Family: Twice a week  . Attends Religious Services: More than 4 times per year  . Active Member of Clubs or Organizations: Yes  . Attends Archivist Meetings: 1 to 4 times per year  . Marital Status: Married  Human resources officer Violence: Not on file    FAMILY HISTORY: No family history on file.  ALLERGIES:  has No Known Allergies.  MEDICATIONS:  Current Outpatient Medications  Medication Sig Dispense Refill  . amLODipine (NORVASC) 10 MG tablet Take 10 mg by mouth daily.    Marland Kitchen atorvastatin (LIPITOR) 80 MG tablet Take 80 mg by mouth daily.    . cetirizine (ZYRTEC) 10 MG tablet Take 10 mg by mouth daily.    Marland Kitchen dexamethasone (DECADRON) 4 MG tablet Take 2 tablets (  8 mg total) by mouth daily. Take daily x 3 days starting the day after cisplatin chemotherapy. Take with food. 30 tablet 1  . docusate sodium (COLACE) 100 MG capsule Take 1 capsule (100 mg total) by mouth 2 (two) times daily. 60 capsule 0  . ergocalciferol (VITAMIN D2) 1.25 MG (50000 UT) capsule Take 50,000 Units by mouth once a week. Wednesday    . HYDROcodone-acetaminophen (HYCET) 7.5-325 mg/15 ml solution Take 10 mLs by mouth every 8 (eight) hours as needed for moderate pain. 473 mL 0  . lidocaine (XYLOCAINE) 2 % solution Patient: Mix 1part 2% viscous lidocaine, 1part H20. Swish & swallow 76mL of diluted mixture, 81min before meals and at bedtime, up to QID 200 mL 3  . lidocaine-prilocaine (EMLA) cream Apply to affected area once 30 g 3  . LORazepam (ATIVAN) 0.5 MG tablet Take 1 tablet (0.5 mg total) by mouth every 6 (six) hours as needed (Nausea or vomiting). 30 tablet 0  . nitroGLYCERIN (NITROSTAT)  0.4 MG SL tablet Place 0.4 mg under the tongue every 5 (five) minutes x 3 doses as needed for chest pain.    . Nutritional Supplements (FEEDING SUPPLEMENT, OSMOLITE 1.5 CAL,) LIQD Begin 1 carton Osmolite 1.5 via G-tube 4 times daily with 60 mL free water before and after bolus feeding.  Increase to 1 carton Osmolite 1.5 at 1 feeding and 2 cartons Osmolite 1.5 at 3 feedings for a total of 7 cartons daily.  Flush or drink additional 240 mL free water 3 times daily.  Provides 2485 cal, 104 g protein, 2467 mL free water. (100% calorie needs, 95% protein needs) 1659 mL 0  . omeprazole (PRILOSEC) 20 MG capsule Take 20 mg by mouth daily.    . ondansetron (ZOFRAN) 8 MG tablet Take 1 tablet (8 mg total) by mouth 2 (two) times daily as needed. Start on the third day after cisplatin chemotherapy. 30 tablet 1  . prochlorperazine (COMPAZINE) 10 MG tablet Take 1 tablet (10 mg total) by mouth every 6 (six) hours as needed (Nausea or vomiting). 30 tablet 1  . sildenafil (VIAGRA) 50 MG tablet Take 50-100 mg by mouth daily as needed for erectile dysfunction.     No current facility-administered medications for this visit.    PHYSICAL EXAMINATION:  ECOG PERFORMANCE STATUS: 1 - Symptomatic but completely ambulatory  Vitals:   11/20/20 1131  BP: (!) 144/85  Pulse: 79  Resp: 18  Temp: 98.8 F (37.1 C)  SpO2: 100%   Filed Weights   11/20/20 1131  Weight: 180 lb (81.6 kg)    GENERAL:alert, no distress and comfortable SKIN: skin color, texture, turgor are normal, no rashes or significant lesions EYES: normal, conjunctiva are pink and non-injected, sclera clear OROPHARYNX mucositis noted roof of mouth and hard palate. NECK: supple, thyroid normal size,  LYMPH:  Bilateral large palpable cervical LN, left side with significant response, right sided LN still noted to be large but softer on palpation LUNGS: clear to auscultation and percussion with normal breathing effort HEART: regular rate & rhythm and no  murmurs and no lower extremity edema ABDOMEN:abdomen soft, non-tender and normal bowel sounds Musculoskeletal:no cyanosis of digits and no clubbing  PSYCH: alert & oriented x 3 with fluent speech NEURO: no focal motor/sensory deficits Port and PEG site clean  LABORATORY DATA:  I have reviewed the data as listed Lab Results  Component Value Date   WBC 3.5 (L) 11/20/2020   HGB 12.2 (L) 11/20/2020   HCT 37.8 (L) 11/20/2020  MCV 89.6 11/20/2020   PLT 140 (L) 11/20/2020     Chemistry      Component Value Date/Time   NA 139 11/20/2020 1039   K 3.7 11/20/2020 1039   CL 99 11/20/2020 1039   CO2 30 11/20/2020 1039   BUN 18 11/20/2020 1039   CREATININE 1.20 11/20/2020 1039      Component Value Date/Time   CALCIUM 8.9 11/20/2020 1039   ALKPHOS 73 11/20/2020 1039   AST 15 11/20/2020 1039   ALT 17 11/20/2020 1039   BILITOT 0.6 11/20/2020 1039     I have reviewed labs. Ok to proceed with planned chemotherapy  RADIOGRAPHIC STUDIES: I have personally reviewed the radiological images as listed and agreed with the findings in the report. No results found.  All questions were answered. The patient knows to call the clinic with any problems, questions or concerns.   Benay Pike, MD 11/20/2020 1:07 PM

## 2020-11-20 NOTE — Assessment & Plan Note (Signed)
Moderate secondary to chemotherapy and radiation Ok to continue baking soda rinses and lidocaine. He didn't feel like additional medication is needed at this time.

## 2020-11-20 NOTE — Assessment & Plan Note (Signed)
Ok to use ativan for nausea, patient says this works well for him He also has prescription zofran and compazine.

## 2020-11-20 NOTE — Assessment & Plan Note (Addendum)
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 ROS, dysguesia, some weight loss, sore throat. PE improving lymphadenopathy, mucositis noted, otherwise doing well. Encouraged using G tube for nutrition along with po intake. Lab reviewed, satisfactory to proceed. We also discussed about adjuvant chemotherapy today, he will think about it.

## 2020-11-21 ENCOUNTER — Other Ambulatory Visit: Payer: Self-pay

## 2020-11-21 ENCOUNTER — Inpatient Hospital Stay: Payer: 59

## 2020-11-21 ENCOUNTER — Ambulatory Visit
Admission: RE | Admit: 2020-11-21 | Discharge: 2020-11-21 | Disposition: A | Discharge status: Home / Self Care | Payer: 59 | Source: Ambulatory Visit | Attending: Radiation Oncology | Admitting: Radiation Oncology

## 2020-11-21 ENCOUNTER — Inpatient Hospital Stay: Payer: 59 | Admitting: Nutrition

## 2020-11-21 VITALS — BP 126/77 | HR 80 | Temp 98.7°F | Resp 18

## 2020-11-21 DIAGNOSIS — R634 Abnormal weight loss: Secondary | ICD-10-CM | POA: Diagnosis not present

## 2020-11-21 DIAGNOSIS — C119 Malignant neoplasm of nasopharynx, unspecified: Secondary | ICD-10-CM

## 2020-11-21 DIAGNOSIS — Z5111 Encounter for antineoplastic chemotherapy: Secondary | ICD-10-CM | POA: Diagnosis not present

## 2020-11-21 MED ORDER — MAGNESIUM SULFATE 2 GM/50ML IV SOLN
INTRAVENOUS | Status: AC
  Filled 2020-11-21: qty 50

## 2020-11-21 MED ORDER — SODIUM CHLORIDE 0.9 % IV SOLN
Freq: Once | INTRAVENOUS | Status: AC
  Filled 2020-11-21: qty 250

## 2020-11-21 MED ORDER — FOSAPREPITANT DIMEGLUMINE INJECTION 150 MG
150.0000 mg | Freq: Once | INTRAVENOUS | Status: AC
  Administered 2020-11-21: 150 mg via INTRAVENOUS
  Filled 2020-11-21: qty 150

## 2020-11-21 MED ORDER — PALONOSETRON HCL INJECTION 0.25 MG/5ML
INTRAVENOUS | Status: AC
  Filled 2020-11-21: qty 5

## 2020-11-21 MED ORDER — PALONOSETRON HCL INJECTION 0.25 MG/5ML
0.2500 mg | Freq: Once | INTRAVENOUS | Status: AC
  Administered 2020-11-21: 0.25 mg via INTRAVENOUS

## 2020-11-21 MED ORDER — POTASSIUM CHLORIDE IN NACL 20-0.9 MEQ/L-% IV SOLN
Freq: Once | INTRAVENOUS | Status: AC
Start: 2020-11-21 — End: 2020-11-21
  Filled 2020-11-21: qty 1000

## 2020-11-21 MED ORDER — SODIUM CHLORIDE 0.9 % IV SOLN
40.0000 mg/m2 | Freq: Once | INTRAVENOUS | Status: AC
  Administered 2020-11-21: 81 mg via INTRAVENOUS
  Filled 2020-11-21: qty 81

## 2020-11-21 MED ORDER — MAGNESIUM SULFATE 2 GM/50ML IV SOLN
2.0000 g | Freq: Once | INTRAVENOUS | Status: AC
  Administered 2020-11-21: 2 g via INTRAVENOUS

## 2020-11-21 MED ORDER — SODIUM CHLORIDE 0.9 % IV SOLN
10.0000 mg | Freq: Once | INTRAVENOUS | Status: AC
Start: 2020-11-21 — End: 2020-11-21
  Administered 2020-11-21: 10 mg via INTRAVENOUS
  Filled 2020-11-21: qty 10

## 2020-11-21 MED ORDER — HEPARIN SOD (PORK) LOCK FLUSH 100 UNIT/ML IV SOLN
500.0000 [IU] | Freq: Once | INTRAVENOUS | Status: AC | PRN
Start: 2020-11-21 — End: 2020-11-21
  Administered 2020-11-21: 500 [IU]
  Filled 2020-11-21: qty 5

## 2020-11-21 MED ORDER — SODIUM CHLORIDE 0.9% FLUSH
10.0000 mL | INTRAVENOUS | Status: DC | PRN
  Administered 2020-11-21: 10 mL
  Filled 2020-11-21: qty 10

## 2020-11-21 NOTE — Progress Notes (Signed)
Nutrition follow-up completed with patient during infusion for nasopharyngeal cancer of the soft palate. Weight decreased and documented as 180 pounds on May 18 decreased from 184.8 pounds on May 4. (3% weight loss in 2 weeks.) Labs reviewed: Glucose 118 and albumin 3.3. Patient reports he has lost most of his taste.   Reports his sore mouth improves as long as he is using baking soda and salt water rinses. He has some increased nausea since the last cycle of chemotherapy.  Reports good relief with lorazepam. He denies constipation or diarrhea. Reports he can eat some soft cooked beef, mashed potatoes, soft cooked vegetables, pancakes, and oral nutrition supplements.  He drinks 1 Ensure daily by mouth and uses 1 carton Osmolite 1.5 via feeding tube. Denies questions or concerns about giving tube feeding via PEG.  Estimated nutrition needs: 2400-2600 cal, 110-126 g protein, 2.6 L fluid.  TF Goal: 7 cartons of Osmolite 1.5 daily over 4 feedings with 60 mL free water before and after bolus. Drink of flush an additional 240 mL water daily. This provides 2485 calories, 104 g protein, 2467 mL free water. He has adequate supplies and formula from [facility].  Nutrition diagnosis: Food and nutrition related knowledge deficit continues.  Intervention: Recommended patient increase total oral nutrition/supplements/tube feedings to 4 cartons daily. He should continue to eat soft foods as tolerated. Continue antiemetics to control nausea. Continue baking soda and salt water rinses for sore mouth and thickened saliva. Teach back method used.  Monitoring, evaluation, goals: Patient will tolerate adequate calories and protein to minimize further weight loss.  Next visit: Telephone call to be scheduled.  **Disclaimer: This note was dictated with voice recognition software. Similar sounding words can inadvertently be transcribed and this note may contain transcription errors which may not have been  corrected upon publication of note.**

## 2020-11-21 NOTE — Patient Instructions (Signed)
[facility]  Discharge Instructions: Thank you for choosing [facility] to provide your oncology and hematology care.   If you have a lab appointment with the Cancer Center, please go directly to the Cancer Center and check in at the registration area.   Wear comfortable clothing and clothing appropriate for easy access to any Portacath or PICC line.   We strive to give you quality time with your provider. You may need to reschedule your appointment if you arrive late (15 or more minutes).  Arriving late affects you and other patients whose appointments are after yours.  Also, if you miss three or more appointments without notifying the office, you may be dismissed from the clinic at the provider's discretion.      For prescription refill requests, have your pharmacy contact our office and allow 72 hours for refills to be completed.    Today you received the following chemotherapy and/or immunotherapy agents cisplatin   To help prevent nausea and vomiting after your treatment, we encourage you to take your nausea medication as directed.  BELOW ARE SYMPTOMS THAT SHOULD BE REPORTED IMMEDIATELY: *FEVER GREATER THAN 100.4 F (38 C) OR HIGHER *CHILLS OR SWEATING *NAUSEA AND VOMITING THAT IS NOT CONTROLLED WITH YOUR NAUSEA MEDICATION *UNUSUAL SHORTNESS OF BREATH *UNUSUAL BRUISING OR BLEEDING *URINARY PROBLEMS (pain or burning when urinating, or frequent urination) *BOWEL PROBLEMS (unusual diarrhea, constipation, pain near the anus) TENDERNESS IN MOUTH AND THROAT WITH OR WITHOUT PRESENCE OF ULCERS (sore throat, sores in mouth, or a toothache) UNUSUAL RASH, SWELLING OR PAIN  UNUSUAL VAGINAL DISCHARGE OR ITCHING   Items with * indicate a potential emergency and should be followed up as soon as possible or go to the Emergency Department if any problems should occur.  Please show the CHEMOTHERAPY ALERT CARD or IMMUNOTHERAPY ALERT CARD at check-in to the  Emergency Department and triage nurse.  Should you have questions after your visit or need to cancel or reschedule your appointment, please contact [facility]  Dept: [phone]  and follow the prompts.  Office hours are 8:00 a.m. to 4:30 p.m. Monday - Friday. Please note that voicemails left after 4:00 p.m. may not be returned until the following business day.  We are closed weekends and major holidays. You have access to a nurse at all times for urgent questions. Please call the main number to the clinic Dept: [phone] and follow the prompts.   For any non-urgent questions, you may also contact your provider using MyChart. We now offer e-Visits for anyone 18 and older to request care online for non-urgent symptoms. For details visit [email or web address].   Also download the MyChart app! Go to the app store, search "MyChart", open the app, select [place], and log in with your MyChart username and password.  Due to Covid, a mask is required upon entering the hospital/clinic. If you do not have a mask, one will be given to you upon arrival. For doctor visits, patients may have 1 support person aged 18 or older with them. For treatment visits, patients cannot have anyone with them due to current Covid guidelines and our immunocompromised population.   

## 2020-11-22 ENCOUNTER — Ambulatory Visit
Admission: RE | Admit: 2020-11-22 | Discharge: 2020-11-22 | Disposition: A | Discharge status: Home / Self Care | Payer: 59 | Source: Ambulatory Visit | Attending: Radiation Oncology | Admitting: Radiation Oncology

## 2020-11-22 DIAGNOSIS — R634 Abnormal weight loss: Secondary | ICD-10-CM | POA: Diagnosis not present

## 2020-11-25 ENCOUNTER — Ambulatory Visit
Admission: RE | Admit: 2020-11-25 | Discharge: 2020-11-25 | Disposition: A | Discharge status: Home / Self Care | Payer: 59 | Source: Ambulatory Visit | Attending: Radiation Oncology | Admitting: Radiation Oncology

## 2020-11-25 DIAGNOSIS — R634 Abnormal weight loss: Secondary | ICD-10-CM | POA: Diagnosis not present

## 2020-11-26 ENCOUNTER — Ambulatory Visit
Admission: RE | Admit: 2020-11-26 | Discharge: 2020-11-26 | Disposition: A | Discharge status: Home / Self Care | Payer: 59 | Source: Ambulatory Visit | Attending: Radiation Oncology | Admitting: Radiation Oncology

## 2020-11-26 DIAGNOSIS — R634 Abnormal weight loss: Secondary | ICD-10-CM | POA: Diagnosis not present

## 2020-11-27 ENCOUNTER — Other Ambulatory Visit: Payer: 59

## 2020-11-27 ENCOUNTER — Other Ambulatory Visit: Payer: Self-pay

## 2020-11-27 ENCOUNTER — Inpatient Hospital Stay (HOSPITAL_BASED_OUTPATIENT_CLINIC_OR_DEPARTMENT_OTHER): Payer: 59 | Admitting: Hematology and Oncology

## 2020-11-27 ENCOUNTER — Inpatient Hospital Stay: Payer: 59

## 2020-11-27 ENCOUNTER — Encounter: Payer: Self-pay | Admitting: Hematology and Oncology

## 2020-11-27 ENCOUNTER — Ambulatory Visit
Admission: RE | Admit: 2020-11-27 | Discharge: 2020-11-27 | Disposition: A | Discharge status: Home / Self Care | Payer: 59 | Source: Ambulatory Visit | Attending: Radiation Oncology | Admitting: Radiation Oncology

## 2020-11-27 DIAGNOSIS — Z95828 Presence of other vascular implants and grafts: Secondary | ICD-10-CM

## 2020-11-27 DIAGNOSIS — T451X5A Adverse effect of antineoplastic and immunosuppressive drugs, initial encounter: Secondary | ICD-10-CM

## 2020-11-27 DIAGNOSIS — K1231 Oral mucositis (ulcerative) due to antineoplastic therapy: Secondary | ICD-10-CM | POA: Diagnosis not present

## 2020-11-27 DIAGNOSIS — C113 Malignant neoplasm of anterior wall of nasopharynx: Secondary | ICD-10-CM

## 2020-11-27 DIAGNOSIS — Z5111 Encounter for antineoplastic chemotherapy: Secondary | ICD-10-CM | POA: Diagnosis not present

## 2020-11-27 DIAGNOSIS — R634 Abnormal weight loss: Secondary | ICD-10-CM | POA: Diagnosis not present

## 2020-11-27 DIAGNOSIS — R112 Nausea with vomiting, unspecified: Secondary | ICD-10-CM

## 2020-11-27 DIAGNOSIS — D701 Agranulocytosis secondary to cancer chemotherapy: Secondary | ICD-10-CM | POA: Insufficient documentation

## 2020-11-27 DIAGNOSIS — C119 Malignant neoplasm of nasopharynx, unspecified: Secondary | ICD-10-CM

## 2020-11-27 DIAGNOSIS — T451X5D Adverse effect of antineoplastic and immunosuppressive drugs, subsequent encounter: Secondary | ICD-10-CM

## 2020-11-27 LAB — CMP (CANCER CENTER ONLY)
ALT: 14 U/L (ref 0–44)
AST: 11 U/L — ABNORMAL LOW (ref 15–41)
Albumin: 3.4 g/dL — ABNORMAL LOW (ref 3.5–5.0)
Alkaline Phosphatase: 84 U/L (ref 38–126)
Anion gap: 10 (ref 5–15)
BUN: 16 mg/dL (ref 8–23)
CO2: 29 mmol/L (ref 22–32)
Calcium: 9.1 mg/dL (ref 8.9–10.3)
Chloride: 98 mmol/L (ref 98–111)
Creatinine: 1.28 mg/dL — ABNORMAL HIGH (ref 0.61–1.24)
GFR, Estimated: >60 mL/min (ref >60)
Glucose, Bld: 103 mg/dL — ABNORMAL HIGH (ref 70–99)
Potassium: 3.6 mmol/L (ref 3.5–5.1)
Sodium: 137 mmol/L (ref 135–145)
Total Bilirubin: 0.7 mg/dL (ref 0.3–1.2)
Total Protein: 7.3 g/dL (ref 6.5–8.1)

## 2020-11-27 LAB — CBC WITH DIFFERENTIAL (CANCER CENTER ONLY)
Abs Immature Granulocytes: 0.01 10*3/uL (ref 0.00–0.07)
Basophils Absolute: 0 10*3/uL (ref 0.0–0.1)
Basophils Relative: 1 %
Eosinophils Absolute: 0 10*3/uL (ref 0.0–0.5)
Eosinophils Relative: 2 %
HCT: 38.8 % — ABNORMAL LOW (ref 39.0–52.0)
Hemoglobin: 13 g/dL (ref 13.0–17.0)
Immature Granulocytes: 1 %
Lymphocytes Relative: 6 %
Lymphs Abs: 0.1 10*3/uL — ABNORMAL LOW (ref 0.7–4.0)
MCH: 29.4 pg (ref 26.0–34.0)
MCHC: 33.5 g/dL (ref 30.0–36.0)
MCV: 87.8 fL (ref 80.0–100.0)
Monocytes Absolute: 0.1 10*3/uL (ref 0.1–1.0)
Monocytes Relative: 9 %
Neutro Abs: 1 10*3/uL — ABNORMAL LOW (ref 1.7–7.7)
Neutrophils Relative %: 81 %
Platelet Count: 100 10*3/uL — ABNORMAL LOW (ref 150–400)
RBC: 4.42 MIL/uL (ref 4.22–5.81)
RDW: 15.3 % (ref 11.5–15.5)
WBC Count: 1.3 10*3/uL — ABNORMAL LOW (ref 4.0–10.5)
nRBC: 0 % (ref 0.0–0.2)

## 2020-11-27 LAB — MAGNESIUM: Magnesium: 1.7 mg/dL (ref 1.7–2.4)

## 2020-11-27 MED ORDER — SODIUM CHLORIDE 0.9% FLUSH
10.0000 mL | Freq: Once | INTRAVENOUS | Status: AC
  Administered 2020-11-27: 10 mL
  Filled 2020-11-27: qty 10

## 2020-11-27 MED ORDER — HEPARIN SOD (PORK) LOCK FLUSH 100 UNIT/ML IV SOLN
500.0000 [IU] | Freq: Once | INTRAVENOUS | Status: AC
  Administered 2020-11-27: 500 [IU] via INTRAVENOUS
  Filled 2020-11-27: qty 5

## 2020-11-27 NOTE — Assessment & Plan Note (Signed)
Moderate neutropenia noted on labs today related to chemotherapy.  We will continue to monitor this.  If his [place] improves tomorrow to at least close to 1500, we can consider proceeding with treatment tomorrow.

## 2020-11-27 NOTE — Assessment & Plan Note (Signed)
Stable since last visit.  Continue viscous lidocaine and baking soda rinses

## 2020-11-27 NOTE — Assessment & Plan Note (Signed)
Again discussed about as needed nausea medications.  He was instructed to try the Zofran or Compazine for nausea and use Ativan only if the other 2 medications do not help him.  He does not have any ongoing vomiting.  He expressed understanding of the recommendations.

## 2020-11-27 NOTE — Assessment & Plan Note (Signed)
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   He complains of fatigue, nausea weight loss in the past week, overall last week was pretty rough on him. Physical examination, mucositis appears to be stable, weight loss of almost 7 pounds since last visit. Labs reviewed, [place] of 4000.  If CBC tomorrow showed improved absolute neutrophil count, we can consider proceeding forward with chemotherapy.  If not we would like to delay chemotherapy by a week and instead plan cycle 5 of weekly chemotherapy on 12/05/2020 and cycle 6 of weekly cisplatin chemotherapy on 12/12/2020.  Detailed LOS instructions in place.   I have once again discussed about adjuvant chemotherapy with cisplatin and 5-fluorouracil for 3 cycles.

## 2020-11-27 NOTE — Progress Notes (Signed)
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   He complains of fatigue, nausea weight loss in the past week, overall last week was pretty rough on him. Physical examination, mucositis appears to be stable, weight loss of almost 7 pounds since last visit. Labs reviewed, Alan [facility] of 4000.  If CBC tomorrow showed improved absolute neutrophil count, we can consider proceeding forward with chemotherapy.  If not we would like to delay chemotherapy by a week and instead plan cycle 5 of weekly chemotherapy on 12/05/2020 and cycle 6 of weekly cisplatin chemotherapy on 12/12/2020.  Detailed LOS instructions in place.   I have once again discussed about adjuvant chemotherapy with cisplatin and 5-fluorouracil for 3 cycles.  Chemotherapy induced nausea and vomiting Again discussed about as needed nausea medications.  He was instructed to try the Zofran or Compazine for nausea and use Ativan only if the other 2 medications do not help him.  He does not have any ongoing vomiting.  He expressed understanding of the recommendations.  Mucositis due to antineoplastic therapy Stable since last visit.  Continue viscous lidocaine and baking soda rinses  Weight loss, unintentional Severe weight loss in the past week of almost 7 to 8 pounds.  He has not been eating very well, has been reluctant to use the G-tube.  Encouraged using  the G-tube as recommended if he cannot swallow food.  He was also encouraged to drink some Ensure in addition to oral food intake if he still does not want to use the G-tube.  We have discussed about at least 4 cans of Osmolite a day to slowly escalate nutrition and if well-tolerated can increase it further.  If he is reluctant to use Osmolite, he can try and drink at least 3 to 4 cans of Ensure along with increased oral food intake.  Chemotherapy induced neutropenia (HCC) Moderate neutropenia noted on labs today related to chemotherapy.  We will continue to monitor this.  If his [place] improves tomorrow to at least close to 1500, we can consider proceeding with treatment tomorrow.   No orders of the defined types were placed in this encounter.    HISTORY OF PRESENTING ILLNESS:  Alan Leech Sr. 65 y.o. male is here because of SCC of nasopharyngeal origin, EBV neg.  Oncology History Overview Note  Alan Ruhland. is a 65 y.o. male who presented with six-month history of gradually enlarging bilateral lymph nodes with associated mild discomfort and pressure.   Subsequently, the patient saw Dr. Wilburn [facility], who recommended CT scan of neck and ultrasound-guided needle core biopsy for soft tissue diagnosis.   Biopsy of right neck lymph node on 08/30/2020 revealed: squamous cell carcinoma, p16 positive. EBV ordered, negative.  He had CT soft tissue neck done on August 07, 2020 which showed nasopharyngeal soft tissue prominence, greatest soft tissue effacement of adjacent right parapharyngeal fat suspected to be at least enlarged right retropharyngeal lymph node.  Bulky bilateral cervical midline likely right intraparotid  lymphadenopathy  PET/CT scan shows intense FDG uptake with area of increased soft tissue fullness in the posterior nasopharynx.  Extensive bulky bilateral FDG avid cervical adenopathy.  Large FDG avid lymph node also identified within the right parotid gland.  Imaging findings  compatible with metastatic adenopathy.  Subcentimeter right supraclavicular lymph node exhibits mild FDG uptake of low background activity equivocal for nodal metastasis.  No additional signs of metastatic disease.  He had  right neck lymph node biopsy which is positive for squamous cell carcinoma, p16 positive, EBV negative  During his initial visit, we recommended concurrent CRT followed by adjuvant chemotherapy. We discussed about every 21 day cisplatin, but he was very reluctant to proceed with every 21 days cisplatin, hence now on weekly cisplatin.    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[place])  10/04/2020 Initial Diagnosis   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[place])   10/04/2020 Cancer Staging   Staging form: Pharynx - Nasopharynx, AJCC 8th Edition - Clinical stage from 10/04/2020: Stage IVA (cT1, cN3, cM0) - Signed by Alan Gibson, MD on 10/04/2020 Stage prefix: Initial diagnosis   Nasopharynx cancer ([place])  10/08/2020 Initial Diagnosis   Nasopharynx cancer ([place])   10/31/2020 -  Chemotherapy    Patient is on Treatment Plan: HEAD/NECK CISPLATIN Q7D + XRT X 6 CYCLES / CISPLATIN D1 + 5FU IVCI D1-4 Q28D X 3 CYCLES        INTERIM HISTORY  Alan Henry is here for a follow-up before planned cycle 5 of weekly cisplatin. He returns today with his wife.  He is feeling very tired, last week has been pretty rough for him.  He has lost about 7 pounds or so in the past week.  He tells me that when he weighed on Monday, he had 2 scales telling him that he was 180 pounds and 1 scale telling him it was 175 pounds.  His weight today was recording it at 172.9 pounds.  He has only been eating some soup with vegetables with chicken versus beef stock.  He has been only drinking 1 Ensure or less a day and only 1 can of Osmolite in his G-tube.  Pain in his mouth is tolerable, using baking soda and viscous lidocaine rinses.  He has been having nausea, only taking lorazepam for nausea.  No vomiting.  No fevers or  chills.  Bowel habits without any overt constipation or diarrhea. Rest of the pertinent 10 point ROS reviewed and negative.  MEDICAL HISTORY:  Past Medical History:  Diagnosis Date  . Chest pain    2021  . History of kidney stones   . Hypertension   . Nasopharyngeal cancer ([place])   . Pneumonia   . Sleep apnea     SURGICAL HISTORY: Past Surgical History:  Procedure Laterality Date  . FINE NEEDLE ASPIRATION BIOPSY    . HERNIA REPAIR     Umbilicatl hernia  . IR IMAGING GUIDED PORT INSERTION  10/18/2020  . LAPAROSCOPIC INSERTION GASTROSTOMY TUBE N/A 10/23/2020   Procedure: LAPAROSCOPIC ASSISTED PEG TUBE;  Surgeon: Dwan Bolt, MD;  Location: [facility];  Service: General;  Laterality: N/A;  52  . ROTATOR CUFF REPAIR    . Torn Labrum      SOCIAL HISTORY: Social History   Socioeconomic History  . Marital status: Married    Spouse name: Not on file  . Number of children: Not on file  . Years of education: Not on file  . Highest education level: Not on file  Occupational  History  . Not on file  Tobacco Use  . Smoking status: Never Smoker  . Smokeless tobacco: Never Used  Vaping Use  . Vaping Use: Never used  Substance and Sexual Activity  . Alcohol use: Not Currently    Comment: very rarely  . Drug use: No  . Sexual activity: Not Currently  Other Topics Concern  . Not on file  Social History Narrative  . Not on file   Social Determinants of Health   Financial Resource Strain: Not on file  Food Insecurity: No Food Insecurity  . Worried About Charity fundraiser in the Last Year: Never true  . Ran Out of Food in the Last Year: Never true  Transportation Needs: No Transportation Needs  . Lack of Transportation (Medical): No  . Lack of Transportation (Non-Medical): No  Physical Activity: Not on file  Stress: No Stress Concern Present  . Feeling of Stress : Not at all  Social Connections: Socially Integrated  . Frequency of Communication with Friends and Family:  More than three times a week  . Frequency of Social Gatherings with Friends and Family: Twice a week  . Attends Religious Services: More than 4 times per year  . Active Member of Clubs or Organizations: Yes  . Attends Archivist Meetings: 1 to 4 times per year  . Marital Status: Married  Human resources officer Violence: Not on file    FAMILY HISTORY: No family history on file.  ALLERGIES:  has No Known Allergies.  MEDICATIONS:  Current Outpatient Medications  Medication Sig Dispense Refill  . amLODipine (NORVASC) 10 MG tablet Take 10 mg by mouth daily.    Marland Kitchen atorvastatin (LIPITOR) 80 MG tablet Take 80 mg by mouth daily.    . cetirizine (ZYRTEC) 10 MG tablet Take 10 mg by mouth daily.    Marland Kitchen dexamethasone (DECADRON) 4 MG tablet Take 2 tablets (8 mg total) by mouth daily. Take daily x 3 days starting the day after cisplatin chemotherapy. Take with food. 30 tablet 1  . docusate sodium (COLACE) 100 MG capsule Take 1 capsule (100 mg total) by mouth 2 (two) times daily. 60 capsule 0  . ergocalciferol (VITAMIN D2) 1.25 MG (50000 UT) capsule Take 50,000 Units by mouth once a week. Wednesday    . HYDROcodone-acetaminophen (HYCET) 7.5-325 mg/15 ml solution Take 10 mLs by mouth every 8 (eight) hours as needed for moderate pain. 473 mL 0  . lidocaine (XYLOCAINE) 2 % solution Patient: Mix 1part 2% viscous lidocaine, 1part H20. Swish & swallow 71mL of diluted mixture, 16min before meals and at bedtime, up to QID 200 mL 3  . lidocaine-prilocaine (EMLA) cream Apply to affected area once 30 g 3  . LORazepam (ATIVAN) 0.5 MG tablet Take 1 tablet (0.5 mg total) by mouth every 6 (six) hours as needed (Nausea or vomiting). 30 tablet 0  . nitroGLYCERIN (NITROSTAT) 0.4 MG SL tablet Place 0.4 mg under the tongue every 5 (five) minutes x 3 doses as needed for chest pain.    . Nutritional Supplements (FEEDING SUPPLEMENT, OSMOLITE 1.5 CAL,) LIQD Begin 1 carton Osmolite 1.5 via G-tube 4 times daily with 60 mL free  water before and after bolus feeding.  Increase to 1 carton Osmolite 1.5 at 1 feeding and 2 cartons Osmolite 1.5 at 3 feedings for a total of 7 cartons daily.  Flush or drink additional 240 mL free water 3 times daily.  Provides 2485 cal, 104 g protein, 2467 mL free water. (100% calorie  needs, 95% protein needs) 1659 mL 0  . omeprazole (PRILOSEC) 20 MG capsule Take 20 mg by mouth daily.    . ondansetron (ZOFRAN) 8 MG tablet Take 1 tablet (8 mg total) by mouth 2 (two) times daily as needed. Start on the third day after cisplatin chemotherapy. 30 tablet 1  . prochlorperazine (COMPAZINE) 10 MG tablet Take 1 tablet (10 mg total) by mouth every 6 (six) hours as needed (Nausea or vomiting). 30 tablet 1  . sildenafil (VIAGRA) 50 MG tablet Take 50-100 mg by mouth daily as needed for erectile dysfunction.     No current facility-administered medications for this visit.    PHYSICAL EXAMINATION:  ECOG PERFORMANCE STATUS: 1 - Symptomatic but completely ambulatory  Vitals:   11/27/20 0944  BP: (!) 140/94  Pulse: 97  Resp: 17  Temp: 97.7 F (36.5 C)  SpO2: 100%   Filed Weights   11/27/20 0944  Weight: 172 lb 14.4 oz (78.4 kg)    Physical Exam Constitutional:      Appearance: Normal appearance.  HENT:     Head: Normocephalic and atraumatic.     Mouth/Throat:     Mouth: Mucous membranes are moist.     Pharynx: Oropharyngeal exudate (Mucositis noted in the roof of the mouth, not overtly different since his last visit) and posterior oropharyngeal erythema present.  Cardiovascular:     Rate and Rhythm: Normal rate and regular rhythm.  Pulmonary:     Effort: Pulmonary effort is normal.     Breath sounds: Normal breath sounds.  Abdominal:     General: Abdomen is flat. Bowel sounds are normal.     Palpations: Abdomen is soft.     Comments: G-tube in place, appears well without any evidence of infection  Musculoskeletal:        General: No swelling or tenderness. Normal range of motion.      Cervical back: Normal range of motion and neck supple. No rigidity.  Lymphadenopathy:     Cervical: Cervical adenopathy (Cervical adenopathy improving bilaterally) present.  Skin:    General: Skin is warm and dry.  Neurological:     General: No focal deficit present.     Mental Status: He is alert.  Psychiatric:        Mood and Affect: Mood normal.        Behavior: Behavior normal.    LABORATORY DATA:  I have reviewed the data as listed Lab Results  Component Value Date   WBC 1.3 (L) 11/27/2020   HGB 13.0 11/27/2020   HCT 38.8 (L) 11/27/2020   MCV 87.8 11/27/2020   PLT 100 (L) 11/27/2020     Chemistry      Component Value Date/Time   NA 137 11/27/2020 0920   K 3.6 11/27/2020 0920   CL 98 11/27/2020 0920   CO2 29 11/27/2020 0920   BUN 16 11/27/2020 0920   CREATININE 1.28 (H) 11/27/2020 0920      Component Value Date/Time   CALCIUM 9.1 11/27/2020 0920   ALKPHOS 84 11/27/2020 0920   AST 11 (L) 11/27/2020 0920   ALT 14 11/27/2020 0920   BILITOT 0.7 11/27/2020 0920      Labs reviewed.  [place] over thousand consistent with moderate neutropenia CMP reviewed, elevation in creatinine.  RADIOGRAPHIC STUDIES: I have personally reviewed the radiological images as listed and agreed with the findings in the report. No results found.  All questions were answered. The patient knows to call the clinic with any problems, questions  or concerns.    Benay Pike, MD 11/27/2020 12:02 PM

## 2020-11-27 NOTE — Assessment & Plan Note (Signed)
Severe weight loss in the past week of almost 7 to 8 pounds.  He has not been eating very well, has been reluctant to use the G-tube.  Encouraged using the G-tube as recommended if he cannot swallow food.  He was also encouraged to drink some Ensure in addition to oral food intake if he still does not want to use the G-tube.  We have discussed about at least 4 cans of Osmolite a day to slowly escalate nutrition and if well-tolerated can increase it further.  If he is reluctant to use Osmolite, he can try and drink at least 3 to 4 cans of Ensure along with increased oral food intake.

## 2020-11-28 ENCOUNTER — Inpatient Hospital Stay: Payer: 59

## 2020-11-28 ENCOUNTER — Ambulatory Visit
Admission: RE | Admit: 2020-11-28 | Discharge: 2020-11-28 | Disposition: A | Discharge status: Home / Self Care | Payer: 59 | Source: Ambulatory Visit | Attending: Radiation Oncology | Admitting: Radiation Oncology

## 2020-11-28 ENCOUNTER — Other Ambulatory Visit: Payer: Self-pay

## 2020-11-28 ENCOUNTER — Telehealth: Payer: Self-pay

## 2020-11-28 ENCOUNTER — Encounter (HOSPITAL_COMMUNITY): Payer: 59 | Admitting: Dietician

## 2020-11-28 ENCOUNTER — Encounter (HOSPITAL_COMMUNITY): Payer: Self-pay | Admitting: Dietician

## 2020-11-28 DIAGNOSIS — Z95828 Presence of other vascular implants and grafts: Secondary | ICD-10-CM

## 2020-11-28 DIAGNOSIS — T451X5A Adverse effect of antineoplastic and immunosuppressive drugs, initial encounter: Secondary | ICD-10-CM

## 2020-11-28 DIAGNOSIS — Z5111 Encounter for antineoplastic chemotherapy: Secondary | ICD-10-CM | POA: Diagnosis not present

## 2020-11-28 DIAGNOSIS — C119 Malignant neoplasm of nasopharynx, unspecified: Secondary | ICD-10-CM

## 2020-11-28 DIAGNOSIS — C760 Malignant neoplasm of head, face and neck: Secondary | ICD-10-CM

## 2020-11-28 DIAGNOSIS — R634 Abnormal weight loss: Secondary | ICD-10-CM | POA: Diagnosis not present

## 2020-11-28 DIAGNOSIS — C113 Malignant neoplasm of anterior wall of nasopharynx: Secondary | ICD-10-CM

## 2020-11-28 DIAGNOSIS — D701 Agranulocytosis secondary to cancer chemotherapy: Secondary | ICD-10-CM

## 2020-11-28 LAB — CBC WITH DIFFERENTIAL (CANCER CENTER ONLY)
Abs Immature Granulocytes: 0.01 10*3/uL (ref 0.00–0.07)
Basophils Absolute: 0 10*3/uL (ref 0.0–0.1)
Basophils Relative: 0 %
Eosinophils Absolute: 0 10*3/uL (ref 0.0–0.5)
Eosinophils Relative: 1 %
HCT: 37.2 % — ABNORMAL LOW (ref 39.0–52.0)
Hemoglobin: 12.5 g/dL — ABNORMAL LOW (ref 13.0–17.0)
Immature Granulocytes: 1 %
Lymphocytes Relative: 6 %
Lymphs Abs: 0.1 10*3/uL — ABNORMAL LOW (ref 0.7–4.0)
MCH: 29.7 pg (ref 26.0–34.0)
MCHC: 33.6 g/dL (ref 30.0–36.0)
MCV: 88.4 fL (ref 80.0–100.0)
Monocytes Absolute: 0.1 10*3/uL (ref 0.1–1.0)
Monocytes Relative: 11 %
Neutro Abs: 0.8 10*3/uL — ABNORMAL LOW (ref 1.7–7.7)
Neutrophils Relative %: 81 %
Platelet Count: 102 10*3/uL — ABNORMAL LOW (ref 150–400)
RBC: 4.21 MIL/uL — ABNORMAL LOW (ref 4.22–5.81)
RDW: 15.6 % — ABNORMAL HIGH (ref 11.5–15.5)
WBC Count: 1 10*3/uL — ABNORMAL LOW (ref 4.0–10.5)
nRBC: 0 % (ref 0.0–0.2)

## 2020-11-28 LAB — CMP (CANCER CENTER ONLY)
ALT: 13 U/L (ref 0–44)
AST: 12 U/L — ABNORMAL LOW (ref 15–41)
Albumin: 3.3 g/dL — ABNORMAL LOW (ref 3.5–5.0)
Alkaline Phosphatase: 78 U/L (ref 38–126)
Anion gap: 10 (ref 5–15)
BUN: 18 mg/dL (ref 8–23)
CO2: 29 mmol/L (ref 22–32)
Calcium: 8.9 mg/dL (ref 8.9–10.3)
Chloride: 98 mmol/L (ref 98–111)
Creatinine: 1.13 mg/dL (ref 0.61–1.24)
GFR, Estimated: >60 mL/min (ref >60)
Glucose, Bld: 97 mg/dL (ref 70–99)
Potassium: 4 mmol/L (ref 3.5–5.1)
Sodium: 137 mmol/L (ref 135–145)
Total Bilirubin: 0.6 mg/dL (ref 0.3–1.2)
Total Protein: 6.9 g/dL (ref 6.5–8.1)

## 2020-11-28 LAB — MAGNESIUM: Magnesium: 1.6 mg/dL — ABNORMAL LOW (ref 1.7–2.4)

## 2020-11-28 MED ORDER — SODIUM CHLORIDE 0.9 % IV SOLN
Freq: Once | INTRAVENOUS | Status: AC
  Filled 2020-11-28: qty 250

## 2020-11-28 MED ORDER — SODIUM CHLORIDE 0.9% FLUSH
10.0000 mL | Freq: Once | INTRAVENOUS | Status: AC
  Administered 2020-11-28: 10 mL
  Filled 2020-11-28: qty 10

## 2020-11-28 NOTE — Telephone Encounter (Signed)
Per Dr. Chryl Heck - patient will not be receiving chemotherapy treatment today d/t abnormal labs. Patient will receive 1 L of NS over 1 hour instead. Infusion RN and Agricultural consultant made aware. Orders placed.

## 2020-11-28 NOTE — Progress Notes (Signed)
Nutrition Follow-up:  Patient with nasopharyngeal cancer of soft palate. He is receiving current chemoradiation with Cisplatin.  Spoke with patient via telephone this afternoon. He reports ongoing nausea and further progression of loss of taste which is frustrating for patient. Patient asking if it is normal for symptoms to appear late in the treatment after not having any in the beginning, states he thought he would be feeling better, not worse. He reports no longer being able to taste home made chicken soup is wife prepares, Ensure which he really enjoyed drinking, turkey/meats, and milkshakes which he was able to do last week. He continues to eat orally, but this is becoming more difficult for him. He continues to drink 1 Ensure by mouth and asking if he can put supplement in tube instead of drinking orally. Patient reports giving one carton of Osmolite today, says he recently returned home from infusion for IVF. Patient reports nausea prior to feeding which has continued, says he plans on giving another feeding soon. Patient reports he can take another Compazine at [facility].    Medications: Hydrocodone, Ativan, Zofran, Compazine  Labs: Mg 1.6  Anthropometrics: Weight 172 lb 14.4 oz on 5/25 decreased 8 lbs from 180 lb on 5/18 (4.4% in 7 days which is significant)   Estimated Energy Needs  Kcals: 2400-2600 Protein: 110-126 Fluid: 2.6 L  TF Goal: 7 cartons of Osmolite 1.5 daily over four feedings with 60 ml free water before and after bolus. Drink or flush with additional 240 ml water daily. This provides 2485 kcal, 104 grams protein, 2467 ml free water.   NUTRITION DIAGNOSIS: Food and nutrition related knowledge deficit continues   INTERVENTION:  Reviewed importance of maintaining adequate nutrition  Pt will increase tube feedings to 4 cartons/day with plan to progress towards goal of 7 cartons/day if he is unable to tolerate oral intake Encouraged continuing to eat soft high protein foods  as tolerated Discussed allowing 30 minutes after taking nausea medications prior to oral intake/tube feedings Continue drinking Ensure supplement, advised pt to use Osmolite 1.5 vs Ensure via tube  Discussed strategies for nausea -trying cold/room temperature foods, encouraged small, frequent meals and snacks, suggested using supplement mixed with ice cream for high calorie, high protein shake Continue baking soda/salt water rinses  Offered support and encouragement Contact information provided    MONITORING, EVALUATION, GOAL: Patient will tolerate adequate calories and protein to minimize further weight loss   NEXT VISIT: Wednesday June 1 via telephone

## 2020-11-29 ENCOUNTER — Ambulatory Visit
Admission: RE | Admit: 2020-11-29 | Discharge: 2020-11-29 | Disposition: A | Discharge status: Home / Self Care | Payer: 59 | Source: Ambulatory Visit | Attending: Radiation Oncology | Admitting: Radiation Oncology

## 2020-11-29 ENCOUNTER — Telehealth: Payer: Self-pay | Admitting: Hematology and Oncology

## 2020-11-29 DIAGNOSIS — R634 Abnormal weight loss: Secondary | ICD-10-CM | POA: Diagnosis not present

## 2020-11-29 NOTE — Telephone Encounter (Signed)
Scheduled per 05/25 los, patient has been called and notified of upcoming appointments.

## 2020-12-03 ENCOUNTER — Ambulatory Visit
Admission: RE | Admit: 2020-12-03 | Discharge: 2020-12-03 | Disposition: A | Discharge status: Home / Self Care | Payer: 59 | Source: Ambulatory Visit | Attending: Radiation Oncology | Admitting: Radiation Oncology

## 2020-12-03 ENCOUNTER — Other Ambulatory Visit: Payer: Self-pay

## 2020-12-03 VITALS — BP 116/88 | HR 117 | Temp 96.8°F | Resp 19 | Wt 164.2 lb

## 2020-12-03 DIAGNOSIS — R634 Abnormal weight loss: Secondary | ICD-10-CM

## 2020-12-03 DIAGNOSIS — Z95828 Presence of other vascular implants and grafts: Secondary | ICD-10-CM

## 2020-12-03 DIAGNOSIS — C119 Malignant neoplasm of nasopharynx, unspecified: Secondary | ICD-10-CM

## 2020-12-03 MED ORDER — SODIUM CHLORIDE 0.9% FLUSH
10.0000 mL | Freq: Once | INTRAVENOUS | Status: AC
  Administered 2020-12-03: 10 mL via INTRAVENOUS

## 2020-12-03 MED ORDER — HEPARIN SOD (PORK) LOCK FLUSH 100 UNIT/ML IV SOLN
500.0000 [IU] | Freq: Once | INTRAVENOUS | Status: AC
  Administered 2020-12-03: 500 [IU] via INTRAVENOUS

## 2020-12-03 MED ORDER — SODIUM CHLORIDE 0.9 % IV SOLN
INTRAVENOUS | Status: AC
  Filled 2020-12-03: qty 250

## 2020-12-03 NOTE — Progress Notes (Signed)
Patient came around to clinic for PUT after radiation this morning. Noticed significant weight loss and decrease in BP when patient changed positions (going from sitting to standing). Reports he is experiencing diarrhea about 1-1.5 hours after every Osmolite feeding via his PEG. Patient has lab appointment and F/U with Dr. Chryl Heck and Registered Dietician tomorrow, so received orders to provide 1L-NS over 2 hrs by Dr. Isidore Moos. PAC accessed and IVF started a little before 12:00. Will recheck BP before patient leaves. Call bell within reach, and patient resting comfortably with side rails up  Vitals:   12/03/20 1120 12/03/20 1122  BP: (!) 140/91 116/88  Pulse: (!) 111 (!) 117  Resp: 19   Temp: (!) 96.8 F (36 C)   SpO2: 98%

## 2020-12-04 ENCOUNTER — Ambulatory Visit: Payer: 59 | Admitting: Hematology and Oncology

## 2020-12-04 ENCOUNTER — Encounter: Payer: Self-pay | Admitting: Hematology and Oncology

## 2020-12-04 ENCOUNTER — Other Ambulatory Visit: Payer: Self-pay

## 2020-12-04 ENCOUNTER — Ambulatory Visit
Admission: RE | Admit: 2020-12-04 | Discharge: 2020-12-04 | Disposition: A | Discharge status: Home / Self Care | Payer: 59 | Source: Ambulatory Visit | Attending: Radiation Oncology | Admitting: Radiation Oncology

## 2020-12-04 ENCOUNTER — Inpatient Hospital Stay: Payer: 59 | Attending: Hematology and Oncology

## 2020-12-04 ENCOUNTER — Inpatient Hospital Stay: Payer: 59 | Admitting: Hematology and Oncology

## 2020-12-04 ENCOUNTER — Inpatient Hospital Stay: Payer: 59 | Admitting: Dietician

## 2020-12-04 DIAGNOSIS — C113 Malignant neoplasm of anterior wall of nasopharynx: Secondary | ICD-10-CM

## 2020-12-04 DIAGNOSIS — K5900 Constipation, unspecified: Secondary | ICD-10-CM | POA: Insufficient documentation

## 2020-12-04 DIAGNOSIS — R634 Abnormal weight loss: Secondary | ICD-10-CM | POA: Insufficient documentation

## 2020-12-04 DIAGNOSIS — T451X5A Adverse effect of antineoplastic and immunosuppressive drugs, initial encounter: Secondary | ICD-10-CM

## 2020-12-04 DIAGNOSIS — T451X5D Adverse effect of antineoplastic and immunosuppressive drugs, subsequent encounter: Secondary | ICD-10-CM | POA: Insufficient documentation

## 2020-12-04 DIAGNOSIS — R112 Nausea with vomiting, unspecified: Secondary | ICD-10-CM | POA: Diagnosis not present

## 2020-12-04 DIAGNOSIS — R5383 Other fatigue: Secondary | ICD-10-CM | POA: Insufficient documentation

## 2020-12-04 DIAGNOSIS — Z95828 Presence of other vascular implants and grafts: Secondary | ICD-10-CM | POA: Insufficient documentation

## 2020-12-04 DIAGNOSIS — D701 Agranulocytosis secondary to cancer chemotherapy: Secondary | ICD-10-CM

## 2020-12-04 DIAGNOSIS — K1231 Oral mucositis (ulcerative) due to antineoplastic therapy: Secondary | ICD-10-CM | POA: Insufficient documentation

## 2020-12-04 DIAGNOSIS — Z5111 Encounter for antineoplastic chemotherapy: Secondary | ICD-10-CM | POA: Diagnosis present

## 2020-12-04 DIAGNOSIS — E860 Dehydration: Secondary | ICD-10-CM

## 2020-12-04 DIAGNOSIS — C119 Malignant neoplasm of nasopharynx, unspecified: Secondary | ICD-10-CM | POA: Insufficient documentation

## 2020-12-04 LAB — CMP (CANCER CENTER ONLY)
ALT: 9 U/L (ref 0–44)
AST: 11 U/L — ABNORMAL LOW (ref 15–41)
Albumin: 3.3 g/dL — ABNORMAL LOW (ref 3.5–5.0)
Alkaline Phosphatase: 75 U/L (ref 38–126)
Anion gap: 11 (ref 5–15)
BUN: 18 mg/dL (ref 8–23)
CO2: 29 mmol/L (ref 22–32)
Calcium: 9.6 mg/dL (ref 8.9–10.3)
Chloride: 97 mmol/L — ABNORMAL LOW (ref 98–111)
Creatinine: 1.38 mg/dL — ABNORMAL HIGH (ref 0.61–1.24)
GFR, Estimated: 58 mL/min — ABNORMAL LOW (ref >60)
Glucose, Bld: 160 mg/dL — ABNORMAL HIGH (ref 70–99)
Potassium: 4.5 mmol/L (ref 3.5–5.1)
Sodium: 137 mmol/L (ref 135–145)
Total Bilirubin: 0.5 mg/dL (ref 0.3–1.2)
Total Protein: 7.3 g/dL (ref 6.5–8.1)

## 2020-12-04 LAB — CBC WITH DIFFERENTIAL (CANCER CENTER ONLY)
Abs Immature Granulocytes: 0 10*3/uL (ref 0.00–0.07)
Basophils Absolute: 0 10*3/uL (ref 0.0–0.1)
Basophils Relative: 2 %
Eosinophils Absolute: 0 10*3/uL (ref 0.0–0.5)
Eosinophils Relative: 0 %
HCT: 36.1 % — ABNORMAL LOW (ref 39.0–52.0)
Hemoglobin: 12.1 g/dL — ABNORMAL LOW (ref 13.0–17.0)
Immature Granulocytes: 0 %
Lymphocytes Relative: 6 %
Lymphs Abs: 0 10*3/uL — ABNORMAL LOW (ref 0.7–4.0)
MCH: 30 pg (ref 26.0–34.0)
MCHC: 33.5 g/dL (ref 30.0–36.0)
MCV: 89.6 fL (ref 80.0–100.0)
Monocytes Absolute: 0.2 10*3/uL (ref 0.1–1.0)
Monocytes Relative: 23 %
Neutro Abs: 0.5 10*3/uL — ABNORMAL LOW (ref 1.7–7.7)
Neutrophils Relative %: 69 %
Platelet Count: 133 10*3/uL — ABNORMAL LOW (ref 150–400)
RBC: 4.03 MIL/uL — ABNORMAL LOW (ref 4.22–5.81)
RDW: 15.7 % — ABNORMAL HIGH (ref 11.5–15.5)
WBC Count: 0.7 10*3/uL — CL (ref 4.0–10.5)
nRBC: 0 % (ref 0.0–0.2)

## 2020-12-04 LAB — MAGNESIUM: Magnesium: 1.7 mg/dL (ref 1.7–2.4)

## 2020-12-04 MED ORDER — PROCHLORPERAZINE MALEATE 10 MG PO TABS
ORAL_TABLET | ORAL | Status: AC
  Filled 2020-12-04: qty 1

## 2020-12-04 NOTE — Progress Notes (Signed)
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   He complains of ongoing fatigue, loss of appetite, dysgeusia and weight loss.  Mucositis is not as bothersome, he is barely using viscous lidocaine. Physical examination today, mild mucositis, appeared to have lost weight, otherwise no major abnormalities.  CBC today showed ANC of 500, hence we cannot proceed with planned chemotherapy tomorrow.  He will however get some IV fluids tomorrow. He also has appointment with our dietitian today, encouraged him to at least try Ensure 3-4 times a day if he cannot tolerate Osmolite or follow-up on the dietitian recommendations. He understands that we will try repeating the labs next week and if his labs are within parameters, we will try to proceed with planned week 5 of cisplatin. Once he completes radiation, we will try to wait for 3 to 4 weeks to allow complete recovery and then discuss about proceeding with planned adjuvant chemotherapy.  Mucositis due to antineoplastic therapy Continue viscous lidocaine.  Patient does not want to try any liquid narcotics for pain control.  Weight loss, unintentional He lost about 7 pounds since last visit.  I have encouraged him to continue drinking  Ensure via the G-tube and follow with the dietitian today.  He is able to drink liquids at this time but has trouble eating since he has no  appetite.   Chemotherapy induced neutropenia (HCC) Severe neutropenia secondary to chemotherapy, we will have to delay chemotherapy to allow recovery.  Cannot use growth factors concurrent with radiation in most instances.  No orders of the defined types were placed in this encounter.    HISTORY OF PRESENTING ILLNESS:  Alan Leech Sr. 65 y.o. male is here because of SCC of nasopharyngeal origin, EBV neg.  Oncology History Overview Note  Alan Henry. is a 65 y.o. male who presented with six-month history of gradually enlarging bilateral lymph nodes with associated mild discomfort and pressure.   Subsequently, the patient saw Dr. Wilburn Cornelia, who recommended CT scan of neck and ultrasound-guided needle core biopsy for soft tissue diagnosis.   Biopsy of right neck lymph node on 08/30/2020 revealed: squamous cell carcinoma, p16 positive. EBV ordered, negative.  He had CT soft tissue neck done on August 07, 2020 which showed nasopharyngeal soft tissue prominence, greatest soft tissue effacement of adjacent right parapharyngeal fat suspected to be at least enlarged right retropharyngeal lymph node.  Bulky bilateral cervical midline likely right intraparotid lymphadenopathy  PET/CT scan shows intense FDG uptake with area of increased soft tissue fullness in the posterior nasopharynx.  Extensive bulky bilateral FDG avid cervical adenopathy.  Large FDG avid lymph node also identified within the right parotid gland.  Imaging findings compatible with metastatic adenopathy.  Subcentimeter right supraclavicular lymph node exhibits mild FDG uptake of low background activity equivocal for nodal metastasis.  No additional signs of metastatic disease.  He had  right neck lymph node biopsy which is positive for squamous cell carcinoma, p16 positive, EBV negative  During his initial visit, we recommended concurrent CRT followed by adjuvant chemotherapy. We discussed about every 21 day cisplatin,  but he was very reluctant to proceed with every 21 days cisplatin, hence now on weekly cisplatin.    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[place])  10/04/2020 Initial Diagnosis   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[place])   10/04/2020 Cancer Staging   Staging form: Pharynx - Nasopharynx, AJCC 8th Edition - Clinical stage from 10/04/2020: Stage IVA (cT1, cN3, cM0) - Signed by Eppie Gibson, MD on 10/04/2020 Stage prefix: Initial diagnosis   Nasopharynx cancer ([place])  10/08/2020 Initial Diagnosis   Nasopharynx cancer ([place])   10/31/2020 -  Chemotherapy    Patient is on Treatment Plan: HEAD/NECK CISPLATIN Q7D + XRT X 6 CYCLES / CISPLATIN D1 + 5FU IVCI D1-4 Q28D X 3 CYCLES        INTERIM HISTORY  Alan Henry is here for a follow-up before planned cycle 5 of weekly cisplatin. He had 1 week delay because of severe neutropenia when he came for his last visit.  Since last visit, he continues to have issues with eating, continues to lose weight, lost almost 6 to 7 pounds.  He is unable to use the G-tube, tells me that Osmolite causes severe abdominal cramping and diarrhea and he is not willing to try any more of it. Wife says he drank 1 Ensure for the whole week, he continues to drink liquids, water and soups but does not eat anything very [place] He has some pain in his mouth, but he is reluctant to try even viscous lidocaine. He says lack of taste buds and appetite is bothersome Diarrhea better since osmolite was stopped. No change in breathing, fevers or chills. No change in urinary habits.   MEDICAL HISTORY:  Past Medical History:  Diagnosis Date  . Chest pain    2021  . History of kidney stones   . Hypertension   . Nasopharyngeal cancer ([place])   . Pneumonia   . Sleep apnea     SURGICAL HISTORY: Past Surgical History:  Procedure Laterality Date  . FINE NEEDLE ASPIRATION BIOPSY    . HERNIA REPAIR     Umbilicatl hernia  . IR IMAGING GUIDED PORT INSERTION  10/18/2020  . LAPAROSCOPIC  INSERTION GASTROSTOMY TUBE N/A 10/23/2020   Procedure: LAPAROSCOPIC ASSISTED PEG TUBE;  Surgeon: Dwan Bolt, MD;  Location: [facility];  Service: General;  Laterality: N/A;  24  . ROTATOR CUFF REPAIR    . Torn Labrum      SOCIAL HISTORY: Social History   Socioeconomic History  . Marital status: Married    Spouse name: Not on file  . Number of children: Not on file  . Years of education: Not on file  . Highest education level: Not on file  Occupational History  . Not on file  Tobacco Use  . Smoking status: Never Smoker  . Smokeless tobacco: Never Used  Vaping Use  . Vaping Use: Never used  Substance and Sexual Activity  . Alcohol use: Not Currently    Comment: very rarely  . Drug use: No  . Sexual activity: Not Currently  Other Topics Concern  . Not on file  Social History Narrative  . Not on file   Social Determinants of Health   Financial Resource Strain: Not on file  Food Insecurity: No Food Insecurity  . Worried About Charity fundraiser in the Last Year: Never true  . Ran Out of Food in the  Last Year: Never true  Transportation Needs: No Transportation Needs  . Lack of Transportation (Medical): No  . Lack of Transportation (Non-Medical): No  Physical Activity: Not on file  Stress: No Stress Concern Present  . Feeling of Stress : Not at all  Social Connections: Socially Integrated  . Frequency of Communication with Friends and Family: More than three times a week  . Frequency of Social Gatherings with Friends and Family: Twice a week  . Attends Religious Services: More than 4 times per year  . Active Member of Clubs or Organizations: Yes  . Attends Archivist Meetings: 1 to 4 times per year  . Marital Status: Married  Human resources officer Violence: Not on file    FAMILY HISTORY: No family history on file.  ALLERGIES:  has No Known Allergies.  MEDICATIONS:  Current Outpatient Medications  Medication Sig Dispense Refill  . amLODipine (NORVASC) 10  MG tablet Take 10 mg by mouth daily.    Marland Kitchen atorvastatin (LIPITOR) 80 MG tablet Take 80 mg by mouth daily.    . cetirizine (ZYRTEC) 10 MG tablet Take 10 mg by mouth daily.    Marland Kitchen dexamethasone (DECADRON) 4 MG tablet Take 2 tablets (8 mg total) by mouth daily. Take daily x 3 days starting the day after cisplatin chemotherapy. Take with food. 30 tablet 1  . docusate sodium (COLACE) 100 MG capsule Take 1 capsule (100 mg total) by mouth 2 (two) times daily. 60 capsule 0  . ergocalciferol (VITAMIN D2) 1.25 MG (50000 UT) capsule Take 50,000 Units by mouth once a week. Wednesday    . HYDROcodone-acetaminophen (HYCET) 7.5-325 mg/15 ml solution Take 10 mLs by mouth every 8 (eight) hours as needed for moderate pain. 473 mL 0  . lidocaine (XYLOCAINE) 2 % solution Patient: Mix 1part 2% viscous lidocaine, 1part H20. Swish & swallow 56mL of diluted mixture, 14min before meals and at bedtime, up to QID 200 mL 3  . lidocaine-prilocaine (EMLA) cream Apply to affected area once 30 g 3  . LORazepam (ATIVAN) 0.5 MG tablet Take 1 tablet (0.5 mg total) by mouth every 6 (six) hours as needed (Nausea or vomiting). 30 tablet 0  . nitroGLYCERIN (NITROSTAT) 0.4 MG SL tablet Place 0.4 mg under the tongue every 5 (five) minutes x 3 doses as needed for chest pain.    . Nutritional Supplements (FEEDING SUPPLEMENT, OSMOLITE 1.5 CAL,) LIQD Begin 1 carton Osmolite 1.5 via G-tube 4 times daily with 60 mL free water before and after bolus feeding.  Increase to 1 carton Osmolite 1.5 at 1 feeding and 2 cartons Osmolite 1.5 at 3 feedings for a total of 7 cartons daily.  Flush or drink additional 240 mL free water 3 times daily.  Provides 2485 cal, 104 g protein, 2467 mL free water. (100% calorie needs, 95% protein needs) 1659 mL 0  . omeprazole (PRILOSEC) 20 MG capsule Take 20 mg by mouth daily.    . ondansetron (ZOFRAN) 8 MG tablet Take 1 tablet (8 mg total) by mouth 2 (two) times daily as needed. Start on the third day after cisplatin  chemotherapy. 30 tablet 1  . prochlorperazine (COMPAZINE) 10 MG tablet Take 1 tablet (10 mg total) by mouth every 6 (six) hours as needed (Nausea or vomiting). 30 tablet 1  . sildenafil (VIAGRA) 50 MG tablet Take 50-100 mg by mouth daily as needed for erectile dysfunction.     No current facility-administered medications for this visit.    PHYSICAL EXAMINATION:  ECOG PERFORMANCE  STATUS: 1 - Symptomatic but completely ambulatory  Vitals:   12/04/20 1045  BP: (!) 141/85  Pulse: 99  Resp: 18  Temp: 98 F (36.7 C)  SpO2: 100%   Filed Weights   12/04/20 1045  Weight: 167 lb 3.2 oz (75.8 kg)    Physical Exam Constitutional:      Appearance: Normal appearance.  HENT:     Head: Normocephalic and atraumatic.     Mouth/Throat:     Mouth: Mucous membranes are moist.     Pharynx: Oropharyngeal exudate (Mild mucositis in roof of mouth, no ulceration) and posterior oropharyngeal erythema present.  Cardiovascular:     Rate and Rhythm: Normal rate and regular rhythm.  Pulmonary:     Effort: Pulmonary effort is normal.     Breath sounds: Normal breath sounds.  Abdominal:     General: Abdomen is flat. Bowel sounds are normal.     Palpations: Abdomen is soft.     Comments: G-tube in place, appears well without any evidence of infection  Musculoskeletal:        General: No swelling or tenderness. Normal range of motion.     Cervical back: Normal range of motion and neck supple. No rigidity.  Lymphadenopathy:     Cervical: Cervical adenopathy (Cervical adenopathy has remarkably improved) present.  Skin:    General: Skin is warm and dry.  Neurological:     General: No focal deficit present.     Mental Status: He is alert.  Psychiatric:        Mood and Affect: Mood normal.        Behavior: Behavior normal.    LABORATORY DATA:  I have reviewed the data as listed Lab Results  Component Value Date   WBC 0.7 (LL) 12/04/2020   HGB 12.1 (L) 12/04/2020   HCT 36.1 (L) 12/04/2020    MCV 89.6 12/04/2020   PLT 133 (L) 12/04/2020     Chemistry      Component Value Date/Time   NA 137 12/04/2020 1007   K 4.5 12/04/2020 1007   CL 97 (L) 12/04/2020 1007   CO2 29 12/04/2020 1007   BUN 18 12/04/2020 1007   CREATININE 1.38 (H) 12/04/2020 1007      Component Value Date/Time   CALCIUM 9.6 12/04/2020 1007   ALKPHOS 75 12/04/2020 1007   AST 11 (L) 12/04/2020 1007   ALT 9 12/04/2020 1007   BILITOT 0.5 12/04/2020 1007     Labs reviewed.  ANC of 500.  RADIOGRAPHIC STUDIES: I have personally reviewed the radiological images as listed and agreed with the findings in the report. No results found.  All questions were answered. The patient knows to call the clinic with any problems, questions or concerns.    Benay Pike, MD 12/04/2020 1:47 PM

## 2020-12-04 NOTE — Assessment & Plan Note (Signed)
He lost about 7 pounds since last visit.  I have encouraged him to continue drinking  Ensure via the G-tube and follow with the dietitian today.  He is able to drink liquids at this time but has trouble eating since he has no appetite.

## 2020-12-04 NOTE — Assessment & Plan Note (Signed)
Continue viscous lidocaine.  Patient does not want to try any liquid narcotics for pain control.

## 2020-12-04 NOTE — Assessment & Plan Note (Signed)
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   He complains of ongoing fatigue, loss of appetite, dysgeusia and weight loss.  Mucositis is not as bothersome, he is barely using viscous lidocaine. Physical examination today, mild mucositis, appeared to have lost weight, otherwise no major abnormalities.  CBC today showed ANC of 500, hence we cannot proceed with planned chemotherapy tomorrow.  He will however get some IV fluids tomorrow. He also has appointment with our dietitian today, encouraged him to at least try Ensure 3-4 times a day if he cannot tolerate Osmolite or follow-up on the dietitian recommendations. He understands that we will try repeating the labs next week and if his labs are within parameters, we will try to proceed with planned week 5 of cisplatin. Once he completes radiation, we will try to wait for 3 to 4 weeks to allow complete recovery and then discuss about proceeding with planned adjuvant chemotherapy.

## 2020-12-04 NOTE — Assessment & Plan Note (Signed)
Severe neutropenia secondary to chemotherapy, we will have to delay chemotherapy to allow recovery.  Cannot use growth factors concurrent with radiation in most instances.

## 2020-12-04 NOTE — Progress Notes (Signed)
Patient will not get treatment on 6/2 due to abnormal labs. Orders placed for NS, 1L over 1 hour.

## 2020-12-05 ENCOUNTER — Ambulatory Visit: Payer: 59 | Attending: Radiation Oncology

## 2020-12-05 ENCOUNTER — Ambulatory Visit: Payer: 59

## 2020-12-05 ENCOUNTER — Inpatient Hospital Stay: Payer: 59

## 2020-12-05 ENCOUNTER — Encounter: Payer: Self-pay | Admitting: Hematology and Oncology

## 2020-12-05 ENCOUNTER — Other Ambulatory Visit: Payer: Self-pay

## 2020-12-05 ENCOUNTER — Ambulatory Visit
Admission: RE | Admit: 2020-12-05 | Discharge: 2020-12-05 | Disposition: A | Discharge status: Home / Self Care | Payer: 59 | Source: Ambulatory Visit | Attending: Radiation Oncology | Admitting: Radiation Oncology

## 2020-12-05 DIAGNOSIS — R1310 Dysphagia, unspecified: Secondary | ICD-10-CM | POA: Insufficient documentation

## 2020-12-05 DIAGNOSIS — C113 Malignant neoplasm of anterior wall of nasopharynx: Secondary | ICD-10-CM | POA: Diagnosis present

## 2020-12-05 DIAGNOSIS — E860 Dehydration: Secondary | ICD-10-CM

## 2020-12-05 DIAGNOSIS — R293 Abnormal posture: Secondary | ICD-10-CM | POA: Insufficient documentation

## 2020-12-05 DIAGNOSIS — R634 Abnormal weight loss: Secondary | ICD-10-CM | POA: Diagnosis not present

## 2020-12-05 DIAGNOSIS — Z5111 Encounter for antineoplastic chemotherapy: Secondary | ICD-10-CM | POA: Diagnosis not present

## 2020-12-05 MED ORDER — SODIUM CHLORIDE 0.9 % IV SOLN
Freq: Once | INTRAVENOUS | Status: AC
Start: 2020-12-05 — End: 2020-12-05
  Filled 2020-12-05: qty 250

## 2020-12-05 NOTE — Progress Notes (Signed)
Nutrition Follow-up:  Patient with nasopharyngeal cancer of soft palate. He is receiving current chemoradiation with Cisplatin.  Met with patient and wife in clinic on 6/1. Patient reports continuing to have nausea after tube feedings. Patient reports trying 3 feedings and having abdominal cramping followed by watery diarrhea within 10 minutes of each tube feeding. Patient reports he stopped using Osmolite formula and switched to Ensure via tube. Patient reports he gave 1 Ensure last night and 1 this morning via tube. He has not had diarrhea and nausea has improved. Patient asking how many Ensure he needs to give via tube.   Medications: reviewed  Labs: reviewed  Anthropometrics: Weight today 167 lb 3.2 oz decreased 4 lbs from 172 lb on 5/25; 2.9% significant  5/18 - 180 lb   Estimated Energy Needs  Kcals: 2400-2600 Protein: 110-126 Fluid: 2.6 L  NUTRITION DIAGNOSIS: Food and nutrition related knowledge deficit continues    INTERVENTION:  Provided pt with 8 sample cartons of [employer] 1.4 to try Patient will try giving 2 cartons today (1 carton at 3PM and 1 carton at [facility]) - instructed to call RD in the morning to discuss toleration of feedings If patient tolerates, will discontinue Osmolite 1.5 and place orders for [employer] 1.4  [employer] 1.4: Give 5 1/2 cartons split over four feedings. Flush with 60 ml water before and after each feeding. Drink by mouth or give via tube additional 3 1/2 cups water daily. This will provide 2503 kcal, 110 grams protein, 2600 ml total water.  MONITORING, EVALUATION, GOAL:  Weight trends, intake, tube feeding   NEXT VISIT: Thursday June 2 via telephone

## 2020-12-05 NOTE — Therapy (Signed)
[facility] [address] [place], [place], [address] Phone: [phone]   Fax:  [phone]  Speech Language Pathology Treatment  Patient Details  Name: Alan EASLER Sr. MRN: [id number] Date of Birth: 04/25/1956 Referring Provider (SLP): Eppie Gibson   Encounter Date: 12/05/2020   End of Session - 12/05/20 1451    Visit Number 2    Number of Visits 7    Date for SLP Re-Evaluation 02/05/21    SLP Start Time 1207    SLP Stop Time  1247    SLP Time Calculation (min) 40 min    Activity Tolerance Patient tolerated treatment well           Past Medical History:  Diagnosis Date  . Chest pain    2021  . History of kidney stones   . Hypertension   . Nasopharyngeal cancer ([place])   . Pneumonia   . Sleep apnea     Past Surgical History:  Procedure Laterality Date  . FINE NEEDLE ASPIRATION BIOPSY    . HERNIA REPAIR     Umbilicatl hernia  . IR IMAGING GUIDED PORT INSERTION  10/18/2020  . LAPAROSCOPIC INSERTION GASTROSTOMY TUBE N/A 10/23/2020   Procedure: LAPAROSCOPIC ASSISTED PEG TUBE;  Surgeon: Dwan Bolt, MD;  Location: [facility];  Service: General;  Laterality: N/A;  80  . ROTATOR CUFF REPAIR    . Torn Labrum      There were no vitals filed for this visit.   Subjective Assessment - 12/05/20 1225    Subjective Once a day "soft and mushy stuff" or soup by mouth. Dysgeusia/ageusia prevalent.    Currently in Pain? No/denies                 ADULT SLP TREATMENT - 12/05/20 1227      General Information   Behavior/Cognition Cooperative;Pleasant mood;Alert      Treatment Provided   Treatment provided Dysphagia      Dysphagia Treatment   Temperature Spikes Noted No    Respiratory Status Room air    Treatment Methods Skilled observation;Patient/caregiver education;Therapeutic exercise    Patient observed directly with PO's Yes    Type of PO's observed Dysphagia 1 (puree);Thin liquids    Oral Phase  Signs & Symptoms Other (comment)   no overt s/sx noted   Pharyngeal Phase Signs & Symptoms Other (comment)   no overt s/sx noted today   Other treatment/comments Pt is not using lidocaine due to he can't feel the bolus as it travels through his pharynx. He is having "a little bit of soft and mushy food" every day, and drinks water dailiy as well. Still is PEG dependent. SLP encouraged more POs as pt is able - likely before SLP sees pt for next visit in July. SLP provided pt with min A rarely for HEP.      Assessment / Recommendations / Plan   Plan Continue with current plan of care      Dysphagia Recommendations   Diet recommendations Dysphagia 1 (puree);Dysphagia 2 (fine chop);Thin liquid    Liquids provided via Cup    Medication Administration --   as tolerated   Compensations Small sips/bites;Slow rate      Progression Toward Goals   Progression toward goals Progressing toward goals            SLP Education - 12/05/20 1451    Education Details rationale for HEP, min A with HEP procedure    Person(s) Educated Patient  Methods Explanation;Demonstration;Verbal cues    Comprehension Verbalized understanding;Returned demonstration;Verbal cues required;Need further instruction             SLP Short Term Goals -6/2/20221602             SLP SHORT TERM GOAL #1    Title pt will complete HEP withraremin A     Time      Period -- sessions, for all STGs    Status Achieved          SLP SHORT TERM GOAL #2    Title pt will tell SLP why pt is completing HEP with modified independence     Time 1     Status Ongoing          SLP SHORT TERM GOAL #3    Title pt will describe 3 overt s/s aspiration PNA with modified independence     Time 1     Status Ongoing          SLP SHORT TERM GOAL #4    Title pt will tell SLP how a food journal could hasten return to a more normalized diet     Time 2     Status Ongoing                            SLP Long Term Goals -6/2/221603             SLP LONG TERM GOAL #1    Title pt will complete HEP withmodified independenceover 2 visits     Time 3     Period -- or 7 total sessions, for all LTGs    Status Ongoing          SLP LONG TERM GOAL #2    Title pt will describe how to modify HEP over time, and the timeline associated with reduction in HEP frequency with modified independence over two sessions     Time 6     Status Ongoing                  Plan -6/2/221557        Clinical Impression Statement At this time pt swallowing is deemed WNL/WFL with dys I (pureed) and thin liquids. Pt reports he is also eating some dys II and III foods and has no overt s/s of aspiration PNA at this time, so SLP deems pt also safe to cont to have those foods. SLP reviewed the individualized HEP for dysphagia and pt completed each exercise on their own with min SLPcues faded to modified independent. Data indicate that pt's swallow ability will likely decrease over the course of radiation therapy and could very well decline over time following conclusion of their radiation therapy due to muscle disuse atrophy and/or muscle fibrosis. Pt will cont to need to be seen by SLP in order to assess safety of PO intake, assess the need for recommending any objective swallow assessment, and ensuring pt correctly completes the individualized HEP.             Patient will benefit from skilled therapeutic intervention in order to improve the following deficits and impairments:   Dysphagia, unspecified type    Problem List Patient Active Problem List   Diagnosis Date Noted  . Chemotherapy induced neutropenia ([place]) 11/27/2020  . Mucositis due to antineoplastic therapy 11/13/2020  . Chemotherapy induced nausea and vomiting 11/06/2020  . Weight loss, unintentional 11/06/2020  . Constipation 11/06/2020  .  Port-A-Cath in  place 10/31/2020  . Nasopharyngeal cancer ([place]) 10/23/2020  . Nasopharynx cancer ([place]) 10/08/2020  . 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[place]) 10/04/2020  . Essential hypertension 11/14/2019  . Hyperlipidemia 11/14/2019  . Family history of heart disease 11/14/2019  . Chest pain of uncertain etiology 74/25/9563  . Nonspecific abnormal electrocardiogram (ECG) (EKG) 11/14/2019    [facility] ,MS, CCC-SLP  12/05/2020, 2:56 PM  [facility] [address] [place], [place], [address] Phone: [phone]   Fax:  [phone]   Name: Alan LAUREL Sr. MRN: [id number] Date of Birth: 09/03/1955

## 2020-12-05 NOTE — Patient Instructions (Signed)
Dehydration, Adult Dehydration is condition in which there is not enough water or other fluids in the body. This happens when a person loses more fluids than he or she takes in. Important body parts cannot work right without the right amount of fluids. Any loss of fluids from the body can cause dehydration. Dehydration can be mild, worse, or very bad. It should be treated right away to keep it from getting very bad. What are the causes? This condition may be caused by:  Conditions that cause loss of water or other fluids, such as: ? Watery poop (diarrhea). ? Vomiting. ? Sweating a lot. ? Peeing (urinating) a lot.  Not drinking enough fluids, especially when you: ? Are ill. ? Are doing things that take a lot of energy to do.  Other illnesses and conditions, such as fever or infection.  Certain medicines, such as medicines that take extra fluid out of the body (diuretics).  Lack of safe drinking water.  Not being able to get enough water and food. What increases the risk? The following factors may make you more likely to develop this condition:  Having a long-term (chronic) illness that has not been treated the right way, such as: ? Diabetes. ? Heart disease. ? Kidney disease.  Being 65 years of age or older.  Having a disability.  Living in a place that is high above the ground or sea (high in altitude). The thinner, dried air causes more fluid loss.  Doing exercises that put stress on your body for a long time. What are the signs or symptoms? Symptoms of dehydration depend on how bad it is. Mild or worse dehydration  Thirst.  Dry lips or dry mouth.  Feeling dizzy or light-headed, especially when you stand up from sitting.  Muscle cramps.  Your body making: ? Dark pee (urine). Pee may be the color of tea. ? Less pee than normal. ? Less tears than normal.  Headache. Very bad dehydration  Changes in skin. Skin may: ? Be cold to the touch (clammy). ? Be blotchy  or pale. ? Not go back to normal right after you lightly pinch it and let it go.  Little or no tears, pee, or sweat.  Changes in vital signs, such as: ? Fast breathing. ? Low blood pressure. ? Weak pulse. ? Pulse that is more than 100 beats a minute when you are sitting still.  Other changes, such as: ? Feeling very thirsty. ? Eyes that look hollow (sunken). ? Cold hands and feet. ? Being mixed up (confused). ? Being very tired (lethargic) or having trouble waking from sleep. ? Short-term weight loss. ? Loss of consciousness. How is this treated? Treatment for this condition depends on how bad it is. Treatment should start right away. Do not wait until your condition gets very bad. Very bad dehydration is an emergency. You will need to go to a hospital.  Mild or worse dehydration can be treated at home. You may be asked to: ? Drink more fluids. ? Drink an oral rehydration solution (ORS). This drink helps get the right amounts of fluids and salts and minerals in the blood (electrolytes).  Very bad dehydration can be treated: ? With fluids through an IV tube. ? By getting normal levels of salts and minerals in your blood. This is often done by giving salts and minerals through a tube. The tube is passed through your nose and into your stomach. ? By treating the root cause. Follow these instructions at   home: Oral rehydration solution If told by your doctor, drink an ORS:  Make an ORS. Use instructions on the package.  Start by drinking small amounts, about  cup (120 mL) every 5-10 minutes.  Slowly drink more until you have had the amount that your doctor said to have. Eating and drinking  Drink enough clear fluid to keep your pee pale yellow. If you were told to drink an ORS, finish the ORS first. Then, start slowly drinking other clear fluids. Drink fluids such as: ? Water. Do not drink only water. Doing that can make the salt (sodium) level in your body get too low. ? Water  from ice chips you suck on. ? Fruit juice that you have added water to (diluted). ? Low-calorie sports drinks.  Eat foods that have the right amounts of salts and minerals, such as: ? Bananas. ? Oranges. ? Potatoes. ? Tomatoes. ? Spinach.  Do not drink alcohol.  Avoid: ? Drinks that have a lot of sugar. These include:  High-calorie sports drinks.  Fruit juice that you did not add water to.  Soda.  Caffeine. ? Foods that are greasy or have a lot of fat or sugar.         General instructions  Take over-the-counter and prescription medicines only as told by your doctor.  Do not take salt tablets. Doing that can make the salt level in your body get too high.  Return to your normal activities as told by your doctor. Ask your doctor what activities are safe for you.  Keep all follow-up visits as told by your doctor. This is important. Contact a doctor if:  You have pain in your belly (abdomen) and the pain: ? Gets worse. ? Stays in one place.  You have a rash.  You have a stiff neck.  You get angry or annoyed (irritable) more easily than normal.  You are more tired or have a harder time waking than normal.  You feel: ? Weak or dizzy. ? Very thirsty. Get help right away if you have:  Any symptoms of very bad dehydration.  Symptoms of vomiting, such as: ? You cannot eat or drink without vomiting. ? Your vomiting gets worse or does not go away. ? Your vomit has blood or [place] stuff in it.  Symptoms that get worse with treatment.  A fever.  A very bad headache.  Problems with peeing or pooping (having a bowel movement), such as: ? Watery poop that gets worse or does not go away. ? Blood in your poop (stool). This may cause poop to look black and tarry. ? Not peeing in 6-8 hours. ? Peeing only a small amount of very dark pee in 6-8 hours.  Trouble breathing. These symptoms may be an emergency. Do not wait to see if the symptoms will go away. Get  medical help right away. Call your local emergency services (911 in the [place] Do not drive yourself to the hospital. Summary  Dehydration is a condition in which there is not enough water or other fluids in the body. This happens when a person loses more fluids than he or she takes in.  Treatment for this condition depends on how bad it is. Treatment should be started right away. Do not wait until your condition gets very bad.  Drink enough clear fluid to keep your pee pale yellow. If you were told to drink an oral rehydration solution (ORS), finish the ORS first. Then, start slowly drinking other clear fluids.    Take over-the-counter and prescription medicines only as told by your doctor.  Get help right away if you have any symptoms of very bad dehydration. This information is not intended to replace advice given to you by your health care provider. Make sure you discuss any questions you have with your health care provider. Document Revised: 02/02/2019 Document Reviewed: 02/02/2019 Elsevier Patient Education  2021 [employer]  

## 2020-12-06 ENCOUNTER — Ambulatory Visit
Admission: RE | Admit: 2020-12-06 | Discharge: 2020-12-06 | Disposition: A | Discharge status: Home / Self Care | Payer: 59 | Source: Ambulatory Visit | Attending: Radiation Oncology | Admitting: Radiation Oncology

## 2020-12-06 ENCOUNTER — Ambulatory Visit: Payer: 59 | Admitting: Dietician

## 2020-12-06 ENCOUNTER — Encounter: Payer: Self-pay | Admitting: Dietician

## 2020-12-06 ENCOUNTER — Encounter: Payer: Self-pay | Admitting: Hematology and Oncology

## 2020-12-06 DIAGNOSIS — R634 Abnormal weight loss: Secondary | ICD-10-CM | POA: Diagnosis not present

## 2020-12-06 MED ORDER — KATE FARMS STANDARD 1.4 EN LIQD
1950.0000 mL | Freq: Every day | ENTERAL | Status: DC
Start: 2020-12-06 — End: 2021-07-23

## 2020-12-06 NOTE — Progress Notes (Deleted)
Nutrition Follow-up:  Patient with nasopharyngeal cancer of soft palate. He is receiving current chemoradiation with Cisplatin. He is s/p G-tube on 4/20  Met with patient and wife in clinic on 6/1. Patient reported poor toleration to Osmolite 1.5 formula - endorsed nausea, stomach cramping followed by watery diarrhea within 10 minutes of each tube feeding. Patient given [employer] 1.4 samples to try. Received voicemail message from patient this morning. Voicemail left on 6/2 at 6:20 PM. Patient reports trying [employer] 1.4 formula the evening of 6/1 and had given 3 feedings yesterday. He reports having "no issues" and is tolerating this well. Will discontinue Osmolite and place new orders for [employer].   Estimated Energy Needs  Kcals: 2400-2600 Protein: 110-126  Fluid: 2.6 L  NUTRITION DIAGNOSIS: Food and nutrition related knowledge deficit continues    INTERVENTION:  Discontinue Osmolite 1.5 due to poor toleration  [employer] 1.4: Give 5 1/2 cartons split over four feedings. Flush with 60 ml water before and after each feeding. Drink by mouth or give via tube additional 3 1/2 cups water daily. This will provide 2503 kcal, 110 grams protein, 2600 ml total water.    MONITORING, EVALUATION, GOAL: weight trends, intake, tube feeding   NEXT VISIT:

## 2020-12-06 NOTE — Progress Notes (Signed)
Nutrition Follow-up:  Patient with nasopharyngeal cancer of soft palate. He is receiving current chemoradiation with Cisplatin. He is s/p G-tube on 4/20  Met with patient and wife in clinic on 6/1. Patient reported poor toleration to Osmolite 1.5 formula - endorsed nausea, stomach cramping followed by watery diarrhea within 10 minutes of each tube feeding. Patient given [employer] 1.4 samples to try. Received voicemail message from patient this morning. Voicemail left on 6/2 at 6:20 PM. Patient reports trying [employer] 1.4 formula the evening of 6/1 and had given 3 feedings yesterday. He reports having "no issues" and is tolerating this well. Will discontinue Osmolite and place new orders for [employer].   Estimated Energy Needs  Kcals: 2400-2600 Protein: 110-126  Fluid: 2.6 L  NUTRITION DIAGNOSIS: Food and nutrition related knowledge deficit continues    INTERVENTION:  Discontinue Osmolite 1.5 due to poor toleration  [employer] 1.4: Give 5 1/2 cartons split over four feedings. Flush with 60 ml water before and after each feeding. Drink by mouth or give via tube additional 3 1/2 cups water daily. This will provide 2503 kcal, 110 grams protein, 2600 ml total water.  New orders written and [place] notified   MONITORING, EVALUATION, GOAL: weight trends, intake, tube feeding   NEXT VISIT: Monday June 6 via telephone

## 2020-12-09 ENCOUNTER — Ambulatory Visit
Admission: RE | Admit: 2020-12-09 | Discharge: 2020-12-09 | Disposition: A | Discharge status: Home / Self Care | Payer: 59 | Source: Ambulatory Visit | Attending: Radiation Oncology | Admitting: Radiation Oncology

## 2020-12-09 ENCOUNTER — Inpatient Hospital Stay: Payer: 59 | Admitting: Nutrition

## 2020-12-09 ENCOUNTER — Other Ambulatory Visit: Payer: Self-pay

## 2020-12-09 DIAGNOSIS — R634 Abnormal weight loss: Secondary | ICD-10-CM | POA: Diagnosis not present

## 2020-12-09 NOTE — Progress Notes (Signed)
Nutrition follow-up after radiation therapy with patient and wife.  Patient is receiving concurrent chemoradiation therapy for nasopharyngeal cancer of the soft palate.  He is status post G-tube on April 20. Weight documented as 162.2 pounds June 6.  This is decreased from 167.2 pounds June 1. Patient is safe for a dysphagia 1/dysphagia 2 diet and reports he has been eating cereal and soup.  States he does not eat a lot but he is trying to eat better now that he feels better.  Reports his appetite has improved.  He denies nausea, vomiting, constipation, and diarrhea currently.  He recently switched to [facility] 1.4 via G-tube and has tolerated well.  New orders were written on Friday for patient to receive [facility] 1.4 instead of Osmolite 1.5.  He is requesting additional samples as he has run out of samples for now.  He does not want to use Osmolite 1.5 secondary to intolerance.  Estimated nutrition needs: 2400-2600 cal, 110-126 g protein, 2.6 L fluid.  Nutrition diagnosis: Food and nutrition related knowledge deficit improved.  Intervention: Increase [employer] 1.4 via G-tube to 3 cartons a day.  Use 60 mL free water flushes before and after bolus feeding.  Ensure patient giving formula at room temperature slowly by gravity.  Provided 4 cases of samples of [employer] 1.4 vanilla. (48 cartons).  Tube feeding will provide 1365 cal and 60 g of protein. Drink 1 carton of [employer] shake a day.  Provides 330 cal and 16 g protein.  Provided samples. Continue foods as tolerated.  Try to increase oral intake of soft foods as tolerated continue water by mouth. I will monitor weight and increase tube feeding/oral nutrition supplements/food intake to minimize weight loss.  Monitoring, evaluation, goals: Monitor weight and tolerance.  Next visit: Thursday, July 7.  Patient to contact RD if he develops questions or concerns prior to next visit.  He has my contact information.  **Disclaimer: This note was  dictated with voice recognition software. Similar sounding words can inadvertently be transcribed and this note may contain transcription errors which may not have been corrected upon publication of note.**

## 2020-12-10 ENCOUNTER — Encounter: Payer: Self-pay | Admitting: Radiation Oncology

## 2020-12-10 ENCOUNTER — Ambulatory Visit
Admission: RE | Admit: 2020-12-10 | Discharge: 2020-12-10 | Disposition: A | Discharge status: Home / Self Care | Payer: 59 | Source: Ambulatory Visit | Attending: Radiation Oncology | Admitting: Radiation Oncology

## 2020-12-10 DIAGNOSIS — R634 Abnormal weight loss: Secondary | ICD-10-CM | POA: Diagnosis not present

## 2020-12-11 ENCOUNTER — Other Ambulatory Visit: Payer: Self-pay

## 2020-12-11 ENCOUNTER — Ambulatory Visit
Admission: RE | Admit: 2020-12-11 | Discharge: 2020-12-11 | Disposition: A | Discharge status: Home / Self Care | Payer: 59 | Source: Ambulatory Visit | Attending: Radiation Oncology | Admitting: Radiation Oncology

## 2020-12-11 ENCOUNTER — Ambulatory Visit: Payer: 59

## 2020-12-11 DIAGNOSIS — R634 Abnormal weight loss: Secondary | ICD-10-CM | POA: Diagnosis not present

## 2020-12-12 ENCOUNTER — Ambulatory Visit
Admission: RE | Admit: 2020-12-12 | Discharge: 2020-12-12 | Disposition: A | Discharge status: Home / Self Care | Payer: 59 | Source: Ambulatory Visit | Attending: Radiation Oncology | Admitting: Radiation Oncology

## 2020-12-12 ENCOUNTER — Encounter: Payer: Self-pay | Admitting: Radiation Oncology

## 2020-12-12 ENCOUNTER — Encounter: Payer: Self-pay | Admitting: Hematology and Oncology

## 2020-12-12 ENCOUNTER — Inpatient Hospital Stay (HOSPITAL_BASED_OUTPATIENT_CLINIC_OR_DEPARTMENT_OTHER): Payer: 59 | Admitting: Hematology and Oncology

## 2020-12-12 ENCOUNTER — Inpatient Hospital Stay: Payer: 59

## 2020-12-12 DIAGNOSIS — K1231 Oral mucositis (ulcerative) due to antineoplastic therapy: Secondary | ICD-10-CM

## 2020-12-12 DIAGNOSIS — C119 Malignant neoplasm of nasopharynx, unspecified: Secondary | ICD-10-CM

## 2020-12-12 DIAGNOSIS — R634 Abnormal weight loss: Secondary | ICD-10-CM | POA: Diagnosis not present

## 2020-12-12 DIAGNOSIS — C113 Malignant neoplasm of anterior wall of nasopharynx: Secondary | ICD-10-CM

## 2020-12-12 DIAGNOSIS — Z5111 Encounter for antineoplastic chemotherapy: Secondary | ICD-10-CM | POA: Diagnosis not present

## 2020-12-12 LAB — CBC WITH DIFFERENTIAL (CANCER CENTER ONLY)
Abs Immature Granulocytes: 0.02 10*3/uL (ref 0.00–0.07)
Basophils Absolute: 0 10*3/uL (ref 0.0–0.1)
Basophils Relative: 0 %
Eosinophils Absolute: 0 10*3/uL (ref 0.0–0.5)
Eosinophils Relative: 0 %
HCT: 39.7 % (ref 39.0–52.0)
Hemoglobin: 13 g/dL (ref 13.0–17.0)
Immature Granulocytes: 1 %
Lymphocytes Relative: 4 %
Lymphs Abs: 0.1 10*3/uL — ABNORMAL LOW (ref 0.7–4.0)
MCH: 29.8 pg (ref 26.0–34.0)
MCHC: 32.7 g/dL (ref 30.0–36.0)
MCV: 91.1 fL (ref 80.0–100.0)
Monocytes Absolute: 0.5 10*3/uL (ref 0.1–1.0)
Monocytes Relative: 16 %
Neutro Abs: 2.5 10*3/uL (ref 1.7–7.7)
Neutrophils Relative %: 79 %
Platelet Count: 311 10*3/uL (ref 150–400)
RBC: 4.36 MIL/uL (ref 4.22–5.81)
RDW: 16.1 % — ABNORMAL HIGH (ref 11.5–15.5)
WBC Count: 3.1 10*3/uL — ABNORMAL LOW (ref 4.0–10.5)
nRBC: 0 % (ref 0.0–0.2)

## 2020-12-12 LAB — CMP (CANCER CENTER ONLY)
ALT: 12 U/L (ref 0–44)
AST: 13 U/L — ABNORMAL LOW (ref 15–41)
Albumin: 3.6 g/dL (ref 3.5–5.0)
Alkaline Phosphatase: 75 U/L (ref 38–126)
Anion gap: 11 (ref 5–15)
BUN: 25 mg/dL — ABNORMAL HIGH (ref 8–23)
CO2: 32 mmol/L (ref 22–32)
Calcium: 9.8 mg/dL (ref 8.9–10.3)
Chloride: 98 mmol/L (ref 98–111)
Creatinine: 1.6 mg/dL — ABNORMAL HIGH (ref 0.61–1.24)
GFR, Estimated: 48 mL/min — ABNORMAL LOW (ref >60)
Glucose, Bld: 168 mg/dL — ABNORMAL HIGH (ref 70–99)
Potassium: 3.8 mmol/L (ref 3.5–5.1)
Sodium: 141 mmol/L (ref 135–145)
Total Bilirubin: 0.3 mg/dL (ref 0.3–1.2)
Total Protein: 8 g/dL (ref 6.5–8.1)

## 2020-12-12 LAB — MAGNESIUM: Magnesium: 1.9 mg/dL (ref 1.7–2.4)

## 2020-12-12 NOTE — Progress Notes (Signed)
Oncology Nurse Navigator Documentation   Met with Mr. Paglia and his wife after final RT to offer support and to celebrate end of radiation treatment.   Provided verbal/written post-RT guidance: Importance of keeping all follow-up appts, especially those with Nutrition and SLP. Importance of protecting treatment area from sun. Continuation of Sonafine application 2-3 times daily, application of antibiotic ointment to areas of raw skin; when supply of Sonafine exhausted transition to OTC lotion with vitamin E. Provided/reviewed Epic calendar of upcoming appts. Explained my role as navigator will continue for several more months, encouraged him to call me with needs/concerns.    Harlow Asa RN, BSN, OCN Head & Neck Oncology Nurse [place] [facility] Phone # [phone]  Fax # [phone]

## 2020-12-12 NOTE — Assessment & Plan Note (Signed)
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   He complains of ongoing fatigue, loss of appetite, dysgeusia and weight loss.  Mucositis is not as bothersome, he is barely using viscous lidocaine. Physical examination today, mild mucositis, appeared to have lost weight, otherwise no major abnormalities.  CBC today showed ANC of 500, hence we cannot proceed with planned chemotherapy tomorrow.  He will however get some IV fluids tomorrow. He also has appointment with our dietitian today, encouraged him to at least try Ensure 3-4 times a day if he cannot tolerate Osmolite or follow-up on the dietitian recommendations. He had delay receiving cycle 5 of planned cisplatin because of cytopenias. He is now here for consideration of cycle 5. CBC satisfactory to proceed. CrCl dropped to 49 ml/min Hence we dose reduced to 50% of the original dose per recommendation, He will complete week 5 of cisplatin tomorrow. With regards to adjuvant chemotherapy, he may not be candidate for cisplatin if his crcl doesn't improve. He understands that creatinine impairment could be a result of multiple factors including cisplatin.

## 2020-12-12 NOTE — Progress Notes (Signed)
Per Dr Chryl Heck, ok to treat with elevated creatinine. Will dose-reduce cisplatin.

## 2020-12-12 NOTE — Assessment & Plan Note (Signed)
Mild, not on any pain medication He has viscous lidocaine for use as needed.

## 2020-12-12 NOTE — Progress Notes (Signed)
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   He complains of ongoing fatigue, loss of appetite, dysgeusia and weight loss.  Mucositis is not as bothersome, he is barely using viscous lidocaine. Physical examination today, mild mucositis, appeared to have lost weight, otherwise no major abnormalities.  CBC today showed ANC of 500, hence we cannot proceed with planned chemotherapy tomorrow.  He will however get some IV fluids tomorrow. He also has appointment with our dietitian today, encouraged him to at least try Ensure 3-4 times a day if he cannot tolerate Osmolite or follow-up on the dietitian recommendations. He had delay receiving cycle 5 of planned cisplatin because of cytopenias. He is now here for consideration of cycle 5. CBC satisfactory to proceed. CrCl dropped to 49 ml/min Hence we dose reduced to 50% of the original dose per recommendation, He will complete week 5 of cisplatin tomorrow. With regards to adjuvant chemotherapy, he may not be candidate for cisplatin if his crcl doesn't improve. He understands that creatinine impairment could be a result of multiple factors including cisplatin.  Weight loss, unintentional He lost about 7 lbs since last visit. He is now on K farm supplementation, using 2 to 2.5 cans, cant eat much except soup. He says its very hard to eat because of lack  of taste. He is being followed by nutrition team as well.  Mucositis due to antineoplastic therapy Mild, not on any pain medication He has viscous lidocaine for use as needed.  No orders of the defined types were placed in this encounter.    HISTORY OF PRESENTING ILLNESS:   Alan Leech Sr. 65 y.o. male is here because of SCC of nasopharyngeal origin, EBV neg.  Oncology History Overview Note  Alan Keesling. is a 65 y.o. male who presented with six-month history of gradually enlarging bilateral lymph nodes with associated mild discomfort and pressure.   Subsequently, the patient saw Dr. Wilburn Cornelia, who recommended CT scan of neck and ultrasound-guided needle core biopsy for soft tissue diagnosis.   Biopsy of right neck lymph node on 08/30/2020 revealed: squamous cell carcinoma, p16 positive. EBV ordered, negative.  He had CT soft tissue neck done on August 07, 2020 which showed nasopharyngeal soft tissue prominence, greatest soft tissue effacement of adjacent right parapharyngeal fat suspected to be at least enlarged right retropharyngeal lymph node.  Bulky bilateral cervical midline likely right intraparotid lymphadenopathy  PET/CT scan shows intense FDG uptake with area of increased soft tissue fullness in the posterior nasopharynx.  Extensive bulky bilateral FDG avid cervical adenopathy.  Large FDG avid lymph node also identified within the right parotid gland.  Imaging findings compatible with metastatic adenopathy.  Subcentimeter right supraclavicular lymph node exhibits mild FDG uptake of low background activity equivocal for nodal metastasis.  No additional signs of metastatic disease.  He had  right neck lymph node biopsy which is positive for squamous cell carcinoma, p16 positive, EBV negative  During his initial visit,  we recommended concurrent CRT followed by adjuvant chemotherapy. We discussed about every 21 day cisplatin, but he was very reluctant to proceed with  every 21 days cisplatin, hence now on weekly cisplatin.    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[place])  10/04/2020 Initial Diagnosis   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[place])    10/04/2020 Cancer Staging   Staging form: Pharynx - Nasopharynx, AJCC 8th Edition - Clinical stage from 10/04/2020: Stage IVA (cT1, cN3, cM0) - Signed by Eppie Gibson, MD on 10/04/2020  Stage prefix: Initial diagnosis    Nasopharynx cancer ([place])  10/08/2020 Initial Diagnosis   Nasopharynx cancer ([place])    10/31/2020 -  Chemotherapy    Patient is on Treatment Plan: HEAD/NECK CISPLATIN Q7D + XRT X 6 CYCLES / CISPLATIN D1 + 5FU IVCI D1-4 Q28D X 3 CYCLES        INTERIM HISTORY  Alan Henry is here for a follow-up for planned cycle 5 of chemotherapy. Since last visit, he lost about 7 lbs. He says his G tube formula was changed to K farm and this has helped tremendously, he has been using only 2 to 2.5 cans a day, drinks soup during the day. He says he is trying but cant eat without an actual appetite. He completed his radiation today, hoping to complete chemotherapy tomorrow. No change in breathing. No change in bowel habits or urinary habits No new neurological complaints.   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[place])    Pneumonia    Sleep apnea     SURGICAL HISTORY: Past Surgical History:  Procedure Laterality Date   FINE NEEDLE ASPIRATION BIOPSY     HERNIA REPAIR     Umbilicatl hernia   IR IMAGING GUIDED PORT INSERTION  10/18/2020   LAPAROSCOPIC INSERTION GASTROSTOMY TUBE N/A 10/23/2020   Procedure: LAPAROSCOPIC ASSISTED PEG TUBE;  Surgeon: Dwan Bolt, MD;  Location: [facility];  Service: General;  Laterality: N/A;  62   ROTATOR CUFF REPAIR     Torn Labrum      SOCIAL HISTORY: Social History   Socioeconomic History   Marital status: Married    Spouse name: Not on file   Number of  children: Not on file   Years of education: Not on file   Highest education level: Not on file  Occupational History   Not on file  Tobacco Use   Smoking status: Never   Smokeless tobacco: Never  Vaping Use   Vaping Use: Never used  Substance and Sexual Activity   Alcohol use: Not Currently    Comment: very rarely   Drug use: No   Sexual activity: Not Currently  Other Topics Concern   Not on file  Social History Narrative   Not on file   Social Determinants of Health   Financial Resource Strain: Not on file  Food Insecurity: No Food Insecurity   Worried About Running Out of Food in the Last Year: Never true   [place] in the Last Year: Never true  Transportation Needs: No Transportation Needs   Lack of Transportation (Medical): No   Lack of Transportation (Non-Medical): No  Physical Activity: Not on file  Stress: No Stress Concern Present   Feeling of Stress : Not at all  Social Connections: Socially Integrated   Frequency of Communication with Friends and Family: More  than three times a week   Frequency of Social Gatherings with Friends and Family: Twice a week   Attends Religious Services: More than 4 times per year   Active Member of [employer] or Organizations: Yes   Attends Archivist Meetings: 1 to 4 times per year   Marital Status: Married  Human resources officer Violence: Not on file    FAMILY HISTORY: No family history on file.  ALLERGIES:  has No Known Allergies.  MEDICATIONS:  Current Outpatient Medications  Medication Sig Dispense Refill   amLODipine (NORVASC) 10 MG tablet Take 10 mg by mouth daily.     atorvastatin (LIPITOR) 80 MG tablet Take 80 mg by mouth daily.     cetirizine (ZYRTEC) 10 MG tablet Take 10 mg by mouth daily.     dexamethasone (DECADRON) 4 MG tablet Take 2 tablets (8 mg total) by mouth daily. Take daily x 3 days starting the day after cisplatin chemotherapy. Take with food. 30 tablet 1   docusate sodium (COLACE) 100 MG capsule  Take 1 capsule (100 mg total) by mouth 2 (two) times daily. 60 capsule 0   ergocalciferol (VITAMIN D2) 1.25 MG (50000 UT) capsule Take 50,000 Units by mouth once a week. Wednesday     HYDROcodone-acetaminophen (HYCET) 7.5-325 mg/15 ml solution Take 10 mLs by mouth every 8 (eight) hours as needed for moderate pain. 473 mL 0   lidocaine (XYLOCAINE) 2 % solution Patient: Mix 1part 2% viscous lidocaine, 1part H20. Swish & swallow 35mL of diluted mixture, 47min before meals and at bedtime, up to QID 200 mL 3   lidocaine-prilocaine (EMLA) cream Apply to affected area once 30 g 3   LORazepam (ATIVAN) 0.5 MG tablet Take 1 tablet (0.5 mg total) by mouth every 6 (six) hours as needed (Nausea or vomiting). 30 tablet 0   nitroGLYCERIN (NITROSTAT) 0.4 MG SL tablet Place 0.4 mg under the tongue every 5 (five) minutes x 3 doses as needed for chest pain.     Nutritional Supplements (KATE FARMS STANDARD 1.4) LIQD 1,950 mLs by Enteral route daily.     omeprazole (PRILOSEC) 20 MG capsule Take 20 mg by mouth daily.     ondansetron (ZOFRAN) 8 MG tablet Take 1 tablet (8 mg total) by mouth 2 (two) times daily as needed. Start on the third day after cisplatin chemotherapy. 30 tablet 1   prochlorperazine (COMPAZINE) 10 MG tablet Take 1 tablet (10 mg total) by mouth every 6 (six) hours as needed (Nausea or vomiting). 30 tablet 1   sildenafil (VIAGRA) 50 MG tablet Take 50-100 mg by mouth daily as needed for erectile dysfunction.     No current facility-administered medications for this visit.    PHYSICAL EXAMINATION:  ECOG PERFORMANCE STATUS: 1 - Symptomatic but completely ambulatory  Vitals:   12/12/20 1050  BP: 117/85  Pulse: (!) 108  SpO2: 97%   Filed Weights   12/12/20 1050  Weight: 160 lb 6.4 oz (72.8 kg)    Physical Exam Constitutional:      Appearance: Normal appearance. He is not ill-appearing.     Comments: Appears to have lost weight   HENT:     Head: Normocephalic and atraumatic.      Mouth/Throat:     Mouth: Mucous membranes are moist.     Pharynx: Posterior oropharyngeal erythema present. No oropharyngeal exudate (mucositis better).  Cardiovascular:     Rate and Rhythm: Normal rate and regular rhythm.  Pulmonary:     Effort: Pulmonary effort is  normal.     Breath sounds: Normal breath sounds.  Abdominal:     General: Abdomen is flat. Bowel sounds are normal.     Palpations: Abdomen is soft.     Comments: G-tube in place, appears well without any evidence of infection  Musculoskeletal:        General: No swelling or tenderness. Normal range of motion.     Cervical back: Normal range of motion and neck supple. No rigidity.  Lymphadenopathy:     Cervical: Cervical adenopathy (palpable bilateral cervical lymphadenopathy but signifcantly improved overall) present.  Skin:    General: Skin is warm and dry.     Comments: Radiation dermatitis with some skin peeling noted   Neurological:     General: No focal deficit present.     Mental Status: He is alert.  Psychiatric:        Mood and Affect: Mood normal.        Behavior: Behavior normal.   LABORATORY DATA:  I have reviewed the data as listed Lab Results  Component Value Date   WBC 3.1 (L) 12/12/2020   HGB 13.0 12/12/2020   HCT 39.7 12/12/2020   MCV 91.1 12/12/2020   PLT 311 12/12/2020     Chemistry      Component Value Date/Time   NA 141 12/12/2020 1013   K 3.8 12/12/2020 1013   CL 98 12/12/2020 1013   CO2 32 12/12/2020 1013   BUN 25 (H) 12/12/2020 1013   CREATININE 1.60 (H) 12/12/2020 1013      Component Value Date/Time   CALCIUM 9.8 12/12/2020 1013   ALKPHOS 75 12/12/2020 1013   AST 13 (L) 12/12/2020 1013   ALT 12 12/12/2020 1013   BILITOT 0.3 12/12/2020 1013     Labs reviewed Crcl of 49 ml/min CBC satisfactory to proceed.  RADIOGRAPHIC STUDIES: I have personally reviewed the radiological images as listed and agreed with the findings in the report. No results found.  All questions were  answered. The patient knows to call the clinic with any problems, questions or concerns.    Benay Pike, MD 12/12/2020 6:41 PM

## 2020-12-12 NOTE — Assessment & Plan Note (Signed)
He lost about 7 lbs since last visit. He is now on K farm supplementation, using 2 to 2.5 cans, cant eat much except soup. He says its very hard to eat because of lack of taste. He is being followed by nutrition team as well.

## 2020-12-13 ENCOUNTER — Inpatient Hospital Stay: Payer: 59

## 2020-12-13 ENCOUNTER — Other Ambulatory Visit: Payer: Self-pay

## 2020-12-13 VITALS — BP 137/84 | HR 99 | Temp 98.6°F | Resp 17

## 2020-12-13 DIAGNOSIS — Z5111 Encounter for antineoplastic chemotherapy: Secondary | ICD-10-CM | POA: Diagnosis not present

## 2020-12-13 DIAGNOSIS — C119 Malignant neoplasm of nasopharynx, unspecified: Secondary | ICD-10-CM

## 2020-12-13 MED ORDER — HEPARIN SOD (PORK) LOCK FLUSH 100 UNIT/ML IV SOLN
500.0000 [IU] | Freq: Once | INTRAVENOUS | Status: AC | PRN
  Administered 2020-12-13: 500 [IU]
  Filled 2020-12-13: qty 5

## 2020-12-13 MED ORDER — SODIUM CHLORIDE 0.9 % IV SOLN
10.0000 mg | Freq: Once | INTRAVENOUS | Status: AC
  Administered 2020-12-13: 10 mg via INTRAVENOUS
  Filled 2020-12-13: qty 10

## 2020-12-13 MED ORDER — SODIUM CHLORIDE 0.9 % IV SOLN
150.0000 mg | Freq: Once | INTRAVENOUS | Status: AC
  Administered 2020-12-13: 150 mg via INTRAVENOUS
  Filled 2020-12-13: qty 150

## 2020-12-13 MED ORDER — MAGNESIUM SULFATE 2 GM/50ML IV SOLN
2.0000 g | Freq: Once | INTRAVENOUS | Status: AC
  Administered 2020-12-13: 2 g via INTRAVENOUS

## 2020-12-13 MED ORDER — SODIUM CHLORIDE 0.9 % IV SOLN
Freq: Once | INTRAVENOUS | Status: AC
  Filled 2020-12-13: qty 250

## 2020-12-13 MED ORDER — SODIUM CHLORIDE 0.9% FLUSH
10.0000 mL | INTRAVENOUS | Status: DC | PRN
  Administered 2020-12-13: 10 mL
  Filled 2020-12-13: qty 10

## 2020-12-13 MED ORDER — PALONOSETRON HCL INJECTION 0.25 MG/5ML
INTRAVENOUS | Status: AC
  Filled 2020-12-13: qty 5

## 2020-12-13 MED ORDER — SODIUM CHLORIDE 0.9 % IV SOLN
20.0000 mg/m2 | Freq: Once | INTRAVENOUS | Status: AC
  Administered 2020-12-13: 41 mg via INTRAVENOUS
  Filled 2020-12-13: qty 41

## 2020-12-13 MED ORDER — PALONOSETRON HCL INJECTION 0.25 MG/5ML
0.2500 mg | Freq: Once | INTRAVENOUS | Status: AC
  Administered 2020-12-13: 0.25 mg via INTRAVENOUS

## 2020-12-13 MED ORDER — MAGNESIUM SULFATE 2 GM/50ML IV SOLN
INTRAVENOUS | Status: AC
  Filled 2020-12-13: qty 50

## 2020-12-13 MED ORDER — POTASSIUM CHLORIDE IN NACL 20-0.9 MEQ/L-% IV SOLN
Freq: Once | INTRAVENOUS | Status: AC
  Filled 2020-12-13: qty 1000

## 2020-12-13 NOTE — Patient Instructions (Signed)
[facility]COL[facility]  Discharge Instructions: Thank you for choosing [facility] to provide your oncology and hematology care.   If you have a lab appointment with the Cancer Center, please go directly to the Cancer Center and check in at the registration area.   Wear comfortable clothing and clothing appropriate for easy access to any Portacath or PICC line.   We strive to give you quality time with your provider. You may need to reschedule your appointment if you arrive late (15 or more minutes).  Arriving late affects you and other patients whose appointments are after yours.  Also, if you miss three or more appointments without notifying the office, you may be dismissed from the clinic at the provider's discretion.      For prescription refill requests, have your pharmacy contact our office and allow 72 hours for refills to be completed.    Today you received the following chemotherapy and/or immunotherapy agents : Cisplatin    To help prevent nausea and vomiting after your treatment, we encourage you to take your nausea medication as directed.  BELOW ARE SYMPTOMS THAT SHOULD BE REPORTED IMMEDIATELY: *FEVER GREATER THAN 100.4 F (38 C) OR HIGHER *CHILLS OR SWEATING *NAUSEA AND VOMITING THAT IS NOT CONTROLLED WITH YOUR NAUSEA MEDICATION *UNUSUAL SHORTNESS OF BREATH *UNUSUAL BRUISING OR BLEEDING *URINARY PROBLEMS (pain or burning when urinating, or frequent urination) *BOWEL PROBLEMS (unusual diarrhea, constipation, pain near the anus) TENDERNESS IN MOUTH AND THROAT WITH OR WITHOUT PRESENCE OF ULCERS (sore throat, sores in mouth, or a toothache) UNUSUAL RASH, SWELLING OR PAIN  UNUSUAL VAGINAL DISCHARGE OR ITCHING   Items with * indicate a potential emergency and should be followed up as soon as possible or go to the Emergency Department if any problems should occur.  Please show the CHEMOTHERAPY ALERT CARD or IMMUNOTHERAPY ALERT CARD at check-in to  the Emergency Department and triage nurse.  Should you have questions after your visit or need to cancel or reschedule your appointment, please contact [facility]  Dept: [phone]  and follow the prompts.  Office hours are 8:00 a.m. to 4:30 p.m. Monday - Friday. Please note that voicemails left after 4:00 p.m. may not be returned until the following business day.  We are closed weekends and major holidays. You have access to a nurse at all times for urgent questions. Please call the main number to the clinic Dept: [phone] and follow the prompts.   For any non-urgent questions, you may also contact your provider using MyChart. We now offer e-Visits for anyone 18 and older to request care online for non-urgent symptoms. For details visit [email or web address].   Also download the MyChart app! Go to the app store, search "MyChart", open the app, select [facility], and log in with your MyChart username and password.  Due to Covid, a mask is required upon entering the hospital/clinic. If you do not have a mask, one will be given to you upon arrival. For doctor visits, patients may have 1 support person aged 18 or older with them. For treatment visits, patients cannot have anyone with them due to current Covid guidelines and our immunocompromised population.   

## 2020-12-13 NOTE — Progress Notes (Signed)
Patient unable to produce 200 mL urine output. Received a verbal order for Dr. Burr Medico to administer a 597mL NS bolus over 30 minutes and then if patient still does not have the 279mL urinary output to proceed with treatment.

## 2020-12-27 ENCOUNTER — Ambulatory Visit
Admission: RE | Admit: 2020-12-27 | Discharge: 2020-12-27 | Disposition: A | Discharge status: Home / Self Care | Payer: 59 | Source: Ambulatory Visit | Attending: Radiation Oncology | Admitting: Radiation Oncology

## 2020-12-27 ENCOUNTER — Other Ambulatory Visit: Payer: Self-pay

## 2020-12-27 VITALS — BP 120/76 | HR 108 | Temp 96.7°F | Resp 18 | Ht 69.0 in | Wt 159.1 lb

## 2020-12-27 DIAGNOSIS — C118 Malignant neoplasm of overlapping sites of nasopharynx: Secondary | ICD-10-CM | POA: Insufficient documentation

## 2020-12-27 DIAGNOSIS — Z79899 Other long term (current) drug therapy: Secondary | ICD-10-CM | POA: Insufficient documentation

## 2020-12-27 DIAGNOSIS — C113 Malignant neoplasm of anterior wall of nasopharynx: Secondary | ICD-10-CM

## 2020-12-27 DIAGNOSIS — C119 Malignant neoplasm of nasopharynx, unspecified: Secondary | ICD-10-CM

## 2020-12-27 DIAGNOSIS — Z923 Personal history of irradiation: Secondary | ICD-10-CM | POA: Diagnosis not present

## 2020-12-27 NOTE — Progress Notes (Signed)
Radiation Oncology         [phone]) [phone] ________________________________  Name: Idelle Leech Sr. MRN: [id number]  Date: 12/27/2020  DOB: 1955/08/30  Follow-Up Visit Note  CC: Nolene Ebbs, MD  [name], MD  Diagnosis and Prior Radiotherapy:    C11.3   ICD-10-CM   1. Nasopharyngeal cancer ([place])  C11.9     2. 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       CHIEF COMPLAINT:  Here for follow-up and surveillance of nasopharyngeal cancer  Narrative:  The patient returns today for routine follow-up.  Mr. Costlow presents today for 2 week follow-up after completing radiation to his nasopharynx on 12/12/2020  Pain issues, if any: Reports throat discomfort is improving Using a feeding tube?: Yes--denies any issues or concerns. Continuing to instill [employer] supplement as directed. Weight changes, if any:  Wt Readings from Last 3 Encounters:  12/27/20 159 lb 2 oz (72.2 kg)  12/12/20 160 lb 6.4 oz (72.8 kg)  12/04/20 167 lb 3.2 oz (75.8 kg)   Swallowing issues, if any: Slowly starting to try more solid foods, and states as long as he takes small bites he doesn't have any issue Smoking or chewing tobacco? None Using fluoride trays daily? N/A--does reports mild tenderness to upper gum line.  Last ENT visit was on: Not since diagnosis Other notable issues, if any: Reports slight improvement in dry mouth and thick saliva (states both are more prominent in the mornings, but improve after garggling/blowing nose). Reports today is the first day he feels he has more energy and in better spirits. Denies any ear or jaw pain, or difficulty opening his mouth. Does reports bilateral popping of ears that is unsettling (denies any fullness or drainage) Reports intermitent constipation, but reports he is passing gas frequently. Urinating regularly but states urine is occasionally darker in color (denies dysuria). Skin appears well healed in treatment field  Vitals:   12/27/20 1050  BP:  120/76  Pulse: (!) 108  Resp: 18  Temp: (!) 96.7 F (35.9 C)  SpO2: 100%                       ALLERGIES:  has No Known Allergies.  Meds: Current Outpatient Medications  Medication Sig Dispense Refill   amLODipine (NORVASC) 10 MG tablet Take 10 mg by mouth daily.     atorvastatin (LIPITOR) 80 MG tablet Take 80 mg by mouth daily.     cetirizine (ZYRTEC) 10 MG tablet Take 10 mg by mouth daily.     dexamethasone (DECADRON) 4 MG tablet Take 2 tablets (8 mg total) by mouth daily. Take daily x 3 days starting the day after cisplatin chemotherapy. Take with food. 30 tablet 1   docusate sodium (COLACE) 100 MG capsule Take 1 capsule (100 mg total) by mouth 2 (two) times daily. 60 capsule 0   ergocalciferol (VITAMIN D2) 1.25 MG (50000 UT) capsule Take 50,000 Units by mouth once a week. Wednesday     HYDROcodone-acetaminophen (HYCET) 7.5-325 mg/15 ml solution Take 10 mLs by mouth every 8 (eight) hours as needed for moderate pain. 473 mL 0   lidocaine (XYLOCAINE) 2 % solution Patient: Mix 1part 2% viscous lidocaine, 1part H20. Swish & swallow 85mL of diluted mixture, 34min before meals and at bedtime, up to QID 200 mL 3   lidocaine-prilocaine (EMLA) cream Apply to affected area once 30 g 3   LORazepam (ATIVAN) 0.5 MG tablet Take 1 tablet (0.5 mg  total) by mouth every 6 (six) hours as needed (Nausea or vomiting). 30 tablet 0   nitroGLYCERIN (NITROSTAT) 0.4 MG SL tablet Place 0.4 mg under the tongue every 5 (five) minutes x 3 doses as needed for chest pain.     Nutritional Supplements (KATE FARMS STANDARD 1.4) LIQD 1,950 mLs by Enteral route daily.     omeprazole (PRILOSEC) 20 MG capsule Take 20 mg by mouth daily.     ondansetron (ZOFRAN) 8 MG tablet Take 1 tablet (8 mg total) by mouth 2 (two) times daily as needed. Start on the third day after cisplatin chemotherapy. 30 tablet 1   prochlorperazine (COMPAZINE) 10 MG tablet Take 1 tablet (10 mg total) by mouth every 6 (six) hours as needed (Nausea or  vomiting). 30 tablet 1   sildenafil (VIAGRA) 50 MG tablet Take 50-100 mg by mouth daily as needed for erectile dysfunction.     No current facility-administered medications for this encounter.    Physical Findings: The patient is in no acute distress. Patient is alert and oriented. Wt Readings from Last 3 Encounters:  12/27/20 159 lb 2 oz (72.2 kg)  12/12/20 160 lb 6.4 oz (72.8 kg)  12/04/20 167 lb 3.2 oz (75.8 kg)    height is 5\' 9"  (1.753 m) and weight is 159 lb 2 oz (72.2 kg). His temporal temperature is 96.7 F (35.9 C) (abnormal). His blood pressure is 120/76 and his pulse is 108 (abnormal). His respiration is 18 and oxygen saturation is 100%. .  General: Alert and oriented, in no acute distress HEENT: Head is normocephalic. Extraocular movements are intact. Right tympanic membrane notable for apparent effusion/scarring but no bleeding. Left TM WNL. Oropharynx is notable for no lesions in mouth or throat, clear mucosa Neck: Neck is notable for continued regression of neck adenopathy, dominant mass in right level 2 is mobile and approx 2-2.5cm  Skin: Skin in treatment fields shows satisfactory healing  Psychiatric: Judgment and insight are intact. Affect is appropriate.   Lab Findings: Lab Results  Component Value Date   WBC 3.1 (L) 12/12/2020   HGB 13.0 12/12/2020   HCT 39.7 12/12/2020   MCV 91.1 12/12/2020   PLT 311 12/12/2020    Lab Results  Component Value Date   TSH 0.818 10/08/2020    Radiographic Findings: No results found.  Impression/Plan:    1) Head and Neck Cancer Status: healing well from ChRT  2) Nutritional Status: stabilizing, tolerating new supplements well PEG tube: using  3) Risk Factors: The patient has been educated about risk factors including alcohol and tobacco abuse; they understand that avoidance of alcohol and tobacco is important to prevent recurrences as well as other cancers  4) Swallowing: continue SLP exercises, wean off tube as  tolerated  5) Dental: Encouraged to continue regular followup with dentistry, and dental hygiene including fluoride rinses.   6) Thyroid function: check annually Lab Results  Component Value Date   TSH 0.818 10/08/2020    7) Other - refer back to ENT for tympanic membrane irregularity, inner ear symptoms   8)   Anderson Malta, our navigator, will arrange restaging PET scan in mid September with a follow-up to see me the day after.  She will also arrange an appointment in the next 1 to 3 weeks with ENT as the patient is having inner ear symptoms and shows effusion /scarring over the right tympanic membrane.   On date of service, in total, I spent 25 minutes on this encounter. Patient was seen  in person. _____________________________________   Eppie Gibson, MD

## 2020-12-27 NOTE — Progress Notes (Signed)
Mr. Fluegge presents today for 2 week follow-up after completing radiation to his nasopharynx on 12/12/2020  Pain issues, if any: Reports throat discomfort is improving Using a feeding tube?: Yes--denies any issues or concerns. Continuing to instill [employer] supplement as directed. Weight changes, if any:  Wt Readings from Last 3 Encounters:  12/27/20 159 lb 2 oz (72.2 kg)  12/12/20 160 lb 6.4 oz (72.8 kg)  12/04/20 167 lb 3.2 oz (75.8 kg)   Swallowing issues, if any: Slowly starting to try more solid foods, and states as long as he takes small bites he doesn't have any issue Smoking or chewing tobacco? None Using fluoride trays daily? N/A--does reports mild tenderness to upper gum line.  Last ENT visit was on: Not since diagnosis Other notable issues, if any: Reports slight improvement in dry mouth and thick saliva (states both are more prominent in the mornings, but improve after garggling/blowing nose). Reports today is the first day he feels he has more energy and in better spirits. Denies any ear or jaw pain, or difficulty opening his mouth. Does reports bilateral popping of ears that is unsettling (denies any fullness or drainage) Reports intermitent constipation, but reports he is passing gas frequently. Urinating regularly but states urine is occasionally darker in color (denies dysuria). Skin appears well healed in treatment field  Vitals:   12/27/20 1050  BP: 120/76  Pulse: (!) 108  Resp: 18  Temp: (!) 96.7 F (35.9 C)  SpO2: 100%

## 2020-12-30 ENCOUNTER — Ambulatory Visit: Payer: 59 | Admitting: Physical Therapy

## 2020-12-30 ENCOUNTER — Other Ambulatory Visit: Payer: Self-pay

## 2020-12-30 ENCOUNTER — Encounter: Payer: Self-pay | Admitting: Physical Therapy

## 2020-12-30 DIAGNOSIS — R293 Abnormal posture: Secondary | ICD-10-CM

## 2020-12-30 DIAGNOSIS — C119 Malignant neoplasm of nasopharynx, unspecified: Secondary | ICD-10-CM

## 2020-12-30 DIAGNOSIS — R1310 Dysphagia, unspecified: Secondary | ICD-10-CM | POA: Diagnosis not present

## 2020-12-30 DIAGNOSIS — C113 Malignant neoplasm of anterior wall of nasopharynx: Secondary | ICD-10-CM

## 2020-12-30 NOTE — Therapy (Signed)
[place], [place], [address] Phone: [phone]   Fax:  [phone]  Physical Therapy Treatment  Patient Details  Name: MANSON LUCKADOO Sr. 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   Subjective Assessment - 12/30/20 0905     Subjective I am coming along. I am not really having any pain. I had some swelling in my throat but it has been going down. My activity level has started to pick up over the last week. At the end of radiation and chemo it was tough. I switched to [place] and a lot of the symptoms I was having improved. Last week I felt like I was starting to turn a corner. I have just started to try and eat solid foods. I have been trying more chicken and fish but my taste is still signficantly reduced. I lost 26 pounds total.    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 There was soft tissue effacement of the adjacent right parapharyngeal fat, suspected to at least partially reflect an enlarged right retropharyngeal lymph node. Additionally, there was bulk bilateral cervical, and likely right intraparotid, lymphadenopathy, 08/30/20 US guided biopsy revealed squamous carcinoma, p 16 +, EBV -, 3/25 PET revealed intense FDG uptake within the area of increased soft tissue fullness in the posterior nasopharynx. Primary nasopharyngeal neoplasm could not be excluded. Additionally, there was extensive, bulky, bilateral FDG avid cervical adenopathy. A large FDG avid lymph node was also identified within the right parotid gland. Imaging findings were compatible with metastatic adenopathy. Finally, there was a sub-centimeter right supraclavicular lymph node that exhibited mild FDG uptake above background activity, equivocal for nodal metastasis. There were no additional signs of thoracic, abdominal, or pelvic metastasis. There was no evidence for osseous metastatic disease, will receive 35 fractions to his nasopharyngeal area and bilateral neck with weekly cisplatin ([place]). He started on 10/24/20 and will complete on 12/12/20, 10/18/20 PAC placed. PEG to  be placed by surgery on 4/20    Patient Stated Goals to gain info from providers    Currently in Pain? No/denies    Pain Score 0-No pain                [facility] PT Assessment - 12/30/20 0001       Balance Screen   Has the patient fallen in the past 6 months No      Prior Function   Level of Independence Independent      Cognition   Overall Cognitive Status Within Functional Limits for tasks assessed      Sit to Stand   Comments 30 sec sit to stand: 18 reps in 30 sec which is nearly excellent for his  age      AROM   Cervical Flexion WFL    Cervical Extension WFL    Cervical - Right Side Bend WFL    Cervical - Left Side Bend WFL    Cervical - Right Rotation [facility]   felt some tightness   Cervical - Left Rotation [facility]      Ambulation/Gait   Ambulation Distance (Feet) 10 Feet    Gait Pattern Poor foot clearance - left;Poor foot clearance - right   pt reports that it is due to the shoes he is wearing today and is not normally an issue              LYMPHEDEMA/ONCOLOGY QUESTIONNAIRE - 12/30/20 0001       Head and Neck   4 cm superior to sternal notch around neck 38.5 cm    6 cm superior to sternal notch around neck 37.7 cm    8 cm superior to sternal notch around neck 38.4 cm                                     PT Long Term Goals - 12/30/20 0948       PT LONG TERM GOAL #1   Title Pt will return to baseline ROM measurements and not demonstrate any signs or symptoms of lymphedema.    Time 8    Period Weeks    Status Achieved                   Plan - 12/30/20 0951     Clinical Impression Statement Pt returns to PT after completing chemo and radiation for nasopharyngeal cancer. He lost nearly 30 lbs during the course of treatment. Pt reports in the last week he has turned a corner and has been increasing his activity level and has been walking and eating more solid foods. At this time he does not demonstrate any signs of lymphedema and his neck ROM is [facility]. Pt reports he has not been doing neck stretches so re educated pt and issued handout with HEP. Pt has also returned to work with limited hours. His 30 sec sit to stand was nearly excellent for his age. He did have some decreased foot clearance when walking back to treatment room but pt reports this is due to his shoes and not normally an issue. Educated pt that if he notices any swelling to let his doctor know so he can be referred back to PT.    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[place])     Problem List Patient Active Problem List   Diagnosis Date Noted   Chemotherapy induced neutropenia ([place]) 11/27/2020   Mucositis due to antineoplastic therapy 11/13/2020   Chemotherapy induced nausea and vomiting 11/06/2020   Weight loss, unintentional 11/06/2020   Constipation 11/06/2020   Port-A-Cath in place 10/31/2020   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 MRN: [id number] Date of Birth: 05/15/1956   Allyson Sabal [place], PT 12/30/20 9:57 AM  PHYSICAL THERAPY DISCHARGE SUMMARY  Visits from Start of Care: 2  Current functional level related to goals / functional outcomes: All goals met   Remaining deficits: None   Education / Equipment: HEP   Patient agrees to discharge. Patient goals were met. Patient is being discharged due to meeting the stated rehab goals.

## 2021-01-07 ENCOUNTER — Other Ambulatory Visit: Payer: Self-pay | Admitting: Hematology and Oncology

## 2021-01-08 ENCOUNTER — Encounter: Payer: Self-pay | Admitting: Hematology and Oncology

## 2021-01-08 ENCOUNTER — Other Ambulatory Visit: Payer: Self-pay

## 2021-01-08 ENCOUNTER — Inpatient Hospital Stay (HOSPITAL_BASED_OUTPATIENT_CLINIC_OR_DEPARTMENT_OTHER): Payer: 59 | Admitting: Hematology and Oncology

## 2021-01-08 ENCOUNTER — Inpatient Hospital Stay: Payer: 59 | Attending: Hematology and Oncology

## 2021-01-08 DIAGNOSIS — K1231 Oral mucositis (ulcerative) due to antineoplastic therapy: Secondary | ICD-10-CM | POA: Diagnosis not present

## 2021-01-08 DIAGNOSIS — R5383 Other fatigue: Secondary | ICD-10-CM | POA: Insufficient documentation

## 2021-01-08 DIAGNOSIS — R634 Abnormal weight loss: Secondary | ICD-10-CM | POA: Insufficient documentation

## 2021-01-08 DIAGNOSIS — C113 Malignant neoplasm of anterior wall of nasopharynx: Secondary | ICD-10-CM | POA: Insufficient documentation

## 2021-01-08 DIAGNOSIS — D701 Agranulocytosis secondary to cancer chemotherapy: Secondary | ICD-10-CM | POA: Insufficient documentation

## 2021-01-08 DIAGNOSIS — T451X5D Adverse effect of antineoplastic and immunosuppressive drugs, subsequent encounter: Secondary | ICD-10-CM | POA: Insufficient documentation

## 2021-01-08 DIAGNOSIS — T451X5A Adverse effect of antineoplastic and immunosuppressive drugs, initial encounter: Secondary | ICD-10-CM

## 2021-01-08 DIAGNOSIS — C119 Malignant neoplasm of nasopharynx, unspecified: Secondary | ICD-10-CM

## 2021-01-08 LAB — CMP (CANCER CENTER ONLY)
ALT: 9 U/L (ref 0–44)
AST: 14 U/L — ABNORMAL LOW (ref 15–41)
Albumin: 3.6 g/dL (ref 3.5–5.0)
Alkaline Phosphatase: 64 U/L (ref 38–126)
Anion gap: 8 (ref 5–15)
BUN: 20 mg/dL (ref 8–23)
CO2: 29 mmol/L (ref 22–32)
Calcium: 9.5 mg/dL (ref 8.9–10.3)
Chloride: 103 mmol/L (ref 98–111)
Creatinine: 1.21 mg/dL (ref 0.61–1.24)
GFR, Estimated: >60 mL/min (ref >60)
Glucose, Bld: 119 mg/dL — ABNORMAL HIGH (ref 70–99)
Potassium: 3.7 mmol/L (ref 3.5–5.1)
Sodium: 140 mmol/L (ref 135–145)
Total Bilirubin: 0.5 mg/dL (ref 0.3–1.2)
Total Protein: 7.2 g/dL (ref 6.5–8.1)

## 2021-01-08 LAB — CBC WITH DIFFERENTIAL (CANCER CENTER ONLY)
Abs Immature Granulocytes: 0.01 10*3/uL (ref 0.00–0.07)
Basophils Absolute: 0 10*3/uL (ref 0.0–0.1)
Basophils Relative: 1 %
Eosinophils Absolute: 0 10*3/uL (ref 0.0–0.5)
Eosinophils Relative: 1 %
HCT: 35.5 % — ABNORMAL LOW (ref 39.0–52.0)
Hemoglobin: 11.7 g/dL — ABNORMAL LOW (ref 13.0–17.0)
Immature Granulocytes: 0 %
Lymphocytes Relative: 6 %
Lymphs Abs: 0.3 10*3/uL — ABNORMAL LOW (ref 0.7–4.0)
MCH: 29.9 pg (ref 26.0–34.0)
MCHC: 33 g/dL (ref 30.0–36.0)
MCV: 90.8 fL (ref 80.0–100.0)
Monocytes Absolute: 0.4 10*3/uL (ref 0.1–1.0)
Monocytes Relative: 10 %
Neutro Abs: 3.4 10*3/uL (ref 1.7–7.7)
Neutrophils Relative %: 82 %
Platelet Count: 155 10*3/uL (ref 150–400)
RBC: 3.91 MIL/uL — ABNORMAL LOW (ref 4.22–5.81)
RDW: 17.5 % — ABNORMAL HIGH (ref 11.5–15.5)
WBC Count: 4.2 10*3/uL (ref 4.0–10.5)
nRBC: 0 % (ref 0.0–0.2)

## 2021-01-08 LAB — MAGNESIUM: Magnesium: 1.9 mg/dL (ref 1.7–2.4)

## 2021-01-08 NOTE — Assessment & Plan Note (Signed)
Weight loss secondary to chemo/radiation.  This has improved since last visit.  He will continue on [facility] for G-tube supplements and work on oral feeding.

## 2021-01-08 NOTE — Assessment & Plan Note (Signed)
Resolved

## 2021-01-08 NOTE — Assessment & Plan Note (Signed)
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 We have discussed about the Intergroup 0099 trial which used concurrent chemoradiation with adjuvant chemo versus radiation alone.  We have discussed that the survival benefit was greater in the concurrent chemoradiotherapy arm and adjuvant chemotherapy.  We also discussed about some trials using adjuvant Xeloda for about 8 cycles.  This study has shown failure free survival benefit but overall survival data is pending.  We have discussed about adverse effects with Xeloda as well.  I tried to explain to him that we do not know the exact benefit loss from eliminating adjuvant chemotherapy but survival data is not very clear with addition of adjuvant chemotherapy.  He was given information about Xeloda.  He would like to think about it but most likely he will not like to proceed with chemotherapy.  In that case he will return to clinic in about 4 weeks or sooner as he was instructed.

## 2021-01-08 NOTE — Progress Notes (Signed)
Alan Henry FOLLOW UP NOTE  Patient Care Team: Nolene Ebbs, MD as PCP - General (Internal Medicine) Nolene Ebbs, MD (Internal Medicine)  CHIEF COMPLAINTS/PURPOSE OF CONSULTATION:  Follow up after completion of chemoradiation.  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 We have discussed about the Intergroup 0099 trial which used concurrent chemoradiation with adjuvant chemo versus radiation alone.  We have discussed that the survival benefit was greater in the concurrent chemoradiotherapy arm and adjuvant chemotherapy.  We also discussed about some trials using adjuvant Xeloda for about 8 cycles.  This study has shown failure free survival benefit but overall survival data is pending.  We have discussed about adverse effects with Xeloda as well.  I tried to explain to him that we do not know the exact benefit loss from eliminating adjuvant chemotherapy but survival data is not very clear with addition of adjuvant chemotherapy.  He was given information about Xeloda.  He would like to think about it but most likely he will not like to proceed with chemotherapy.  In that case he will return to clinic in about 4 weeks or sooner as he was instructed.  Weight loss, unintentional Weight loss secondary to chemo/radiation.  This has improved since last visit.  He will continue on [facility] for G-tube supplements and work on oral feeding.  Mucositis due to antineoplastic therapy Resolved  Chemotherapy induced neutropenia (HCC) Resolved.    HISTORY OF PRESENTING ILLNESS:   Alan Leech Sr. 65 y.o. male is here because of SCC of nasopharyngeal origin, EBV neg.  Oncology History Overview Note   Alan Crossan. is a 65 y.o. male who presented with six-month history of gradually enlarging bilateral lymph nodes with associated mild discomfort and pressure.   Subsequently, the patient saw Dr. Wilburn Cornelia, who recommended CT scan of neck and ultrasound-guided needle core biopsy for soft tissue diagnosis.   Biopsy of right neck lymph node on 08/30/2020 revealed: squamous cell carcinoma, p16 positive. EBV ordered, negative.  He had CT soft tissue neck done on August 07, 2020 which showed nasopharyngeal soft tissue prominence, greatest soft tissue effacement of adjacent right parapharyngeal fat suspected to be at least enlarged right retropharyngeal lymph node.  Bulky bilateral cervical midline likely right intraparotid lymphadenopathy  PET/CT scan shows intense FDG uptake with area of increased soft tissue fullness in the posterior nasopharynx.  Extensive bulky bilateral FDG avid cervical adenopathy.  Large FDG avid lymph node also identified within the right parotid gland.  Imaging findings compatible with metastatic adenopathy.  Subcentimeter right supraclavicular lymph node exhibits mild FDG uptake of low background activity equivocal for nodal metastasis.  No additional signs of metastatic disease.  He had  right neck lymph node biopsy which is positive for squamous cell carcinoma, p16 positive, EBV negative  During his initial visit, we recommended concurrent CRT followed by adjuvant chemotherapy. We discussed about every 21 day cisplatin, but he was very reluctant to proceed with every 21 days cisplatin, hence now on weekly cisplatin.    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  Since his last visit, he has been feeling better.   He has started gaining some weight.   He continues on G-tube feedings, can eat some solid food.   Mucositis is resolving, pain has significantly improved in his mouth and throat.   No change in breathing.  No change in bowel habits.  No change in urinary habits. He is very reluctant about proceeding with any additional chemotherapy. Rest of the pertinent 10 point ROS reviewed and negative.  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[place])    Pneumonia    Sleep apnea     SURGICAL HISTORY: Past Surgical History:  Procedure Laterality Date   FINE NEEDLE ASPIRATION BIOPSY     HERNIA REPAIR     Umbilicatl hernia   IR IMAGING GUIDED PORT INSERTION  10/18/2020   LAPAROSCOPIC INSERTION GASTROSTOMY TUBE N/A 10/23/2020   Procedure: LAPAROSCOPIC ASSISTED PEG TUBE;  Surgeon: Dwan Bolt, MD;  Location: [facility];  Service: General;  Laterality: N/A;  48   ROTATOR CUFF REPAIR     Torn Labrum      SOCIAL HISTORY: Social History   Socioeconomic History   Marital status: Married    Spouse name: Not on file   Number of children: Not on file   Years of education: Not on file   Highest education level: Not on file  Occupational History   Not on file  Tobacco Use   Smoking status: Never   Smokeless tobacco: Never  Vaping Use   Vaping Use: Never used  Substance and Sexual Activity   Alcohol use: Not Currently    Comment: very rarely   Drug use: No   Sexual activity: Not Currently  Other Topics Concern   Not on  file  Social History Narrative   Not on file   Social Determinants of Health   Financial Resource Strain: Not on file  Food Insecurity: No Food Insecurity   Worried About Running Out of Food in the Last Year: Never true   [place] in the Last Year: Never true  Transportation Needs: No Transportation Needs   Lack of Transportation (Medical): No   Lack of Transportation (Non-Medical): No  Physical Activity: Not on file  Stress: No Stress Concern Present   Feeling of Stress : Not at all  Social Connections: Socially Integrated   Frequency of Communication with Friends and Family: More than three times a week   Frequency of Social Gatherings with Friends and Family: Twice a week   Attends Religious Services: More than 4 times per year   Active Member of [employer] or Organizations: Yes   Attends Archivist Meetings: 1 to 4 times per year   Marital Status: Married  Human resources officer Violence: Not on file    FAMILY HISTORY: No family history on file.  ALLERGIES:  has  No Known Allergies.  MEDICATIONS:  Current Outpatient Medications  Medication Sig Dispense Refill   amLODipine (NORVASC) 10 MG tablet Take 10 mg by mouth daily.     atorvastatin (LIPITOR) 80 MG tablet Take 80 mg by mouth daily.     cetirizine (ZYRTEC) 10 MG tablet Take 10 mg by mouth daily.     docusate sodium (COLACE) 100 MG capsule Take 1 capsule (100 mg total) by mouth 2 (two) times daily. 60 capsule 0   ergocalciferol (VITAMIN D2) 1.25 MG (50000 UT) capsule Take 50,000 Units by mouth once a week. Wednesday     lidocaine (XYLOCAINE) 2 % solution Patient: Mix 1part 2% viscous lidocaine, 1part H20. Swish & swallow 7mL of diluted mixture, 71min before meals and at bedtime, up to QID 200 mL 3   lidocaine-prilocaine (EMLA) cream Apply to affected area once 30 g 3   nitroGLYCERIN (NITROSTAT) 0.4 MG SL tablet Place 0.4 mg under the tongue every 5 (five) minutes x 3 doses as needed for chest pain.      Nutritional Supplements ([employer] STANDARD 1.4) LIQD 1,950 mLs by Enteral route daily.     omeprazole (PRILOSEC) 20 MG capsule Take 20 mg by mouth daily.     sildenafil (VIAGRA) 50 MG tablet Take 50-100 mg by mouth daily as needed for erectile dysfunction.     No current facility-administered medications for this visit.    PHYSICAL EXAMINATION:  ECOG PERFORMANCE STATUS: 1 - Symptomatic but completely ambulatory  Vitals:   01/08/21 1013  BP: (!) 157/86  Pulse: 67  Resp: 17  Temp: 98.8 F (37.1 C)  SpO2: 100%   Filed Weights   01/08/21 1013  Weight: 162 lb 11.2 oz (73.8 kg)    Physical Exam Constitutional:      Appearance: Normal appearance. He is not ill-appearing.     Comments: Appears to have lost weight   HENT:     Head: Normocephalic and atraumatic.     Mouth/Throat:     Mouth: Mucous membranes are moist.     Pharynx: No oropharyngeal exudate or posterior oropharyngeal erythema (mucositis resolved).  Cardiovascular:     Rate and Rhythm: Normal rate and regular rhythm.  Pulmonary:     Effort: Pulmonary effort is normal.     Breath sounds: Normal breath sounds.  Abdominal:     General: Abdomen is flat. Bowel sounds are normal.     Palpations: Abdomen is soft.     Comments: G tube not examined today, abdominal binder   Musculoskeletal:        General: No swelling or tenderness. Normal range of motion.     Cervical back: Normal range of motion and neck supple. No rigidity.  Lymphadenopathy:     Cervical: Cervical adenopathy (palpable right-sided cervical lymphadenopathy but much smaller, overall consistent with a significant response) present.  Skin:    General: Skin is warm and dry.  Neurological:     General: No focal deficit present.     Mental Status: He is alert.  Psychiatric:        Mood and Affect: Mood normal.        Behavior: Behavior normal.   LABORATORY DATA:  I have reviewed the data as listed Lab Results  Component Value Date   WBC 4.2  01/08/2021   HGB 11.7 (L) 01/08/2021   HCT 35.5 (L) 01/08/2021   MCV 90.8 01/08/2021   PLT 155 01/08/2021     Chemistry      Component  Value Date/Time   NA 140 01/08/2021 0946   K 3.7 01/08/2021 0946   CL 103 01/08/2021 0946   CO2 29 01/08/2021 0946   BUN 20 01/08/2021 0946   CREATININE 1.21 01/08/2021 0946      Component Value Date/Time   CALCIUM 9.5 01/08/2021 0946   ALKPHOS 64 01/08/2021 0946   AST 14 (L) 01/08/2021 0946   ALT 9 01/08/2021 0946   BILITOT 0.5 01/08/2021 0946     Labs reviewed  RADIOGRAPHIC STUDIES: I have personally reviewed the radiological images as listed and agreed with the findings in the report. No results found.  All questions were answered. The patient knows to call the clinic with any problems, questions or concerns. I spent at least 30 minutes discussing the role of adjuvant chemotherapy in the treatment of nasopharyngeal carcinoma.  We have discussed about available options, Intergroup 0099 trial using adjuvant chemotherapy, Xeloda as adjuvant chemotherapy and failure free survival and overall survival statistics today.   Benay Pike, MD 01/08/2021 1:38 PM

## 2021-01-09 ENCOUNTER — Inpatient Hospital Stay: Payer: 59 | Admitting: Nutrition

## 2021-01-09 ENCOUNTER — Inpatient Hospital Stay: Payer: 59

## 2021-01-09 NOTE — Progress Notes (Signed)
Nutrition follow-up completed with patient status post treatment for nasopharyngeal cancer of the soft palate.   Patient had a G-tube placed on April 20. Current weight documented as 161.4 pounds July 7 stable from 160.4 pounds June 9. Patient reports he is able to eat a good variety of foods including all meat, vegetables, smoothies, and even bread if he chews it well and follows with water.  He reports he has been eating smaller amounts at 2 meals daily. Currently drinks 1 Ensure plus every morning and evening and uses 1 carton [employer] 1.4 every morning and evening via G-tube.  He denies difficulty with tube feeding.  He is drinking a lot of water. Patient states it is doubtful he will be having any more chemotherapy.  Estimated nutrition needs: 2400-2600 cal, 110-126 g protein, 2.6 L fluid.  Nutrition diagnosis: Food and nutrition related knowledge deficit improved.  Intervention: Educated patient to continue wide variety of foods throughout the day and try to increase amounts..  Suggested he continue drinking 2 cartons of Ensure Plus or equivalent daily by mouth. Recommended decreasing 1 carton of [employer] 1.4 by tube.  He could drink an additional carton of Ensure Plus to provide similar calories.  Once this is well-tolerated, patient urged to discontinue tube feeding altogether. Encouraged him to continue G-tube care and daily flushing.  Monitoring, evaluation, goals: Patient will tolerate adequate calories and protein to minimize weight loss.  Next visit: Wednesday August 3 with Vinnie Level.  **Disclaimer: This note was dictated with voice recognition software. Similar sounding words can inadvertently be transcribed and this note may contain transcription errors which may not have been corrected upon publication of note.**

## 2021-01-23 ENCOUNTER — Other Ambulatory Visit: Payer: Self-pay

## 2021-01-23 ENCOUNTER — Ambulatory Visit: Payer: 59 | Attending: Radiation Oncology

## 2021-01-23 VITALS — Wt 162.2 lb

## 2021-01-23 DIAGNOSIS — R1310 Dysphagia, unspecified: Secondary | ICD-10-CM | POA: Insufficient documentation

## 2021-01-23 NOTE — Therapy (Signed)
[facility] [address] [place], [place], [address] Phone: [phone]   Fax:  [phone]  Speech Language Pathology Treatment  Patient Details  Name: Alan LANESE Sr. 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    Currently in Pain? No/denies                   ADULT SLP TREATMENT - 01/23/21 1331       General Information   Behavior/Cognition Cooperative;Pleasant mood;Alert      Treatment Provided   Treatment provided Dysphagia      Dysphagia Treatment   Temperature Spikes Noted No    Respiratory Status Room air    Oral Cavity - Dentition Adequate natural dentition    Treatment Methods Skilled observation;Therapeutic exercise;Patient/caregiver education    Type of PO's observed Dysphagia 3 (soft);Thin liquids    Oral Phase Signs & Symptoms  Other (comment)   none noted today   Pharyngeal Phase Signs & Symptoms Other (comment)   none noted today   Other treatment/comments Pt cont to perform HEP once/day - SLP again provided rationale for BID with example of pt who has not performed HEP and now has G-tube for nutrition. Pt stated he would be better with BID completion - SLP told pt at least x3-4/week at BID is best for optimal outcomes persisting. Pt told SLP rationale for HEP 15 minutes later independentl.     Assessment / Recommendations / Plan   Plan Continue with current plan of care      Dysphagia Recommendations   Diet recommendations Dysphagia 3 (mechanical soft);Regular;Thin liquid    Liquids provided via Cup      Progression Toward Goals   Progression toward goals Progressing toward goals              SLP Education - 01/23/21 1350     Education Details rationale for BID HEP    Person(s) Educated Patient    Methods Explanation    Comprehension Verbalized understanding  SHORT TERM GOALS SLP 01-23-21  SLP SHORT TERM GOAL #1       Title pt will complete HEP with rare min A      Time        Period --   sessions, for all STGs      Status Achieved                SLP SHORT TERM GOAL #2      Title pt will tell SLP why pt is completing HEP with modified independence      Time       Status Achieved                SLP SHORT TERM GOAL #3      Title pt will describe 3 overt s/s aspiration PNA with modified independence      Time       Status Achieved                SLP SHORT TERM GOAL #4      Title pt will tell SLP how a food journal could hasten return to a more normalized diet      Time 2       Status Deferred due to pt eating dys I-regualr diet                                                   SLP Long Term Goals - 6/2/221603                       SLP LONG TERM GOAL #1      Title pt will complete HEP with modified independence over 2 visits 01-23-21      Time 2      Period --    or 7 total sessions, for all LTGs      Status Ongoing                SLP LONG TERM GOAL #2      Title pt will describe how to modify HEP over time, and the timeline associated with reduction in HEP frequency with modified independence over two sessions      Time 5      Status Ongoing                               Plan - 12/05/20 1557              Clinical Impression Statement At this time pt swallowing is deemed WNL/WFL with dys III and thin liquids. He is also eating some regular foods as well. [name] reports he has no overt s/s of aspiration PNA at this time, and none observed today. SLP reviewed the individualized HEP for dysphagia and pt completed each exercise on their own with modified independence. Data indicate that pt's swallow ability will likely decrease over the course of radiation therapy and could very well decline over time following conclusion of their radiation therapy due to muscle disuse atrophy and/or muscle fibrosis. Pt will cont to need to be seen by SLP in order to assess safety of PO intake, assess the need for recommending any objective swallow assessment, and ensuring pt correctly completes the individualized HEP.  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 ,MS, [place]  01/23/2021, 1:55 PM  [facility] [address] [place], [place], [address] Phone: [phone]   Fax:  [phone]   Name: Alan MOGEL Sr. MRN: [id number] Date of Birth: 05/26/1956

## 2021-02-02 ENCOUNTER — Encounter: Payer: Self-pay | Admitting: Hematology and Oncology

## 2021-02-02 NOTE — Progress Notes (Signed)
                                                                                                                                                             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) Staging form: Pharynx - Nasopharynx, AJCC 8th Edition - Clinical stage from 10/04/2020: Stage IVA (cT1, cN3, cM0) - Signed by Eppie Gibson, MD on 10/04/2020 Stage prefix: Initial diagnosis  Intent: Curative  Radiation Treatment Dates: 10/24/2020 through 12/12/2020 Site Technique Total Dose (Gy) Dose per Fx (Gy) Completed Fx Beam Energies  Neck: HN_NasoP IMRT 70/70 2 35/35 6X   Narrative: The patient tolerated radiation therapy relatively well with significant clinical response. He had some digestive issues that resolved with a change in his nutritional supplement.   Plan: The patient will follow-up with radiation oncology in 2-3 wks. -----------------------------------  Eppie Gibson, MD

## 2021-02-05 ENCOUNTER — Encounter: Payer: Self-pay | Admitting: Hematology and Oncology

## 2021-02-05 ENCOUNTER — Inpatient Hospital Stay: Payer: 59 | Admitting: Dietician

## 2021-02-05 ENCOUNTER — Inpatient Hospital Stay: Payer: 59 | Admitting: Hematology and Oncology

## 2021-02-05 ENCOUNTER — Inpatient Hospital Stay: Payer: 59 | Attending: Hematology and Oncology

## 2021-02-05 ENCOUNTER — Other Ambulatory Visit: Payer: Self-pay

## 2021-02-05 DIAGNOSIS — C113 Malignant neoplasm of anterior wall of nasopharynx: Secondary | ICD-10-CM | POA: Insufficient documentation

## 2021-02-05 DIAGNOSIS — R634 Abnormal weight loss: Secondary | ICD-10-CM

## 2021-02-05 DIAGNOSIS — C119 Malignant neoplasm of nasopharynx, unspecified: Secondary | ICD-10-CM

## 2021-02-05 DIAGNOSIS — C770 Secondary and unspecified malignant neoplasm of lymph nodes of head, face and neck: Secondary | ICD-10-CM | POA: Diagnosis not present

## 2021-02-05 DIAGNOSIS — Z79899 Other long term (current) drug therapy: Secondary | ICD-10-CM | POA: Diagnosis not present

## 2021-02-05 LAB — CBC WITH DIFFERENTIAL (CANCER CENTER ONLY)
Abs Immature Granulocytes: 0 10*3/uL (ref 0.00–0.07)
Basophils Absolute: 0 10*3/uL (ref 0.0–0.1)
Basophils Relative: 0 %
Eosinophils Absolute: 0 10*3/uL (ref 0.0–0.5)
Eosinophils Relative: 0 %
HCT: 35.4 % — ABNORMAL LOW (ref 39.0–52.0)
Hemoglobin: 11.5 g/dL — ABNORMAL LOW (ref 13.0–17.0)
Immature Granulocytes: 0 %
Lymphocytes Relative: 7 %
Lymphs Abs: 0.2 10*3/uL — ABNORMAL LOW (ref 0.7–4.0)
MCH: 30.5 pg (ref 26.0–34.0)
MCHC: 32.5 g/dL (ref 30.0–36.0)
MCV: 93.9 fL (ref 80.0–100.0)
Monocytes Absolute: 0.3 10*3/uL (ref 0.1–1.0)
Monocytes Relative: 9 %
Neutro Abs: 2.5 10*3/uL (ref 1.7–7.7)
Neutrophils Relative %: 84 %
Platelet Count: 193 10*3/uL (ref 150–400)
RBC: 3.77 MIL/uL — ABNORMAL LOW (ref 4.22–5.81)
RDW: 16 % — ABNORMAL HIGH (ref 11.5–15.5)
WBC Count: 3 10*3/uL — ABNORMAL LOW (ref 4.0–10.5)
nRBC: 0 % (ref 0.0–0.2)

## 2021-02-05 LAB — CMP (CANCER CENTER ONLY)
ALT: 10 U/L (ref 0–44)
AST: 12 U/L — ABNORMAL LOW (ref 15–41)
Albumin: 4 g/dL (ref 3.5–5.0)
Alkaline Phosphatase: 69 U/L (ref 38–126)
Anion gap: 9 (ref 5–15)
BUN: 18 mg/dL (ref 8–23)
CO2: 27 mmol/L (ref 22–32)
Calcium: 9.7 mg/dL (ref 8.9–10.3)
Chloride: 105 mmol/L (ref 98–111)
Creatinine: 1.26 mg/dL — ABNORMAL HIGH (ref 0.61–1.24)
GFR, Estimated: >60 mL/min (ref >60)
Glucose, Bld: 119 mg/dL — ABNORMAL HIGH (ref 70–99)
Potassium: 3.8 mmol/L (ref 3.5–5.1)
Sodium: 141 mmol/L (ref 135–145)
Total Bilirubin: 0.4 mg/dL (ref 0.3–1.2)
Total Protein: 7.3 g/dL (ref 6.5–8.1)

## 2021-02-05 LAB — MAGNESIUM: Magnesium: 2.1 mg/dL (ref 1.7–2.4)

## 2021-02-05 NOTE — Progress Notes (Signed)
Nutrition Follow-up:  Patient is status post concurrent chemoradiation with weekly cisplatin for nasopharyngeal cancer of soft palate. Patient had G-tube placed on April 20.   Met with patient in clinic. He is in good spirits, reports eating a variety of foods, says his taste has greatly improved. Patient eats 5-6 small meals/day, he is enjoying smoothies, all meats, vegetables which he is swallowing without difficulty. Reports eating bread followed by a few sips of water. Patient continues to drink 2-3 Ensure Plus and gives 1 carton of [facility] via tube most days. He is flushing tube daily. Patient asking for tape today. Patient reports mouth is dry, he is constantly drinking water. Yesterday he had an Ensure Plus, bottle of water for breakfast, pepperoni pizza for lunch, cucumber, tomato, fruit in the afternoon, smothered chicken, corn, mashed potatoes for dinner, Ensure before bed. Patient reports he has returned to work a few days a week. He is completing swallowing exercises daily. Patient reports he will not be completing adjuvant chemotherapy, plans to have repeat PET scan and will follow up with Dr. Chryl Heck in October. Patient reports if weights are stable ~170's, MD will remove feeding tube at that time pending results of scan.   Medications: reviewed  Labs: reviewed  Anthropometrics: Weight 162.4 lb today increased from 162 lb 3.2 oz on 7/21 and 160.4 lb on 6/09  Estimated Energy Needs  Kcals: 2400-2600 Protein: 110-126 Fluid: 2.6 L  NUTRITION DIAGNOSIS: Food and nutrition related knowledge deficit improved   INTERVENTION:  Continue eating a variety of foods throughout the day, encouraged high calorie, high protein snacks in between meals Recommended continuing to drink 2 Ensure Plus daily for added calories and protein, coupons provided Discussed giving [employer] 1.4 via tube as needed with decreased oral intake, pt will continue daily water flush Patient given roll of medical  tape today Continue daily swallowing exercises  Patient has contact information    MONITORING, EVALUATION, GOAL: weight trends, tube feeding, oral intake   NEXT VISIT: Tuesday, September 13 in clinic

## 2021-02-05 NOTE — Assessment & Plan Note (Signed)
Weight loss has stabilized in the past several weeks.  He is currently weighing 162 pounds, baseline weight was 186 pounds.  We will keep the G-tube in for another month or so and once he continues to improve with his weight, target weight is 170-175 pounds, we can consider removal.

## 2021-02-05 NOTE — Assessment & Plan Note (Signed)
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 We have discussed about the Intergroup 0099 trial which used concurrent chemoradiation with adjuvant chemo versus radiation alone.  We have discussed that the survival benefit was greater in the concurrent chemoradiotherapy arm and adjuvant chemotherapy.  We also discussed about some trials using adjuvant Xeloda for about 8 cycles.  This study has shown failure free survival benefit but overall survival data is pending.  We have discussed on multiple occasions about adverse effects of adjuvant chemotherapy with cisplatin and 5-FU versus adjuvant Xeloda. He is still very reluctant to proceed with chemotherapy at this time.  He would like to see what the PET/CT in September shows.   He understands that if he has residual disease in September, we may have to consider surgery and chemotherapy alone may not be a reasonable option.

## 2021-02-05 NOTE — Progress Notes (Signed)
[facility] FOLLOW UP NOTE  Patient Care Team: Alan Ebbs, MD as PCP - General (Internal Medicine) Alan Ebbs, MD (Internal Medicine)  CHIEF COMPLAINTS/PURPOSE OF CONSULTATION:  Follow up after completion of chemoradiation.  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 We have discussed about the Intergroup 0099 trial which used concurrent chemoradiation with adjuvant chemo versus radiation alone.  We have discussed that the survival benefit was greater in the concurrent chemoradiotherapy arm and adjuvant chemotherapy.  We also discussed about some trials using adjuvant Xeloda for about 8 cycles.  This study has shown failure free survival benefit but overall survival data is pending.  We have discussed on multiple occasions about adverse effects of adjuvant chemotherapy with cisplatin and 5-FU versus adjuvant Xeloda. He is still very reluctant to proceed with chemotherapy at this time.  He would like to see what the PET/CT in September shows.   He understands that if he has residual disease in September, we may have to consider surgery and chemotherapy alone may not be a reasonable option.  Weight loss, unintentional Weight loss has stabilized in the past several weeks.  He is currently weighing 162 pounds, baseline weight was 186 pounds.  We will keep the G-tube in for another month or so and once he continues to improve with his weight, target weight is 170-175 pounds, we can consider removal.    HISTORY OF PRESENTING ILLNESS:   Alan Leech Sr. 65 y.o. male is here because of SCC of nasopharyngeal origin, EBV neg.  Oncology History Overview Note  Alan Yara. is a 65 y.o.  male who presented with six-month history of gradually enlarging bilateral lymph nodes with associated mild discomfort and pressure.   Subsequently, the patient saw Alan Henry, who recommended CT scan of neck and ultrasound-guided needle core biopsy for soft tissue diagnosis.   Biopsy of right neck lymph node on 08/30/2020 revealed: squamous cell carcinoma, p16 positive. EBV ordered, negative.  He had CT soft tissue neck done on August 07, 2020 which showed nasopharyngeal soft tissue prominence, greatest soft tissue effacement of adjacent right parapharyngeal fat suspected to be at least enlarged right retropharyngeal lymph node.  Bulky bilateral cervical midline likely right intraparotid lymphadenopathy  PET/CT scan shows intense FDG uptake with area of increased soft tissue fullness in the posterior nasopharynx.  Extensive bulky bilateral FDG avid cervical adenopathy.  Large FDG avid lymph node also identified within the right parotid gland.  Imaging findings compatible with metastatic adenopathy.  Subcentimeter right supraclavicular lymph node exhibits mild FDG uptake of low background activity equivocal for nodal metastasis.  No additional signs of metastatic disease.  He had  right neck lymph node biopsy which is positive for squamous cell carcinoma, p16 positive, EBV negative  During his initial visit, we recommended concurrent CRT followed by adjuvant chemotherapy. We discussed about every 21 day cisplatin, but he was very reluctant to proceed with every 21 days cisplatin, hence now on weekly cisplatin.    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  He is here for follow-up by himself.  He has been doing quite well, eating well.  He has been able to maintain his weight, 162 pounds in the past 3 weeks.  He is still reluctant to proceed with adjuvant chemotherapy.  He would like to follow-up with the PET/CT in September and decide if he wants to do more systemic chemo.  His wife has left back to [place] and he is worried about the side effects understandably.  He still using the G-tube for feeding, uses 1 [employer] supplement.  No hearing loss, peripheral neuropathy or other chemotherapy adverse effects reported today. Rest of the pertinent 10 point ROS reviewed and negative.  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[place])    Pneumonia    Sleep apnea     SURGICAL HISTORY: Past Surgical History:  Procedure Laterality Date   FINE NEEDLE ASPIRATION BIOPSY     HERNIA REPAIR     Umbilicatl hernia   IR IMAGING GUIDED PORT INSERTION  10/18/2020   LAPAROSCOPIC INSERTION GASTROSTOMY TUBE N/A 10/23/2020   Procedure: LAPAROSCOPIC ASSISTED PEG TUBE;  Surgeon: Alan Bolt, MD;  Location: [facility];  Service: General;  Laterality: N/A;  43   ROTATOR CUFF REPAIR     Torn Labrum      SOCIAL HISTORY: Social History   Socioeconomic History   Marital status: Married    Spouse name: Not on file   Number of children: Not on file   Years of education: Not on file   Highest education level: Not on file  Occupational History   Not on file  Tobacco Use   Smoking status: Never   Smokeless tobacco: Never  Vaping Use   Vaping Use: Never used  Substance and Sexual  Activity   Alcohol use: Not Currently    Comment: very rarely   Drug use: No   Sexual activity: Not Currently  Other Topics Concern   Not on file  Social History Narrative   Not on file   Social Determinants of Health   Financial Resource Strain: Not on file  Food Insecurity: No Food Insecurity   Worried About Running Out of Food in the Last Year: Never true   [place] in the Last Year: Never true  Transportation Needs: No Transportation Needs   Lack of Transportation (Medical): No   Lack of Transportation (Non-Medical): No  Physical Activity: Not on file  Stress: No Stress Concern Present   Feeling of Stress : Not at all  Social Connections: Socially Integrated   Frequency of Communication with Friends and Family: More than three times a week   Frequency of Social Gatherings with Friends and Family: Twice a week   Attends Religious Services: More than 4 times per year   Active Member of [employer] or Organizations: Yes   Attends Archivist Meetings: 1 to 4  times per year   Marital Status: Married  Human resources officer Violence: Not on file    FAMILY HISTORY: No family history on file.  ALLERGIES:  has No Known Allergies.  MEDICATIONS:  Current Outpatient Medications  Medication Sig Dispense Refill   amLODipine (NORVASC) 10 MG tablet Take 10 mg by mouth daily.     atorvastatin (LIPITOR) 80 MG tablet Take 80 mg by mouth daily.     ergocalciferol (VITAMIN D2) 1.25 MG (50000 UT) capsule Take 50,000 Units by mouth once a week. Wednesday     Nutritional Supplements ([employer] STANDARD 1.4) LIQD 1,950 mLs by Enteral route daily.     omeprazole (PRILOSEC) 20 MG capsule Take 20 mg by mouth daily.     cetirizine (ZYRTEC) 10 MG tablet Take 10 mg by mouth daily. (Patient not taking: Reported on 02/05/2021)     docusate sodium (COLACE) 100 MG capsule Take 1 capsule (100 mg total) by mouth 2 (two) times daily. (Patient not taking: Reported on 02/05/2021) 60 capsule 0    lidocaine (XYLOCAINE) 2 % solution Patient: Mix 1part 2% viscous lidocaine, 1part H20. Swish & swallow 89m of diluted mixture, 391m before meals and at bedtime, up to QID (Patient not taking: Reported on 02/05/2021) 200 mL 3   lidocaine-prilocaine (EMLA) cream Apply to affected area once (Patient not taking: Reported on 02/05/2021) 30 g 3   nitroGLYCERIN (NITROSTAT) 0.4 MG SL tablet Place 0.4 mg under the tongue every 5 (five) minutes x 3 doses as needed for chest pain.     sildenafil (VIAGRA) 50 MG tablet Take 50-100 mg by mouth daily as needed for erectile dysfunction.     No current facility-administered medications for this visit.    PHYSICAL EXAMINATION:  ECOG PERFORMANCE STATUS: 1 - Symptomatic but completely ambulatory  Vitals:   02/05/21 0938  BP: (!) 162/92  Pulse: 97  Resp: 18  Temp: (!) 97.2 F (36.2 C)  SpO2: 100%   Filed Weights   02/05/21 0938  Weight: 162 lb 6.4 oz (73.7 kg)    Physical Exam Constitutional:      Appearance: Normal appearance. He is not ill-appearing.     Comments: Appears to have lost weight   HENT:     Head: Normocephalic and atraumatic.     Mouth/Throat:     Mouth: Mucous membranes are moist.     Pharynx: No oropharyngeal exudate or posterior oropharyngeal erythema (mucositis resolved).  Cardiovascular:     Rate and Rhythm: Normal rate and regular rhythm.  Pulmonary:     Effort: Pulmonary effort is normal.     Breath sounds: Normal breath sounds.  Abdominal:     General: Abdomen is flat. Bowel sounds are normal.     Palpations: Abdomen is soft.     Comments: G tube dressed. NO tenderness around the G tube. Dressing is dry without any purulence.   Musculoskeletal:        General: No swelling or tenderness. Normal range of motion.     Cervical back: Normal range of motion and neck supple. No rigidity.  Lymphadenopathy:     Cervical: Cervical adenopathy (palpable right-sided cervical lymphadenopathy but much smaller, overall consistent  with a significant response, measuring about 2 to 2-1/2 cm in size today) present.  Skin:    General: Skin is warm and dry.  Neurological:     General: No focal deficit present.     Mental Status: He is alert.  Psychiatric:  Mood and Affect: Mood normal.        Behavior: Behavior normal.   LABORATORY DATA:  I have reviewed the data as listed Lab Results  Component Value Date   WBC 3.0 (L) 02/05/2021   HGB 11.5 (L) 02/05/2021   HCT 35.4 (L) 02/05/2021   MCV 93.9 02/05/2021   PLT 193 02/05/2021     Chemistry      Component Value Date/Time   NA 141 02/05/2021 0920   K 3.8 02/05/2021 0920   CL 105 02/05/2021 0920   CO2 27 02/05/2021 0920   BUN 18 02/05/2021 0920   CREATININE 1.26 (H) 02/05/2021 0920      Component Value Date/Time   CALCIUM 9.7 02/05/2021 0920   ALKPHOS 69 02/05/2021 0920   AST 12 (L) 02/05/2021 0920   ALT 10 02/05/2021 0920   BILITOT 0.4 02/05/2021 0920     Labs reviewed, ongoing leukopenia, no absolute neutropenia.  Anemia is stable.  RADIOGRAPHIC STUDIES: I have personally reviewed the radiological images as listed and agreed with the findings in the report. No results found.  All questions were answered. The patient knows to call the clinic with any problems, questions or concerns. I spent at least 30 minutes discussing the role of adjuvant chemotherapy in the management of nasopharyngeal carcinoma.  We have discussed about this on multiple occasions including trying Xeloda as an alternate.  He is still reluctant to proceed with it right now.  He would like to see what the PET/CT shows and then decide about trying any additional treatment.   Benay Pike, MD 02/05/2021 11:05 AM

## 2021-02-06 ENCOUNTER — Encounter: Payer: 59 | Admitting: Nutrition

## 2021-02-06 ENCOUNTER — Ambulatory Visit: Payer: 59

## 2021-02-19 ENCOUNTER — Ambulatory Visit: Payer: 59

## 2021-02-21 ENCOUNTER — Ambulatory Visit: Payer: 59 | Attending: Radiation Oncology

## 2021-02-21 ENCOUNTER — Other Ambulatory Visit: Payer: Self-pay

## 2021-02-21 DIAGNOSIS — R1310 Dysphagia, unspecified: Secondary | ICD-10-CM | POA: Diagnosis present

## 2021-02-21 NOTE — Addendum Note (Signed)
Addended by: Garald Balding B on: 02/21/2021 02:00 PM   Modules accepted: Orders

## 2021-02-21 NOTE — Therapy (Signed)
[facility] [address] [place], [place], [address] Phone: [phone]   Fax:  [phone]  Speech Language Pathology Treatment/Renewal Summary  Patient Details  Name: Alan CORIELL Sr. 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   Subjective Assessment - 02/21/21 1323     Subjective Lsat week had the nausea from radiation and chemo back - this week is better.    Currently in Pain? No/denies                   ADULT SLP TREATMENT - 02/21/21 1324       General Information   Behavior/Cognition Cooperative;Pleasant mood;Alert      Treatment Provided   Treatment provided Dysphagia      Dysphagia Treatment   Temperature Spikes Noted No    Respiratory Status Room air    Oral Cavity - Dentition Adequate natural dentition    Treatment Methods Skilled observation;Therapeutic exercise;Patient/caregiver education    Patient observed directly with PO's Yes     Type of PO's observed Thin liquids;Regular    Oral Phase Signs & Symptoms Other (comment)   none noted   Pharyngeal Phase Signs & Symptoms --   pt req'd water with crackers   Other treatment/comments Pt completed HEP independently. Pt stated completing HEP BID. SLP congratulated pt on incr'd frequency and reiterated to pt he will do the HEP until 06-13-21, then x2-3/week. Pt is agreeable to every other month ST due to progress.      Assessment / Recommendations / Plan   Plan --   next appointment in 2 months     Dysphagia Recommendations   Diet recommendations Regular;Dysphagia 3 (mechanical soft);Thin liquid    Liquids provided via [place] SLP 01-23-21        SLP SHORT TERM GOAL #1        Title pt will complete HEP with rare min A      Time        Period --   sessions, for all STGs      Status Achieved  SLP SHORT TERM GOAL #2      Title pt will tell SLP why pt is completing HEP with modified independence      Time        Status Achieved                SLP SHORT TERM GOAL #3      Title pt will describe 3 overt s/s aspiration PNA with modified independence      Time        Status Achieved                SLP SHORT TERM GOAL #4      Title pt will tell SLP how a food journal could hasten return to a more normalized diet      Time 2       Status Deferred due to pt eating dys I-regualr diet                                                        SLP Long Term Goals - 6/2/221603                       SLP LONG TERM GOAL #1      Title pt will complete HEP with modified independence over 2 visits 01-23-21      Time       Period --   or 7 total sessions, for all LTGs      Status Achieved               SLP LONG TERM GOAL #2      Title pt will describe how to modify HEP over time, and the timeline associated with reduction in HEP frequency with modified independence over two sessions      Time 4      Status Ongoing                                 Plan - 12/05/20 1557              Clinical Impression Statement At this time pt swallowing is deemed WNL/WFL with dys III and thin liquids and is also eating some regular foods as well. [name] reports he has no overt s/s of aspiration PNA at this time, and none observed today. He is completing HEP BID at this time. SLP reviewed the individualized HEP for dysphagia and pt completed each exercise on their own with modified independence. Data indicate that pt's swallow ability will likely decrease over the course of radiation therapy and could very well decline over time following conclusion of their radiation therapy due to muscle disuse atrophy and/or muscle fibrosis. Pt will cont to need to be seen by SLP in order to assess safety of PO intake, assess the need for recommending any objective swallow assessment, and ensuring pt correctly completes the individualized HEP.             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 [place], [place],  [address] Phone: [phone]   Fax:  [phone]   Name: Alan MORDEN Sr. MRN: [id number] Date of Birth: September 05, 1955

## 2021-02-28 ENCOUNTER — Telehealth: Payer: Self-pay | Admitting: *Deleted

## 2021-02-28 NOTE — Telephone Encounter (Signed)
CALLED PATIENT TO INFORM OF PET SCAN FOR 03-18-21- ARRIVAL TIME- 10:30 AM @ [facility], PATIENT TO HAVE WATER ONLY - 6 HRS. PRIOR TO TEST, PATIENT TO RECEIVE PET RESULTS FROM DR. SQUIRE ON 03-21-21 @ 11:40 AM, SPOKE WITH PATIENT AND HE VERIFIED UNDERSTANDING ALL THESE APPTS.

## 2021-03-18 ENCOUNTER — Other Ambulatory Visit: Payer: Self-pay

## 2021-03-18 ENCOUNTER — Encounter (HOSPITAL_COMMUNITY)
Admission: RE | Admit: 2021-03-18 | Discharge: 2021-03-18 | Disposition: A | Discharge status: Home / Self Care | Payer: 59 | Source: Ambulatory Visit | Attending: Radiation Oncology | Admitting: Radiation Oncology

## 2021-03-18 ENCOUNTER — Ambulatory Visit: Payer: 59 | Admitting: Nutrition

## 2021-03-18 ENCOUNTER — Ambulatory Visit: Payer: 59 | Admitting: Radiation Oncology

## 2021-03-18 DIAGNOSIS — C119 Malignant neoplasm of nasopharynx, unspecified: Secondary | ICD-10-CM | POA: Diagnosis present

## 2021-03-18 LAB — GLUCOSE, CAPILLARY: Glucose-Capillary: 119 mg/dL — ABNORMAL HIGH (ref 70–99)

## 2021-03-18 MED ORDER — FLUDEOXYGLUCOSE F - 18 (FDG) INJECTION
8.4000 | Freq: Once | INTRAVENOUS | Status: AC
  Administered 2021-03-18: 7.79 via INTRAVENOUS

## 2021-03-18 NOTE — Progress Notes (Signed)
Nutrition follow-up completed with patient who has completed concurrent chemoradiation therapy for nasopharyngeal cancer of the soft palate.  Patient is status post G-tube placement on April 20.  Weight documented as 157.6 pounds September 13 which is decreased from 162.4 pounds August 3.  Patient states he has been drinking 3 cartons of boost very high-calorie by mouth every day in addition to small amounts of food.  He occasionally would also include 1 [employer] shake. Reports food is tasting better.   He denies nausea, vomiting, constipation, and diarrhea. He has not used his feeding tube for nutrition support but is flushing it every day without difficulty.  Estimated energy needs: 2400-2600 cal, 110-126 g protein, 2.6 L fluid.  Nutrition diagnosis: Food and nutrition related knowledge deficit improved.  Intervention: Increase boost very high-calorie to 4 cartons daily. Continue 1 [employer] daily. Continue small amounts of solid foods as desired. Continue flushing feeding tube with water every day.  Monitoring, evaluation, goals: Patient will tolerate adequate calorie and protein intake to promote weight gain.  Next visit: To be scheduled as needed.  **Disclaimer: This note was dictated with voice recognition software. Similar sounding words can inadvertently be transcribed and this note may contain transcription errors which may not have been corrected upon publication of note.**

## 2021-03-20 NOTE — Progress Notes (Signed)
Alan Henry presents today for follow-up after completing radiation to his nasopharynx on 12/12/2020 and to review PET scan results from 03/18/2021  Pain issues, if any: Report tenderness to his neck with palpation  Using a feeding tube?: No--currently only flushing with water daily (taking all nutrition via mouth).  Weight changes, if any:  Wt Readings from Last 3 Encounters:  03/21/21 157 lb 12.8 oz (71.6 kg)  03/21/21 156 lb 8 oz (71 kg)  02/05/21 162 lb 6.4 oz (73.7 kg)   Swallowing issues, if any: Patient denies as long as he has liquids. Reports eating 5-6 small meals throughout the day as well as drinking 3-4 supplements a day Smoking or chewing tobacco? None Using fluoride trays daily? N/A--met with Dr. [place] this morning Last ENT visit was on: 01/20/2021 Saw Dr. [name] for: "Removal Foreign Body right Ear Canal. He will follow-up in 6 to 8 weeks for recheck if he continues to have any hearing changes. Continue follow-up and work-up through the medical and radiation oncology group. Follow-up with me as needed." Other notable issues, if any: Last saw medical oncologist Dr. Chryl Heck on 02/05/2021. Reports a short bout of nausea that he experienced about a month after completing all his treatment, but denies any other symptoms (vomiting, decrease in appetite, diarrhea, etc), and reports issue resolved on its own after about 3 days. Denies any issues with neck range of motion or jaw (states he's diligent about doing his PT and trismus exercises). 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   Vitals:   03/21/21 1143  BP: (!) 157/92  Pulse: 87  Resp: 16  Temp: 97.7 F (36.5 C)  SpO2: 99%

## 2021-03-21 ENCOUNTER — Other Ambulatory Visit: Payer: Self-pay

## 2021-03-21 ENCOUNTER — Ambulatory Visit
Admission: RE | Admit: 2021-03-21 | Discharge: 2021-03-21 | Disposition: A | Discharge status: Home / Self Care | Payer: 59 | Source: Ambulatory Visit | Attending: Radiation Oncology | Admitting: Radiation Oncology

## 2021-03-21 ENCOUNTER — Ambulatory Visit (INDEPENDENT_AMBULATORY_CARE_PROVIDER_SITE_OTHER): Payer: Dental | Admitting: Dentistry

## 2021-03-21 ENCOUNTER — Ambulatory Visit: Payer: Self-pay | Admitting: Radiation Oncology

## 2021-03-21 ENCOUNTER — Encounter: Payer: Self-pay | Admitting: Radiation Oncology

## 2021-03-21 VITALS — BP 157/92 | HR 87 | Temp 97.7°F | Resp 16 | Ht 69.0 in | Wt 157.8 lb

## 2021-03-21 DIAGNOSIS — Z923 Personal history of irradiation: Secondary | ICD-10-CM | POA: Insufficient documentation

## 2021-03-21 DIAGNOSIS — R634 Abnormal weight loss: Secondary | ICD-10-CM

## 2021-03-21 DIAGNOSIS — R110 Nausea: Secondary | ICD-10-CM | POA: Insufficient documentation

## 2021-03-21 DIAGNOSIS — K117 Disturbances of salivary secretion: Secondary | ICD-10-CM | POA: Diagnosis not present

## 2021-03-21 DIAGNOSIS — Z79899 Other long term (current) drug therapy: Secondary | ICD-10-CM | POA: Diagnosis not present

## 2021-03-21 DIAGNOSIS — C113 Malignant neoplasm of anterior wall of nasopharynx: Secondary | ICD-10-CM

## 2021-03-21 DIAGNOSIS — C119 Malignant neoplasm of nasopharynx, unspecified: Secondary | ICD-10-CM

## 2021-03-21 DIAGNOSIS — R432 Parageusia: Secondary | ICD-10-CM | POA: Diagnosis not present

## 2021-03-21 DIAGNOSIS — Z5189 Encounter for other specified aftercare: Secondary | ICD-10-CM

## 2021-03-21 DIAGNOSIS — R1310 Dysphagia, unspecified: Secondary | ICD-10-CM

## 2021-03-21 DIAGNOSIS — Y842 Radiological procedure and radiotherapy as the cause of abnormal reaction of the patient, or of later complication, without mention of misadventure at the time of the procedure: Secondary | ICD-10-CM

## 2021-03-21 DIAGNOSIS — R5383 Other fatigue: Secondary | ICD-10-CM | POA: Diagnosis not present

## 2021-03-21 DIAGNOSIS — C118 Malignant neoplasm of overlapping sites of nasopharynx: Secondary | ICD-10-CM | POA: Diagnosis not present

## 2021-03-21 MED ORDER — SODIUM FLUORIDE 1.1 % DT GEL: 1.0000 "application " | Freq: Every day | DENTAL | 11 refills | Status: AC

## 2021-03-21 NOTE — Progress Notes (Signed)
Oncology Nurse Navigator Documentation   I met with Mr. Hornbaker during his follow up appointment with Dr. Isidore Moos today to receive results of his recent PET scan. He reports doing well with his weight stable after radiation/chemotherapy treatments and not using his PEG in 2 weeks. I will call him in 1-2 weeks to check on his weight and possibly request to have his PEG removed. He will have a PET scan in mid January. I've also scheduled him with Dr. Redmond Baseman on 9/20 for evaluation of an area to the back right of his scalp for possible biopsy. Mr. Gasparro is aware of that appointment. He knows to call me if he has any concerns or questions.   Harlow Asa RN, BSN, OCN Head & Neck Oncology Nurse [place] [facility] Phone # [phone]  Fax # [phone]

## 2021-03-21 NOTE — Patient Instructions (Addendum)
[facility] Benson [place], D.M.D. Phone: [phone] Fax: [phone]   It was a pleasure seeing you today!  Please refer to the information below regarding your dental visit with [place].  Call us if any questions or concerns come up after you leave.   Thank you for giving [place] the opportunity to provide care for you.  If there is anything we can do for you, please let us know.    RECOMMENDATIONS: Brush after meals and at bedtime.  Floss once a day, and use fluoride at bedtime. Use trismus exercises as directed below. Use CLOSYs for dry mouth, and salt water/baking soda rinses to help with any mouth sores. Take multiple sips of water as needed.  Stay hydrated. Return to your regular dentist for routine dental care including cleanings and periodic exams.  If you do not have a regular dentist, it is important to establish care at an outside dental office of your choice.  Call us if any problems or concerns arise.    TRISMUS: Trismus is a condition where the jaw does not allow the mouth to open as wide as it usually does.  This can happen almost suddenly, or in other cases the process is so slow, it is hard to notice it-until it is too far along.  When the jaw joints and/or muscles have been exposed to radiation treatments, the onset of trismus is very slow.  This is because the muscles are losing their stretching ability over a long period of time, as long as 2 YEARS after the end of radiation.  It is therefore important to exercise these muscles and joints.  Trismus exercises: Use the stack of tongue depressors measuring the same or a little less than your maximum opening that was recorded at your first dental visit. Place the stack in your mouth, supporting the other end with your hand. Allow 30 seconds for muscle stretching. Rest for a few seconds. Repeat 3-5 times. For all radiation patients, this exercise is recommended in the mornings and evenings  unless otherwise instructed. The exercises should be done for a period of 2 YEARS after the end of radiation. Maximum jaw opening should be checked routinely on recall dental visits by your general dentist. Report any changes, soreness, or difficulties encountered when doing the exercises to your dentist.     [place]   How do I use my fluoride trays? The best time to use your Fluoride is in the morning after breakfast, or right before bed time.  You must brush your teeth very well and floss before using the Fluoride in order to get the best use out of the Fluoride treatments. Place 1 drop of Fluoride in each tooth space in the tray.  You do not need to use more. Place the tray(s) over your teeth.  Make sure the trays are seated all the way.  Remember, they only fit one way on your teeth. Clench your teeth together a few times with light chewing motions to distribute the fluoride. Leave the trays in place for a full 5 minutes.  You may drool a bit, so keep a tissue handy. At the end of the 5 minutes, take the trays out.  SPIT OUT the remaining fluoride, but DO NOT RINSE. DO NOT eat or drink after treatments for at least 30 minutes.  This is why the best time for your treatments is before bedtime.  How do I care for my fluoride trays? Clean the inside  of your Fluoride trays using cold water and a toothbrush. In order to keep your trays from discoloring and free from odors, soak them overnight in denture cleaners or soap and water.  Do not use bleach or non-denture products. Store the trays in a safe dry place AWAY from any heat until your next treatment.  Finally, be sure to let your pharmacy know when you are close to needing a new refill for your fluoride prescription so they have it ready for you without interruption of fluoride use.   QUESTIONS? If anything happens to your Fluoride trays, or they don't fit as well after any dental work, please let us know as soon as  possible.  Call the [facility] to schedule an appointment at [phone].

## 2021-03-21 NOTE — Progress Notes (Signed)
Radiation Oncology         [phone]) [phone] ________________________________  Name: Alan Leech Sr. MRN: [id number]  Date: 03/21/2021  DOB: September 10, 1955  Follow-Up Visit Note  CC: Nolene Ebbs, MD  [name], MD  Diagnosis and Prior Radiotherapy:    C11.3   ICD-10-CM   1. 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 Bisig presents today for follow-up after completing radiation to his nasopharynx on 12/12/2020 and to review PET scan results from 03/18/2021  Pain issues, if any: Report tenderness to his neck with palpation  Using a feeding tube?: No--currently only flushing with water daily (taking all nutrition via mouth).  Weight changes, if any:  Wt Readings from Last 3 Encounters:  03/21/21 157 lb 12.8 oz (71.6 kg)  03/21/21 156 lb 8 oz (71 kg)  02/05/21 162 lb 6.4 oz (73.7 kg)   Swallowing issues, if any: Patient denies as long as he has liquids. Reports eating 5-6 small meals throughout the day as well as drinking 3-4 supplements a day Smoking or chewing tobacco? None Using fluoride trays daily? N/A--met with Dr. [place] this morning Last ENT visit was on: 01/20/2021 Saw Dr. [name] for: "Removal Foreign Body right Ear Canal. He will follow-up in 6 to 8 weeks for recheck if he continues to have any hearing changes. Continue follow-up and work-up through the medical and radiation oncology group. Follow-up with me as needed." Other notable issues, if any: Last saw medical oncologist Dr. Chryl Heck on 02/05/2021. Reports a short bout of nausea that he experienced about a month after completing all his treatment, but denies any  other symptoms (vomiting, decrease in appetite, diarrhea, etc), and reports issue resolved on its own after about 3 days. Denies any issues with neck range of motion or jaw (states he's diligent about doing his PT and trismus exercises). 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   Has noticed a somewhat itchy pustular lesion over his right posterior neck/occipital region  Vitals:   03/21/21 1143  BP: (!) 157/92  Pulse: 87  Resp: 16  Temp: 97.7 F (36.5 C)  SpO2: 99%                      ALLERGIES:  has No Known Allergies.  Meds: Current Outpatient Medications  Medication Sig Dispense Refill   amLODipine (NORVASC) 10 MG tablet Take 10 mg by mouth daily.     atorvastatin (LIPITOR) 80 MG tablet Take 80 mg by mouth daily.     cetirizine (ZYRTEC) 10 MG tablet Take 10 mg by mouth daily. (Patient not taking: Reported on 02/05/2021)     docusate sodium (COLACE) 100 MG capsule Take 1 capsule (100 mg total) by mouth 2 (two) times daily. (Patient not taking: Reported on 02/05/2021) 60 capsule 0   ergocalciferol (VITAMIN D2) 1.25 MG (50000 UT) capsule Take 50,000 Units by mouth once a week. Wednesday     lidocaine (XYLOCAINE) 2 % solution Patient: Mix 1part 2% viscous lidocaine, 1part H20. Swish & swallow 30m of diluted mixture, 366m before meals and at bedtime, up to QID (  Patient not taking: Reported on 02/05/2021) 200 mL 3   lidocaine-prilocaine (EMLA) cream Apply to affected area once (Patient not taking: Reported on 02/05/2021) 30 g 3   nitroGLYCERIN (NITROSTAT) 0.4 MG SL tablet Place 0.4 mg under the tongue every 5 (five) minutes x 3 doses as needed for chest pain.     Nutritional Supplements (KATE FARMS STANDARD 1.4) LIQD 1,950 mLs by Enteral route daily.     omeprazole (PRILOSEC) 20 MG capsule Take 20 mg by mouth daily.     sildenafil (VIAGRA) 50 MG tablet Take 50-100 mg by mouth daily as needed for erectile  dysfunction.     sodium fluoride (SODIUM FLUORIDE 5000 PPM) 1.1 % GEL dental gel Take 1 application by mouth at bedtime. Place 1 drop into each tooth space and leave in mouth for 5 minutes at bedtime.  Do not rinse with water, eat or drink for at least 30 minutes after use. 120 mL 11   No current facility-administered medications for this encounter.    Physical Findings: The patient is in no acute distress. Patient is alert and oriented. Wt Readings from Last 3 Encounters:  03/21/21 157 lb 12.8 oz (71.6 kg)  03/21/21 156 lb 8 oz (71 kg)  02/05/21 162 lb 6.4 oz (73.7 kg)    height is _0  (1.753 m) and weight is 157 lb 12.8 oz (71.6 kg). His temporal temperature is 97.7 F (36.5 C). His blood pressure is 157/92 (abnormal) and his pulse is 87. His respiration is 16 and oxygen saturation is 99%. .  General: Alert and oriented, in no acute distress HEENT: Head is normocephalic. Extraocular movements are intact.  No lesions in the mouth or upper throat  Neck: No palpable lymphadenopathy in the cervical or supraclavicular neck.  There is very subtle firmness, 1 cm in dimension, over the inferior right parotid gland, consistent with scar tissue where he previously had an enlarged node  Skin: In the right occipital neck there is a pustular subcutaneous lesion with a dark central pore -see photo below  HEART RRR CHEST CTAB MSK: Normal muscle tone, ambulatory Psychiatric: Judgment and insight are intact. Affect is appropriate.     Lab Findings: Lab Results  Component Value Date   WBC 3.0 (L) 02/05/2021   HGB 11.5 (L) 02/05/2021   HCT 35.4 (L) 02/05/2021   MCV 93.9 02/05/2021   PLT 193 02/05/2021    Lab Results  Component Value Date   TSH 0.818 10/08/2020    Radiographic Findings: NM PET Image Restag (PS) Skull Base To Thigh  Result Date: 03/19/2021 CLINICAL DATA:  Subsequent treatment strategy for nasopharyngeal in a 65 year old male. EXAM: NUCLEAR MEDICINE PET SKULL BASE TO THIGH  TECHNIQUE: 7.79 mCi F-18 FDG was injected intravenously. Full-ring PET imaging was performed from the skull base to thigh after the radiotracer. CT data was obtained and used for attenuation correction and anatomic localization. Fasting blood glucose: 119 mg/dl COMPARISON:  Prior PET imaging from March of 2022. CT of the neck from August 07, 2020. FINDINGS: Mediastinal blood pool activity: SUV max 2.68 Liver activity: SUV max NA NECK: Marked interval response to therapy. No substantial uptake in the anterior neck correspond to areas of bulky disease seen on the previous study. Only small lymph nodes are now evident in the neck. There is however a new focus of uptake along the RIGHT occiput in the soft tissues (image 15/4) interestingly this shows fatty density on the CT measuring approximally 7 mm but showing a  maximum SUV of 6.88. Largest remaining lymph node in the neck is in the RIGHT neck (image 38/4) this is at level II/III measuring 12 x 18 mm with a maximum SUV of 2.10 Mild uptake in the RIGHT parotid (image 27/4) some of this may be due to attenuation correction with maximum SUV of 3.0, perhaps a subtle lymph node in this area measuring 4-5 mm. No new lymph nodes in the anterior neck. Nasopharyngeal soft tissue also having diminished considerably, more normal appearance tracking towards the skull base without focal uptake on the current study. Incidental CT findings: Possible tiny small amount of gas in the RIGHT parotid and overlying the RIGHT masseter muscle. (Image 24/4 and image 22/4) CHEST: No hypermetabolic mediastinal or hilar nodes. No suspicious pulmonary nodules on the CT scan. Incidental CT findings: LEFT-sided Port-A-Cath in-situ. Noncontrast appearance of heart and great vessels remarkable only for scattered atherosclerotic plaque. No effusion. No consolidative process. ABDOMEN/PELVIS: No abnormal hypermetabolic activity within the liver, pancreas, adrenal glands, or spleen. No hypermetabolic  lymph nodes in the abdomen or pelvis. Incidental CT findings: G-tube in-situ. No acute findings relative to liver, gallbladder, spleen, pancreas, adrenal glands or kidneys. Bilateral nephrolithiasis with similar appearance to prior imaging. No hydronephrosis or perinephric stranding. Calcified atheromatous plaque of the abdominal aorta without aneurysmal changes. No acute gastrointestinal process. Appendix is normal. SKELETON: No focal hypermetabolic activity to suggest skeletal metastasis. Incidental CT findings: Spinal degenerative changes without acute or destructive bone process. IMPRESSION: Marked interval response to therapy. Near complete resolution of nodal enlargement seen on the previous study and no substantial FDG uptake in the anterior neck. New area of hypermetabolic activity corresponds to an area in the RIGHT occipital region which shows predominantly fatty density. Given the FDG uptake associated with this location would suggest focused ultrasound with biopsy as warranted for further evaluation. Given the intense nature of the uptake, metastatic disease in this area is strongly considered, CT imaging findings however are atypical for disease. Small amount of gas within the central parotid gland could even be within the parotid duct. Correlate with any symptoms on this side. No surrounding stranding. Aortic Atherosclerosis (ICD10-I70.0). Electronically Signed   By: Zetta Bills M.D.   On: 03/19/2021 10:15    Impression/Plan:    1) Head and Neck Cancer Status: Today with the patient I reviewed his pre and posttreatment PET imaging.  I believe he is in complete remission but out of an abundance of caution Anderson Malta will navigate him back to ENT to have this pustular lesion biopsied.  It was hypermetabolic on PET scan but I still am confident that pathology results will be benign.  2) Nutritional Status: He is doing well with an oral intake.  He will meet with medical oncology next week.  They  may want to delay removal of Port-A-Cath and feeding tube until biopsy results are back.  3) Risk Factors: The patient has been educated about risk factors including alcohol and tobacco abuse; they understand that avoidance of excessive  alcohol and any tobacco is important to prevent recurrences as well as other cancers.  He denies any smoking whatsoever and only drinks alcohol occasionally  4) Swallowing: continue SLP exercises -he denies any issues  5) Dental: Encouraged to continue regular followup with dentistry, and dental hygiene including fluoride rinses.   6) Thyroid function: check annually Lab Results  Component Value Date   TSH 0.818 10/08/2020    7) I will see him back in 4 months with surveillance imaging -  Anderson Malta will navigate PET scan.   On date of service, in total, I spent 30 minutes on this encounter. Patient was seen in person. _____________________________________   Eppie Gibson, MD

## 2021-03-21 NOTE — Progress Notes (Signed)
Department of Dental Medicine                LIMITED EXAM   Service Date:   03/21/2021  Patient Name:   Alan HERNANDO Sr. Date of Birth:   [birth year] Medical Record Number: [id number]   >  Plan/Recommendations <   Assessment Xerostomia, dysgeusia, dysphagia, weight loss  Recommendations Brush after meals and at bedtime. Use fluoride at bedtime. Use trismus exercises as directed. Use CLoSYs for dry mouth and warm salt water rinses as needed. Take multiple sips of water as needed.  Plan Return for comprehensive dental exam and cleaning. Rx: Sodium fluoride gel to use with fluoride trays Call if any questions or concerns arise.   03/21/2021 Progress Note:  COVID-19 SCREENING:  The patient denies symptoms concerning for COVID-19 infection including fever, chills, cough, or newly developed shortness of breath.   HISTORY OF PRESENT ILLNESS: Alan CHAPEL Sr. is a 65 y.o. male who presents today for an oral examination after radiation therapy for SCC of the nasopharynx/soft palate and for delivery of upper and lower fluoride trays.  The patient has completed 35 of 35 radiation treatments from 10/24/20 to 12/12/20.  They have completed 5 chemotherapy treatments.   REVIEW OF CHIEF COMPLAINTS: Dry mouth:  Yes. He reports this is his worst side effect and is taking sips of water throughout the day and using salt water/baking soda rinses as needed. Hard to swallow:  Yes, sometimes when he eats certain foods such as bread. Hurts to swallow:  No Taste changes:  Yes, taste has mostly returned but some foods still taste different. Sores in mouth:  No Limited opening:  No Weight loss:  Yes. 156.5 lbs down from 186 lbs   AT- HOME ORAL HYGIENE REGIMEN: Brushing:  Yes, twice a day. He is using [employer] toothpaste. Flossing:  No Rinsing:  Yes, sometimes uses Listerine. Fluoride:  Not currently.  Fluoride trays delivering to him today. Trismus exercises:  No Maximum Interincisal  Opening:  42 mm   Patient Active Problem List   Diagnosis Date Noted   Chemotherapy induced neutropenia ([place]) 11/27/2020   Mucositis due to antineoplastic therapy 11/13/2020   Chemotherapy induced nausea and vomiting 11/06/2020   Weight loss, unintentional 11/06/2020   Constipation 11/06/2020   Port-A-Cath in place 10/31/2020   Nasopharyngeal cancer ([place]) 10/23/2020   Nasopharynx cancer ([place]) 10/08/2020   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[place]) 10/04/2020   Essential hypertension 11/14/2019   Hyperlipidemia 11/14/2019   Family history of heart disease 11/14/2019   Chest pain of uncertain etiology [id number]   Nonspecific abnormal electrocardiogram (ECG) (EKG) 11/14/2019   Past Medical History:  Diagnosis Date   Chest pain    2021   History of kidney stones    Hypertension    Nasopharyngeal cancer ([place])    Pneumonia    Sleep apnea    Past Surgical History:  Procedure Laterality Date   FINE NEEDLE ASPIRATION BIOPSY     HERNIA REPAIR     Umbilicatl hernia   IR IMAGING GUIDED PORT INSERTION  10/18/2020   LAPAROSCOPIC INSERTION GASTROSTOMY TUBE N/A 10/23/2020   Procedure: LAPAROSCOPIC ASSISTED PEG TUBE;  Surgeon: Dwan Bolt, MD;  Location: [facility];  Service: General;  Laterality: N/A;  62   ROTATOR CUFF REPAIR     Torn Labrum     Current Outpatient Medications  Medication Sig Dispense Refill   amLODipine (NORVASC) 10 MG tablet Take 10 mg  by mouth daily.     atorvastatin (LIPITOR) 80 MG tablet Take 80 mg by mouth daily.     cetirizine (ZYRTEC) 10 MG tablet Take 10 mg by mouth daily. (Patient not taking: Reported on 02/05/2021)     docusate sodium (COLACE) 100 MG capsule Take 1 capsule (100 mg total) by mouth 2 (two) times daily. (Patient not taking: Reported on 02/05/2021) 60 capsule 0   ergocalciferol (VITAMIN D2) 1.25 MG (50000 UT) capsule Take 50,000 Units by mouth once a week. Wednesday     lidocaine (XYLOCAINE) 2 % solution Patient: Mix 1part 2% viscous lidocaine,  1part H20. Swish & swallow 74m of diluted mixture, 324m before meals and at bedtime, up to QID (Patient not taking: Reported on 02/05/2021) 200 mL 3   lidocaine-prilocaine (EMLA) cream Apply to affected area once (Patient not taking: Reported on 02/05/2021) 30 g 3   nitroGLYCERIN (NITROSTAT) 0.4 MG SL tablet Place 0.4 mg under the tongue every 5 (five) minutes x 3 doses as needed for chest pain.     Nutritional Supplements (KATE FARMS STANDARD 1.4) LIQD 1,950 mLs by Enteral route daily.     omeprazole (PRILOSEC) 20 MG capsule Take 20 mg by mouth daily.     sildenafil (VIAGRA) 50 MG tablet Take 50-100 mg by mouth daily as needed for erectile dysfunction.     No current facility-administered medications for this visit.   No Known Allergies   VITALS: BP 136/83 (BP Location: Right Arm, Patient Position: Sitting, Cuff Size: Normal)   Pulse 92   Temp 99.3 F (37.4 C) (Oral)   Wt 156 lb 8 oz (71 kg)   BMI 23.11 kg/m    DENTAL EXAM: Oral hygiene (plaque):  No Mucositis:  No Description of saliva:  Thick, viscous salivary secretions. Exposed bone:  No Other watched areas:  None   ASSESSMENT/DIAGNOSES:  Xerostomia:  Recommend trying CLoSYs mouthrinse/paste for dry mouth.  Coupons and samples of OTC brand given to the patient today. Dysgeusia:  Taste should continue to slowly return. Dysphagia:  This should improve as dry mouth is managed.  Multiple sips of water throughout the day and staying hydrated is important. Weight Loss:  He is still recovering following radiation therapy, but is starting to finally feel better in terms of eating and drinking.   PROCEDURES: Delivery of upper and lower fluoride trays. Appliances were tried in and adjusted as needed.  Polished. Postoperative instructions were provided in a written and verbal format concerning the use and care of appliances.   PLAN AND RECOMMENDATIONS: Brush after meals and at bedtime.  Use fluoride at bedtime. Use trismus  exercises as directed. Use CLoSYs for dry mouth and salt water/baking soda rinses for any mouth sores. Take multiple sips of water as needed. Rx: Sodium fluoride gel called in to [facility] pharmacy for fluoride trays. Return for routine dental care including cleaning and comprehensive exam. Call if any problems or concerns arise.   All questions and concerns were invited and addressed.  The patient tolerated today's visit well and departed in stable condition.  MaPleasant HillwBenson NorwayD.M.D.

## 2021-03-21 NOTE — Addendum Note (Signed)
Encounter addended by: Eppie Gibson, MD on: 03/21/2021 2:59 PM  Actions taken: Medication List reviewed, Problem List reviewed, Allergies reviewed, Clinical Note Signed, Level of Service modified

## 2021-04-02 ENCOUNTER — Other Ambulatory Visit (HOSPITAL_COMMUNITY): Payer: Self-pay

## 2021-04-03 ENCOUNTER — Other Ambulatory Visit: Payer: Self-pay

## 2021-04-03 ENCOUNTER — Telehealth: Payer: Self-pay | Admitting: Hematology and Oncology

## 2021-04-03 DIAGNOSIS — C119 Malignant neoplasm of nasopharynx, unspecified: Secondary | ICD-10-CM

## 2021-04-03 NOTE — Progress Notes (Signed)
Oncology Nurse Navigator Documentation   I spoke with Alan Henry today regarding his PEG tube. He tells me that he has not used it in one month and reports that his weight is stable. He weighed 157 lbs on 9/16 during his follow up with Dr.Squire. He reports that on 9/22 he weighed 157 lbs and today his weight was 161 lbs. He is feeling well. I have notified Dr. Chryl Heck of the above information and she has ordered for the PEG and PAC to be removed. Alan Henry is aware that he will be receiving an appointment date and time from Interventional Radiology. He also knows to call me if he has any further needs.   Harlow Asa RN, BSN, OCN Head & Neck Oncology Nurse [place] at [facility] Phone # [phone]  Fax # [phone]

## 2021-04-03 NOTE — Telephone Encounter (Signed)
Scheduled per sch msg. Called and spoke with patient. Confirmed new date and time  

## 2021-04-10 ENCOUNTER — Encounter: Payer: 59 | Admitting: Nutrition

## 2021-04-10 ENCOUNTER — Other Ambulatory Visit: Payer: 59

## 2021-04-10 ENCOUNTER — Ambulatory Visit: Payer: 59 | Admitting: Hematology and Oncology

## 2021-04-15 ENCOUNTER — Other Ambulatory Visit: Payer: 59

## 2021-04-15 ENCOUNTER — Encounter: Payer: 59 | Admitting: Dietician

## 2021-04-15 ENCOUNTER — Ambulatory Visit: Payer: 59 | Admitting: Hematology and Oncology

## 2021-04-17 ENCOUNTER — Inpatient Hospital Stay: Payer: 59 | Admitting: Dietician

## 2021-04-17 ENCOUNTER — Inpatient Hospital Stay: Payer: 59

## 2021-04-17 ENCOUNTER — Inpatient Hospital Stay: Payer: 59 | Admitting: Hematology and Oncology

## 2021-04-23 ENCOUNTER — Ambulatory Visit: Payer: 59

## 2021-04-24 ENCOUNTER — Ambulatory Visit: Payer: 59 | Admitting: Hematology and Oncology

## 2021-04-24 ENCOUNTER — Other Ambulatory Visit: Payer: 59

## 2021-04-24 ENCOUNTER — Ambulatory Visit: Payer: 59

## 2021-04-25 ENCOUNTER — Encounter: Payer: 59 | Admitting: Dietician

## 2021-04-30 ENCOUNTER — Encounter (HOSPITAL_COMMUNITY): Payer: Self-pay | Admitting: Dentistry

## 2021-04-30 ENCOUNTER — Ambulatory Visit (INDEPENDENT_AMBULATORY_CARE_PROVIDER_SITE_OTHER): Payer: Dental | Admitting: Dentistry

## 2021-04-30 ENCOUNTER — Other Ambulatory Visit: Payer: Self-pay | Admitting: Internal Medicine

## 2021-04-30 ENCOUNTER — Other Ambulatory Visit: Payer: Self-pay

## 2021-04-30 VITALS — BP 157/83 | HR 67 | Temp 98.8°F

## 2021-04-30 DIAGNOSIS — K029 Dental caries, unspecified: Secondary | ICD-10-CM

## 2021-04-30 DIAGNOSIS — Z0120 Encounter for dental examination and cleaning without abnormal findings: Secondary | ICD-10-CM

## 2021-04-30 DIAGNOSIS — K117 Disturbances of salivary secretion: Secondary | ICD-10-CM | POA: Diagnosis not present

## 2021-04-30 DIAGNOSIS — R432 Parageusia: Secondary | ICD-10-CM

## 2021-04-30 DIAGNOSIS — K032 Erosion of teeth: Secondary | ICD-10-CM

## 2021-04-30 DIAGNOSIS — K036 Deposits [accretions] on teeth: Secondary | ICD-10-CM | POA: Diagnosis not present

## 2021-04-30 DIAGNOSIS — Y842 Radiological procedure and radiotherapy as the cause of abnormal reaction of the patient, or of later complication, without mention of misadventure at the time of the procedure: Secondary | ICD-10-CM

## 2021-04-30 DIAGNOSIS — Z923 Personal history of irradiation: Secondary | ICD-10-CM

## 2021-04-30 DIAGNOSIS — M270 Developmental disorders of jaws: Secondary | ICD-10-CM

## 2021-04-30 DIAGNOSIS — K08109 Complete loss of teeth, unspecified cause, unspecified class: Secondary | ICD-10-CM

## 2021-04-30 DIAGNOSIS — K030 Excessive attrition of teeth: Secondary | ICD-10-CM

## 2021-04-30 DIAGNOSIS — R634 Abnormal weight loss: Secondary | ICD-10-CM

## 2021-04-30 DIAGNOSIS — K0530 Chronic periodontitis, unspecified: Secondary | ICD-10-CM

## 2021-04-30 DIAGNOSIS — R1310 Dysphagia, unspecified: Secondary | ICD-10-CM

## 2021-04-30 NOTE — Progress Notes (Signed)
Department of Dental Medicine            COMPREHENSIVE EXAM   Service Date:                04/30/2021  Patient Name:   Alan VERDELL Sr. Date of Birth:   07/22/1955 Medical Record Number: [id number]  Referring Provider:                   Eppie Gibson, M.D.   TODAY'S VISIT:   Procedures Comprehensive dental exam, adult prophylaxis, updated radiographs Assessment: Soft tissue:  Xerostomia Caries risk:  High Periodontal impression:  Accretions, areas of inflamed/erythematous tissue, teeth with bone loss and questionable periodontal prognosis Other findings:  Attrition, abfraction(s) Plan:  After a discussion of the risks and benefits of various treatment options, the following treatment plan was finalized: Adult prophy (completed today) Restorative Recall interval:  BW's-6-8 mos, prophy- 4 mos   04-30-2021 Progress Note:  COVID-19 SCREENING:  The patient denies symptoms concerning for COVID-19 infection including fever, chills, cough, or newly developed shortness of breath.    HISTORY OF PRESENT ILLNESS: Alan Leech Sr. is a very pleasant 65 y.o. male with h/o hyperlipidemia, hypertension and nasopharyngeal cancer s/p chemoradiation therapy who presents today for a comprehensive dental exam.   DENTAL HISTORY: The patient reports that his side effects since completing his radiation therapy have continued to improve.  He says that his dry mouth has slowly gotten better using the OTC brand that was recommended.  He also is taking multiple sips of water/staying hydrated throughout the day.     He currently denies any dental/orofacial pain or sensitivity. Patient is able to manage oral secretions.  Patient denies dysphagia, odynophagia, dysphonia, SOB and neck pain.  Patient denies fever, rigors and malaise.   CHIEF COMPLAINT: Here for a comprehensive dental exam and cleaning.   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/2020   Essential hypertension 11/14/2019   Hyperlipidemia 11/14/2019   Family history of heart disease 11/14/2019   Chest pain of uncertain etiology 15/17/6160   Nonspecific abnormal electrocardiogram (ECG) (EKG) 11/14/2019   Past Medical History:  Diagnosis Date   Chest pain    2021   History of kidney stones    Hypertension    Nasopharyngeal cancer ([place])    Pneumonia    Sleep apnea    Past Surgical History:  Procedure Laterality Date   FINE NEEDLE ASPIRATION BIOPSY     HERNIA REPAIR     Umbilicatl hernia   IR IMAGING GUIDED PORT INSERTION  10/18/2020   LAPAROSCOPIC INSERTION GASTROSTOMY TUBE N/A 10/23/2020   Procedure: LAPAROSCOPIC ASSISTED PEG TUBE;  Surgeon: Dwan Bolt, MD;  Location: [facility];  Service: General;  Laterality: N/A;  60   ROTATOR CUFF REPAIR     Torn Labrum     No Known Allergies Current Outpatient Medications  Medication Sig Dispense Refill   amLODipine (NORVASC) 10 MG tablet Take 10 mg by mouth daily.     atorvastatin (LIPITOR) 80 MG tablet Take 80 mg by mouth daily.  cetirizine (ZYRTEC) 10 MG tablet Take 10 mg by mouth daily. (Patient not taking: Reported on 02/05/2021)     docusate sodium (COLACE) 100 MG capsule Take 1 capsule (100 mg total) by mouth 2 (two) times daily. (Patient not taking: Reported on 02/05/2021) 60 capsule 0   ergocalciferol (VITAMIN D2) 1.25 MG (50000 UT) capsule Take 50,000 Units by mouth once a week. Wednesday     lidocaine (XYLOCAINE) 2 % solution Patient: Mix 1part 2% viscous lidocaine, 1part H20. Swish & swallow 2mL of diluted mixture, 35min before meals and at bedtime, up to  QID (Patient not taking: Reported on 02/05/2021) 200 mL 3   lidocaine-prilocaine (EMLA) cream Apply to affected area once (Patient not taking: Reported on 02/05/2021) 30 g 3   nitroGLYCERIN (NITROSTAT) 0.4 MG SL tablet Place 0.4 mg under the tongue every 5 (five) minutes x 3 doses as needed for chest pain.     Nutritional Supplements ([employer] STANDARD 1.4) LIQD 1,950 mLs by Enteral route daily.     omeprazole (PRILOSEC) 20 MG capsule Take 20 mg by mouth daily.     sildenafil (VIAGRA) 50 MG tablet Take 50-100 mg by mouth daily as needed for erectile dysfunction.     sodium fluoride (SODIUM FLUORIDE 5000 PPM) 1.1 % GEL dental gel Take 1 application by mouth at bedtime. Place 1 drop into each tooth space and leave in mouth for 5 minutes at bedtime.  Do not rinse with water, eat or drink for at least 30 minutes after use. 120 mL 11   No current facility-administered medications for this visit.    LABS: Lab Results  Component Value Date   WBC 3.0 (L) 02/05/2021   HGB 11.5 (L) 02/05/2021   HCT 35.4 (L) 02/05/2021   MCV 93.9 02/05/2021   PLT 193 02/05/2021      Component Value Date/Time   NA 141 02/05/2021 0920   K 3.8 02/05/2021 0920   CL 105 02/05/2021 0920   CO2 27 02/05/2021 0920   GLUCOSE 119 (H) 02/05/2021 0920   BUN 18 02/05/2021 0920   CREATININE 1.26 (H) 02/05/2021 0920   CALCIUM 9.7 02/05/2021 0920   GFRNONAA >60 02/05/2021 0920   GFRAA 34 (L) 11/20/2015 1436   No results found for: INR, PROTIME No results found for: PTT  Social History   Socioeconomic History   Marital status: Married    Spouse name: Not on file   Number of children: Not on file   Years of education: Not on file   Highest education level: Not on file  Occupational History   Not on file  Tobacco Use   Smoking status: Never   Smokeless tobacco: Never  Vaping Use   Vaping Use: Never used  Substance and Sexual Activity   Alcohol use: Not Currently    Comment: very rarely   Drug use: No   Sexual  activity: Not Currently  Other Topics Concern   Not on file  Social History Narrative   Not on file   Social Determinants of Health   Financial Resource Strain: Not on file  Food Insecurity: No Food Insecurity   Worried About Running Out of Food in the Last Year: Never true   Ran Out of Food in the Last Year: Never true  Transportation Needs: No Transportation Needs   Lack of Transportation (Medical): No   Lack of Transportation (Non-Medical): No  Physical Activity: Not on file  Stress: No Stress Concern Present   Feeling of Stress :  Not at all  Social Connections: Socially Integrated   Frequency of Communication with Friends and Family: More than three times a week   Frequency of Social Gatherings with Friends and Family: Twice a week   Attends Religious Services: More than 4 times per year   Active Member of [employer] or Organizations: Yes   Attends Archivist Meetings: 1 to 4 times per year   Marital Status: Married  Human resources officer Violence: Not on file   History reviewed. No pertinent family history.    REVIEW OF SYSTEMS:  Reviewed with the patient as per HPI. Psych: Patient denies having dental phobia.   VITAL SIGNS: BP (!) 157/83 (BP Location: Right Arm, Patient Position: Sitting, Cuff Size: Normal)   Pulse 67   Temp 98.8 F (37.1 C) (Oral)    PHYSICAL EXAM:  Soft tissue exam completed and charted.  General:  Well-developed, comfortable and in no apparent distress. Neurological:  Alert and oriented to person, place and  time. Extraoral:  Head size, head shape, facial symmetry, skin, complexion, pigmentation, conjunctiva, oral labia, parotid gland, submandibular gland, thyroid, cervical and preauricular lymph nodes, submandibular and submental lymph nodes, tonsillar and occipital lymph nodes and supraclavicular lymph nodes WNL. No swelling or lymphadenopathy.  TMJ asymptomatic without clicks or crepitations. Maximum Interincisal Opening:  42 mm (measured  on 9/16 at last visit) Intraoral:  Soft tissues appear well-perfused and mucous membranes moist.  FOM and vestibules soft and not raised. Oral cavity without mass or lesion. No signs of infection, parulis, sinus tract, edema or erythema evident upon exam.  Bilateral mandibular tori.   DENTAL EXAM:  Hard tissue and periodontal exam completed and charted.       Overall impression:  Fair remaining dentition.      Oral hygiene:  Good     Missing:  #16, #31  Caries: Questionable prognosis:  #17O, #32O Restorable:  #12DO, #13DO, #14DO, #18B(V)O, #3OL, #19DO, #29D, #30O, #32O Incipient lesions:  #4D, #5O, #66F(V), #49F(V), #68F(V), #20DO, #21D, #28O (continue to monitor) Endodontics:  #8 has been previously root canal treated Removable/Fixed Prosthodontics:  #8 full-coverage PFM crown Occlusion:  Class I molar occlusion on L side.  Non-functional teeth:  #2, #17   Supra-erupted teeth:  #2   Other:  #32 drifting towards the mesial Other findings:  (+) Attrition/wear:  #7, #9, #20 incisal; #23-#26 incisal   (+) Abfraction:  #5B(V), #29B(V) (+) #32 drifting towards the mesial; #3DL cusp chipped; ##19DL cusp chipped.    Periodontal:  Probing depths:  Range from 1-6 mm. Localized 7-8 mm depths on lingual of teeth #1 and #2. Plaque:  Slight, localized Calculus:  Generalized accumulation on interproximal surfaces of teeth and  Periodontally compromised teeth:  #1, #2 Gingival appearance:  Pink, healthy gingival tissue with blunted papilla with localized areas of inflamed and erythematous tissue.   - Exam performed by Alan Henry, D.M.D. with Leta Speller, DAII acting as scribe. -   RADIOGRAPHIC EXAM:  4 BW's were updated/exposed and interpreted.     Generalized mild horizontal bone loss consistent with mild periodontitis. Radiographic calculus accumulation evident.    Missing teeth #16 & #31. Existing restorations notable on #2 & #19. Pulp stones evident in pulp chambers of  posterior teeth.  Caries- #4D, #12D, #13D, #14D, #19D, #20D, #21D, #28D, #29D.   ASSESSMENT:  1. History of nasopharyngeal cancer 2. Status post chemoradiation therapy 3. Comprehensive dental exam and cleaning 4.  Missing teeth 5.  Caries 6.  Incipient  lesions 7.  Accretions on teeth 8.  Periodontitis 9.  Attrition/wear 10.  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  Dysgeusia 14.  Dysphagia 15.  Weight loss, unintentional   PROCEDURES: Adult prophylaxis.  Removed all supra- and subgingival calculus and plaque using Cavitron and hand instruments. Polished all teeth.  Flossed. Oral hygiene instruction discussed with the patient.  Encouraged better flossing and brushing at least twice daily. Toothbrush, toothpaste and floss given to the patient.   PLAN AND RECOMMENDATIONS: I discussed the risks, benefits, and complications of various scenarios with the patient in relationship to their medical and dental conditions.  I explained all significant findings of the dental consultation with the patient including multiple teeth with cavities and/or incipient cavities, areas of inflammation/bleeding of his gums due to tartar build-up and bone loss, and the recommended care including restorative treatment and 4 month recall visits to monitor incipient caries and his gums/overall oral health.  The patient verbalized understanding of all findings, discussion, treatment options and recommendations. We then discussed various treatment options to include no treatment, multiple extractions with alveoloplasty, pre-prosthetic surgery as indicated, periodontal therapy, dental restorations, root canal therapy, crown and bridge therapy, implant therapy, and replacement of missing teeth as indicated.   The patient verbalized understanding of all options, and currently wishes to proceed with the treatment plan below:  Adult prophy (completed  today) Restorative Recall interval: Prophy- 4 mos, Bitewings - 6-8 mos  Rx:  Sodium fluoride 1.1% gel to use with fluoride trays   NEXT VISIT:  Restorative per treatment plan   All questions and concerns were invited and addressed.  The patient tolerated today's visit well and departed in stable condition.  I spent in excess of 120 minutes during the conduct of this consultation and >50% of this time involved direct face-to-face encounter for counseling and/or coordination of the patient's care.      [place] Benson Henry, D.M.D.

## 2021-05-01 ENCOUNTER — Other Ambulatory Visit: Payer: Self-pay

## 2021-05-01 ENCOUNTER — Encounter (HOSPITAL_COMMUNITY): Payer: Self-pay

## 2021-05-01 ENCOUNTER — Ambulatory Visit (HOSPITAL_COMMUNITY)
Admission: RE | Admit: 2021-05-01 | Discharge: 2021-05-01 | Disposition: A | Discharge status: Home / Self Care | Payer: 59 | Source: Ambulatory Visit | Attending: Hematology and Oncology | Admitting: Hematology and Oncology

## 2021-05-01 DIAGNOSIS — Z452 Encounter for adjustment and management of vascular access device: Secondary | ICD-10-CM | POA: Insufficient documentation

## 2021-05-01 DIAGNOSIS — Z79899 Other long term (current) drug therapy: Secondary | ICD-10-CM | POA: Insufficient documentation

## 2021-05-01 DIAGNOSIS — C119 Malignant neoplasm of nasopharynx, unspecified: Secondary | ICD-10-CM | POA: Insufficient documentation

## 2021-05-01 HISTORY — PX: IR GASTROSTOMY TUBE REMOVAL: IMG5492

## 2021-05-01 HISTORY — PX: IR REMOVAL TUN ACCESS W/ PORT W/O FL MOD SED: IMG2290

## 2021-05-01 MED ORDER — FENTANYL CITRATE (PF) 100 MCG/2ML IJ SOLN
INTRAMUSCULAR | Status: AC | PRN
  Administered 2021-05-01: 25 ug via INTRAVENOUS

## 2021-05-01 MED ORDER — FENTANYL CITRATE (PF) 100 MCG/2ML IJ SOLN
INTRAMUSCULAR | Status: AC | PRN
  Administered 2021-05-01: 50 ug via INTRAVENOUS

## 2021-05-01 MED ORDER — MIDAZOLAM HCL 2 MG/2ML IJ SOLN
INTRAMUSCULAR | Status: AC | PRN
  Administered 2021-05-01: .5 mg via INTRAVENOUS

## 2021-05-01 MED ORDER — MIDAZOLAM HCL 2 MG/2ML IJ SOLN
INTRAMUSCULAR | Status: AC
  Filled 2021-05-01: qty 4

## 2021-05-01 MED ORDER — LIDOCAINE-EPINEPHRINE 1 %-1:100000 IJ SOLN
INTRAMUSCULAR | Status: AC
  Administered 2021-05-01: 8 mL via SUBCUTANEOUS
  Filled 2021-05-01: qty 1

## 2021-05-01 MED ORDER — FENTANYL CITRATE (PF) 100 MCG/2ML IJ SOLN
INTRAMUSCULAR | Status: AC
  Filled 2021-05-01: qty 2

## 2021-05-01 MED ORDER — SODIUM CHLORIDE 0.9 % IV SOLN: INTRAVENOUS | Status: DC

## 2021-05-01 MED ORDER — LIDOCAINE HCL (PF) 1 % IJ SOLN
INTRAMUSCULAR | Status: AC | PRN
  Administered 2021-05-01: 10 mL via INTRADERMAL

## 2021-05-01 MED ORDER — MIDAZOLAM HCL 2 MG/2ML IJ SOLN
INTRAMUSCULAR | Status: AC | PRN
  Administered 2021-05-01: 1 mg via INTRAVENOUS

## 2021-05-01 MED ORDER — LIDOCAINE VISCOUS HCL 2 % MT SOLN
OROMUCOSAL | Status: AC
  Administered 2021-05-01: 2 mL via GASTROSTOMY
  Filled 2021-05-01: qty 15

## 2021-05-01 NOTE — Discharge Instructions (Addendum)
Please call Interventional Radiology clinic [phone] with any questions or concerns.  You may remove your dressing and shower tomorrow and change daily until healed.  You may have Clear liquids for 4 hours ending at 4:30pm then eat light dinner.                                                                              Clear Liquids  Water  Fruit juices (non-citric and without pulp)  Carbonated beverages  Clear tea  Black coffee only  Gatorade  Plain Jell-O  Plain ice popsicles   Implanted Port Removal, Care After This sheet gives you information about how to care for yourself after your procedure. Your health care provider may also give you more specific instructions. If you have problems or questions, contact your health care provider. What can I expect after the procedure? After the procedure, it is common to have: Soreness or pain near your incision. Some swelling or bruising near your incision. Follow these instructions at home: Medicines Take over-the-counter and prescription medicines only as told by your health care provider. If you were prescribed an antibiotic medicine, take it as told by your health care provider. Do not stop taking the antibiotic even if you start to feel better. Bathing Do not take baths, swim, or use a hot tub until your health care provider approves. Ask your health care provider if you can take showers. You may only be allowed to take sponge baths. Incision care Follow instructions from your health care provider about how to take care of your incision. Make sure you: Wash your hands with soap and water before you change your bandage (dressing). If soap and water are not available, use hand sanitizer. Change your dressing as told by your health care provider. Keep your dressing dry. Leave stitches (sutures), skin glue, or adhesive strips in place. These skin closures may need to stay in place for 2 weeks or longer. If adhesive strip  edges start to loosen and curl up, you may trim the loose edges. Do not remove adhesive strips completely unless your health care provider tells you to do that. Check your incision area every day for signs of infection. Check for: More redness, swelling, or pain. More fluid or blood. Warmth. Pus or a bad smell.    Driving Do not drive for 24 hours if you were given a medicine to help you relax (sedative) during your procedure. If you did not receive a sedative, ask your health care provider when it is safe to drive.    Activity Return to your normal activities as told by your health care provider. Ask your health care provider what activities are safe for you. Do not lift anything that is heavier than 10 lb (4.5 kg), or the limit that you are told, until your health care provider says that it is safe. Do not do activities that involve lifting your arms over your head. General instructions Do not use any products that contain nicotine or tobacco, such as cigarettes and e-cigarettes. These can delay healing. If you need help quitting, ask your health care provider. Keep all follow-up visits as told by your health care provider. This is important. Contact  a health care provider if: You have more redness, swelling, or pain around your incision. You have more fluid or blood coming from your incision. Your incision feels warm to the touch. You have pus or a bad smell coming from your incision. You have pain that is not relieved by your pain medicine. Get help right away if you have: A fever or chills. Chest pain. Difficulty breathing. Summary After the procedure, it is common to have pain, soreness, swelling, or bruising near your incision. If you were prescribed an antibiotic medicine, take it as told by your health care provider. Do not stop taking the antibiotic even if you start to feel better. Do not drive for 24 hours if you were given a sedative during your procedure. Return to your  normal activities as told by your health care provider. Ask your health care provider what activities are safe for you. This information is not intended to replace advice given to you by your health care provider. Make sure you discuss any questions you have with your health care provider. Document Revised: 08/05/2017 Document Reviewed: 08/05/2017 Elsevier Patient Education  [place].   PEG Tube Removal, Care After This sheet gives you information about how to care for yourself after your procedure. Your health care provider may also give you more specific instructions. If you have problems or questions, contact your health care provider. What can I expect after the procedure? After the procedure, it is common to have: Mild discomfort at the opening in your skin where the tube was removed (tube removal site). A small amount of drainage from the opening. Follow these instructions at home: Care of the tube removal site Follow instructions from your health care provider about how to take care of your tube removal site. Make sure you: Wash your hands with soap and water for at least 20 seconds before and after you change your bandage (dressing). If soap and water are not available, use hand sanitizer. Change your dressing as told by your health care provider. Check your tube removal site every day for signs of infection. Check for: Redness, swelling, or pain. Fluid or blood. Warmth. Pus or a bad smell. Do not take baths, swim, or use a hot tub until your health care provider approves. Ask your health care provider if you may take showers. You may only be allowed to take sponge baths. Activity Return to your normal activities as told by your health care provider. Ask your health care provider what activities are safe for you. Do not lift anything that is heavier than 10 lb (4.5 kg), or the limit that you are told, until your health care provider says that it is safe. General  instructions Take over-the-counter and prescription medicines only as told by your health care provider. Follow instructions from your health care provider about eating and drinking. Keep all follow-up visits. This is important. Contact a health care provider if: You have a fever. You have pain in your abdomen. You have redness, swelling, or pain around your tube removal site. You have fluid or blood coming from your tube removal site. Your tube removal site feels warm to the touch. You have pus or a bad smell coming from your tube removal site. You have nausea or vomiting. Get help right away if: You have persistent bleeding from your tube removal site. You have severe pain in your abdomen. You are not able to eat or drink anything by mouth. Summary Follow instructions from your health  care provider about how to take care of your tube removal site. Do not take baths, swim, or use a hot tub until your health care provider approves. Ask your health care provider if you may take showers. You may only be allowed to take sponge baths. Check your tube removal site every day for signs of infection. Return to your normal activities as told by your health care provider. This information is not intended to replace advice given to you by your health care provider. Make sure you discuss any questions you have with your health care provider. Document Revised: 11/03/2019 Document Reviewed: 11/03/2019 Elsevier Patient Education  [place].   Moderate Conscious Sedation, Adult, Care After This sheet gives you information about how to care for yourself after your procedure. Your health care provider may also give you more specific instructions. If you have problems or questions, contact your health care provider. What can I expect after the procedure? After the procedure, it is common to have: Sleepiness for several hours. Impaired judgment for several hours. Difficulty with  balance. Vomiting if you eat too soon. Follow these instructions at home: For the time period you were told by your health care provider: Rest. Do not participate in activities where you could fall or become injured. Do not drive or use machinery. Do not drink alcohol. Do not take sleeping pills or medicines that cause drowsiness. Do not make important decisions or sign legal documents. Do not take care of children on your own.        Eating and drinking Follow the diet recommended by your health care provider. Drink enough fluid to keep your urine pale yellow. If you vomit: Drink water, juice, or soup when you can drink without vomiting. Make sure you have little or no nausea before eating solid foods.    General instructions Take over-the-counter and prescription medicines only as told by your health care provider. Have a responsible adult stay with you for the time you are told. It is important to have someone help care for you until you are awake and alert. Do not smoke. Keep all follow-up visits as told by your health care provider. This is important. Contact a health care provider if: You are still sleepy or having trouble with balance after 24 hours. You feel light-headed. You keep feeling nauseous or you keep vomiting. You develop a rash. You have a fever. You have redness or swelling around the IV site. Get help right away if: You have trouble breathing. You have new-onset confusion at home. Summary After the procedure, it is common to feel sleepy, have impaired judgment, or feel nauseous if you eat too soon. Rest after you get home. Know the things you should not do after the procedure. Follow the diet recommended by your health care provider and drink enough fluid to keep your urine pale yellow. Get help right away if you have trouble breathing or new-onset confusion at home. This information is not intended to replace advice given to you by your health care  provider. Make sure you discuss any questions you have with your health care provider. Document Revised: 10/20/2019 Document Reviewed: 05/18/2019 Elsevier Patient Education  2021 [employer].

## 2021-05-01 NOTE — Procedures (Signed)
Interventional Radiology Procedure Note  Procedure: port and Gtube removals    Complications: None  Estimated Blood Loss:  min  Findings: Full reports in pacs     Tamera Punt, MD

## 2021-05-01 NOTE — H&P (Signed)
Referring Physician(s): Alan Henry  Supervising Physician: Daryll Brod  Patient Status:  [facility]  Chief Complaint:  "I'm getting my port and stomach tube out"  Subjective: Patient familiar to IR service from right cervical lymph node biopsy on 08/30/2020 and Port-A-Cath placement on 10/18/2020.  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  He is currently eating a regular diet.  Note- patient's G-tube was placed by [facility] on 10/23/2020.  He presents today for both Port-A-Cath and gastrostomy tube removals.  He denies fever, headache, chest pain, dyspnea, cough, abdominal/back pain, nausea, vomiting or bleeding.  Past Medical History:  Diagnosis Date   Chest pain    2021   History of kidney stones    Hypertension    Nasopharyngeal cancer ([place])    Pneumonia    Sleep apnea    Past Surgical History:  Procedure Laterality Date   FINE NEEDLE ASPIRATION BIOPSY     HERNIA REPAIR     Umbilicatl hernia   IR IMAGING GUIDED PORT INSERTION  10/18/2020   LAPAROSCOPIC INSERTION GASTROSTOMY TUBE N/A 10/23/2020   Procedure: LAPAROSCOPIC ASSISTED PEG TUBE;  Surgeon: Dwan Bolt, MD;  Location: [facility];  Service: General;  Laterality: N/A;  60   ROTATOR CUFF REPAIR     Torn Labrum        Allergies: Patient has no known allergies.  Medications: Prior to Admission medications   Medication Sig Start Date End Date Taking? Authorizing Provider  amLODipine (NORVASC) 10 MG tablet Take 10 mg by mouth daily. 09/08/19  Yes [provider]  atorvastatin (LIPITOR) 80 MG tablet Take 80 mg by mouth daily.   Yes [provider]  ergocalciferol (VITAMIN D2) 1.25 MG (50000 UT) capsule Take 50,000 Units by mouth once a week. Wednesday   Yes [provider]  Nutritional Supplements (KATE FARMS STANDARD 1.4) LIQD 1,950 mLs by Enteral route daily. 12/06/20  Yes Alan Pike, MD  omeprazole (PRILOSEC) 20 MG capsule Take 20 mg by mouth daily.  06/23/19  Yes [provider]  sildenafil (VIAGRA) 50 MG tablet Take 50-100 mg by mouth daily as needed for erectile dysfunction. 08/01/20  Yes [provider]  cetirizine (ZYRTEC) 10 MG tablet Take 10 mg by mouth daily. Patient not taking: Reported on 02/05/2021    [provider]  docusate sodium (COLACE) 100 MG capsule Take 1 capsule (100 mg total) by mouth 2 (two) times daily. Patient not taking: Reported on 02/05/2021 10/24/20   Dwan Bolt, MD  lidocaine (XYLOCAINE) 2 % solution Patient: Mix 1part 2% viscous lidocaine, 1part H20. Swish & swallow 88mL of diluted mixture, 38min before meals and at bedtime, up to QID Patient not taking: Reported on 02/05/2021 10/28/20   Eppie Gibson, MD  lidocaine-prilocaine (EMLA) cream Apply to affected area once Patient not taking: Reported on 02/05/2021 10/08/20   Alan Pike, MD  nitroGLYCERIN (NITROSTAT) 0.4 MG SL tablet Place 0.4 mg under the tongue every 5 (five) minutes x 3 doses as needed for chest pain. 10/22/19   [provider]  sodium fluoride (SODIUM FLUORIDE 5000 PPM) 1.1 % GEL dental gel Take 1 application by mouth at bedtime. Place 1 drop into each tooth space and leave in mouth for 5 minutes at bedtime.  Do not rinse with water, eat or drink for at least 30 minutes after use. 03/21/21   Sandi Mariscal B, DMD     Vital Signs: BP (!) 156/105   Pulse 99  Temp 98.2 F (36.8 C) (Oral)   Resp 18   Ht 5' 9.5" (1.765 m)   Wt 161 lb (73 kg)   SpO2 99%   BMI 23.43 kg/m   Physical Exam awake, alert.  Chest clear to auscultation bilaterally.  Clean, intact left chest wall Port-A-Cath.  Heart with regular rate and rhythm.  Abdomen soft, positive bowel sounds, intact gastrostomy tube, site nontender.  No lower extremity edema.  Imaging: No results found.  Labs:  CBC: Recent Labs    12/04/20 1007 12/12/20 1013 01/08/21 0946 02/05/21 0920  WBC 0.7* 3.1* 4.2 3.0*  HGB 12.1* 13.0 11.7* 11.5*  HCT 36.1*  39.7 35.5* 35.4*  PLT 133* 311 155 193    COAGS: No results for input(s): INR, APTT in the last 8760 hours.  BMP: Recent Labs    12/04/20 1007 12/12/20 1013 01/08/21 0946 02/05/21 0920  NA 137 141 140 141  K 4.5 3.8 3.7 3.8  CL 97* 98 103 105  CO2 29 32 29 27  GLUCOSE 160* 168* 119* 119*  BUN 18 25* 20 18  CALCIUM 9.6 9.8 9.5 9.7  CREATININE 1.38* 1.60* 1.21 1.26*  GFRNONAA 58* 48* >60 >60    LIVER FUNCTION TESTS: Recent Labs    12/04/20 1007 12/12/20 1013 01/08/21 0946 02/05/21 0920  BILITOT 0.5 0.3 0.5 0.4  AST 11* 13* 14* 12*  ALT 9 12 9 10   ALKPHOS 75 75 64 69  PROT 7.3 8.0 7.2 7.3  ALBUMIN 3.3* 3.6 3.6 4.0    Assessment and Plan: Patient familiar to IR service from right cervical lymph node biopsy on 08/30/2020 and Port-A-Cath placement on 10/18/2020.  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  He is currently eating a regular diet.  Note- patient's G-tube was placed by [facility] on 10/23/2020.  He presents today for both Port-A-Cath and gastrostomy tube removals.  Details/risks of procedures, including but not limited to, internal bleeding, infection, injury to adjacent structures discussed with patient with his understanding and consent.   Electronically Signed: D. Rowe Robert, PA-C 05/01/2021, 11:14 AM   I spent a total of 20 minutes at the the patient's bedside AND on the patient's hospital floor or unit, greater than 50% of which was counseling/coordinating care for Port-A-Cath and gastrostomy tube removals

## 2021-05-07 ENCOUNTER — Encounter: Payer: Self-pay | Admitting: Hematology and Oncology

## 2021-05-07 ENCOUNTER — Other Ambulatory Visit: Payer: Self-pay | Admitting: Hematology and Oncology

## 2021-05-07 DIAGNOSIS — C119 Malignant neoplasm of nasopharynx, unspecified: Secondary | ICD-10-CM

## 2021-05-08 ENCOUNTER — Encounter: Payer: Self-pay | Admitting: Hematology and Oncology

## 2021-05-08 ENCOUNTER — Inpatient Hospital Stay: Payer: 59 | Attending: Hematology and Oncology | Admitting: Hematology and Oncology

## 2021-05-08 ENCOUNTER — Inpatient Hospital Stay: Payer: 59 | Admitting: Nutrition

## 2021-05-08 ENCOUNTER — Inpatient Hospital Stay: Payer: 59

## 2021-05-08 ENCOUNTER — Other Ambulatory Visit: Payer: Self-pay

## 2021-05-08 DIAGNOSIS — I890 Lymphedema, not elsewhere classified: Secondary | ICD-10-CM

## 2021-05-08 DIAGNOSIS — T451X5A Adverse effect of antineoplastic and immunosuppressive drugs, initial encounter: Secondary | ICD-10-CM

## 2021-05-08 DIAGNOSIS — R634 Abnormal weight loss: Secondary | ICD-10-CM

## 2021-05-08 DIAGNOSIS — C113 Malignant neoplasm of anterior wall of nasopharynx: Secondary | ICD-10-CM

## 2021-05-08 DIAGNOSIS — R293 Abnormal posture: Secondary | ICD-10-CM

## 2021-05-08 DIAGNOSIS — Z923 Personal history of irradiation: Secondary | ICD-10-CM | POA: Insufficient documentation

## 2021-05-08 DIAGNOSIS — T451X5D Adverse effect of antineoplastic and immunosuppressive drugs, subsequent encounter: Secondary | ICD-10-CM | POA: Diagnosis not present

## 2021-05-08 DIAGNOSIS — Z9221 Personal history of antineoplastic chemotherapy: Secondary | ICD-10-CM | POA: Diagnosis not present

## 2021-05-08 DIAGNOSIS — Z79899 Other long term (current) drug therapy: Secondary | ICD-10-CM | POA: Insufficient documentation

## 2021-05-08 DIAGNOSIS — C119 Malignant neoplasm of nasopharynx, unspecified: Secondary | ICD-10-CM

## 2021-05-08 DIAGNOSIS — I10 Essential (primary) hypertension: Secondary | ICD-10-CM | POA: Insufficient documentation

## 2021-05-08 DIAGNOSIS — D701 Agranulocytosis secondary to cancer chemotherapy: Secondary | ICD-10-CM | POA: Diagnosis not present

## 2021-05-08 LAB — CBC WITH DIFFERENTIAL/PLATELET
Abs Immature Granulocytes: 0 10*3/uL (ref 0.00–0.07)
Basophils Absolute: 0 10*3/uL (ref 0.0–0.1)
Basophils Relative: 0 %
Eosinophils Absolute: 0 10*3/uL (ref 0.0–0.5)
Eosinophils Relative: 0 %
HCT: 39.8 % (ref 39.0–52.0)
Hemoglobin: 13 g/dL (ref 13.0–17.0)
Immature Granulocytes: 0 %
Lymphocytes Relative: 9 %
Lymphs Abs: 0.3 10*3/uL — ABNORMAL LOW (ref 0.7–4.0)
MCH: 29.8 pg (ref 26.0–34.0)
MCHC: 32.7 g/dL (ref 30.0–36.0)
MCV: 91.3 fL (ref 80.0–100.0)
Monocytes Absolute: 0.3 10*3/uL (ref 0.1–1.0)
Monocytes Relative: 8 %
Neutro Abs: 2.9 10*3/uL (ref 1.7–7.7)
Neutrophils Relative %: 83 %
Platelets: 186 10*3/uL (ref 150–400)
RBC: 4.36 MIL/uL (ref 4.22–5.81)
RDW: 14.3 % (ref 11.5–15.5)
WBC: 3.4 10*3/uL — ABNORMAL LOW (ref 4.0–10.5)
nRBC: 0 % (ref 0.0–0.2)

## 2021-05-08 LAB — COMPREHENSIVE METABOLIC PANEL
ALT: 11 U/L (ref 0–44)
AST: 16 U/L (ref 15–41)
Albumin: 4.3 g/dL (ref 3.5–5.0)
Alkaline Phosphatase: 70 U/L (ref 38–126)
Anion gap: 9 (ref 5–15)
BUN: 22 mg/dL (ref 8–23)
CO2: 29 mmol/L (ref 22–32)
Calcium: 9.6 mg/dL (ref 8.9–10.3)
Chloride: 105 mmol/L (ref 98–111)
Creatinine, Ser: 1.18 mg/dL (ref 0.61–1.24)
GFR, Estimated: >60 mL/min (ref >60)
Glucose, Bld: 95 mg/dL (ref 70–99)
Potassium: 3.7 mmol/L (ref 3.5–5.1)
Sodium: 143 mmol/L (ref 135–145)
Total Bilirubin: 0.5 mg/dL (ref 0.3–1.2)
Total Protein: 7.7 g/dL (ref 6.5–8.1)

## 2021-05-08 NOTE — Assessment & Plan Note (Signed)
The patient has completed chemoradiation therapy He had complete response to treatment His feeding tube and port has been replaced His examination today is benign I recommend return visit to see his medical oncologist in 3 months We discussed issues regarding supportive care

## 2021-05-08 NOTE — Assessment & Plan Note (Signed)
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

## 2021-05-08 NOTE — Assessment & Plan Note (Signed)
He is able to gain weight with just full oral intake and is still taking nutritional supplement I recommend discontinuation of nutritional supplement once he has achieved normal weight around 170 pounds I checked the previous location of feeding tube and it appears to be healing well with healthy granulation tissue I recommend against using Neosporin and just use dry dressing over it I anticipated will heal fully within the next week or so

## 2021-05-08 NOTE — Assessment & Plan Note (Signed)
His blood count has improved dramatically with very mild residual leukopenia Observe closely for now

## 2021-05-08 NOTE — Progress Notes (Signed)
Brief nutrition follow up completed with patient after MD. Patient is S/P treatment for nasopharyngeal cancer of the soft palate. Patient had his G-tube removed on Oct 27. He reports he is eating well. The only food he struggles with is bread and pizza. He tolerates this with adding extra moisture. He continues to drink [employer] VHC, 4 cartons daily. Weight improved to 163.2 pounds. This is improved from 157.6 pounds Sept 13.  Food and nutrition related knowledge deficit resolved.  Patient encouraged to decrease ONS as oral intake increases and weight improves. Discussed healthy weight. Encouraged patient to contact RD for further questions. No follow up needed at this time.

## 2021-05-08 NOTE — Assessment & Plan Note (Signed)
He has profound lymphedema since completion of radiation treatment We discussed importance of neck exercises

## 2021-05-08 NOTE — Progress Notes (Signed)
[facility] FOLLOW-UP progress notes  Patient Care Team: Nolene Ebbs, MD as PCP - General (Internal Medicine) Nolene Ebbs, MD (Internal Medicine) Malmfelt, Stephani Police, RN as Oncology Nurse Navigator Eppie Gibson, MD as Consulting Physician (Radiation Oncology) [name], MD as Consulting Physician (Otolaryngology) Benay Pike, MD as Consulting Physician (Hematology and Oncology)  CHIEF COMPLAINTS/PURPOSE OF VISIT:  Nasopharyngeal cancer, status post chemoradiation therapy  HISTORY OF PRESENTING ILLNESS:  Alan Leech Sr. 65 y.o. male was transferred to my care after his prior physician is away from maternity leave I have reviewed his records extensively The patient was diagnosed with nasopharyngeal soft palate cancer earlier this year, status post chemoradiation therapy During the course of his treatment, it was complicated by mucositis, pancytopenia, malnutrition and weight loss His last PET/CT imaging showed complete response to therapy His port and feeding tube was removed approximately a week ago He placed a dressing over his feeding tube and placed Neosporin over the area He is able to swallow food 100% He complained of mild dry mouth but overall improving He denies significant nausea or vomiting He is gaining weight He is still using nutritional supplement on a regular basis Denies recent smoking or drinking He has noticed lymphedema around his neck  I reviewed the patient's records extensive and collaborated the history with the patient. Summary of his history is as follows: Oncology History  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[place])  08/06/2020 Initial Diagnosis   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[place])   08/06/2020 Initial Diagnosis   Alan Henry presented with six-month history of gradually enlarging bilateral lymph nodes with associated mild discomfort and pressure.   08/07/2020 Imaging   He had CT soft tissue neck done on  August 07, 2020 which showed nasopharyngeal soft tissue prominence, greatest soft tissue effacement of adjacent right parapharyngeal fat suspected to be at least enlarged right retropharyngeal lymph node.  Bulky bilateral cervical midline likely right intraparotid lymphadenopathy   08/30/2020 Pathology Results   Biopsy of right neck lymph node on 08/30/2020 revealed: squamous cell carcinoma, p16 positive. EBV ordered, negative.   09/27/2020 PET scan   1. There is intense FDG uptake within the area of increased soft tissue fullness in the posterior nasopharynx. Cannot exclude primary nasopharyngeal neoplasm. 2. Extensive, bulky bilateral FDG avid cervical adenopathy. Large FDG avid lymph node is also identified within the right parotid gland. Imaging findings compatible with metastatic adenopathy. 3. Subcentimeter right supraclavicular lymph node exhibits mild FDG uptake above background activity. Equivocal for nodal metastasis. No additional signs of thoracic, abdominal, or pelvic metastasis. No evidence for osseous metastatic disease.    10/04/2020 Cancer Staging   Staging form: Pharynx - Nasopharynx, AJCC 8th Edition - Clinical stage from 10/04/2020: Stage IVA (cT1, cN3, cM0) - Signed by Eppie Gibson, MD on 10/04/2020 Stage prefix: Initial diagnosis    10/11/2020 - 12/13/2020 Chemotherapy   The patient received weekly cisplatin with radiation   10/24/2020 - 12/12/2020 Radiation Therapy   Radiation Treatment Dates: 10/24/2020 through 12/12/2020 Site Technique Total Dose (Gy) Dose per Fx (Gy) Completed Fx Beam Energies  Neck: HN_NasoP IMRT 70/70 2 35/35 6X    03/18/2021 PET scan   Marked interval response to therapy. Near complete resolution of nodal enlargement seen on the previous study and no substantial FDG uptake in the anterior neck.    New area of hypermetabolic activity corresponds to an area in the RIGHT occipital region which shows predominantly fatty density. Given the FDG uptake associated  with this location would suggest focused ultrasound with biopsy as warranted for further evaluation. Given the intense nature of the uptake, metastatic disease in this area is strongly considered, CT imaging findings however are atypical for disease.   Small amount of gas within the central parotid gland could even be within the parotid duct. Correlate with any symptoms on this side. No surrounding stranding.   Aortic Atherosclerosis (ICD10-I70.0).     05/01/2021 Procedure   Successful 41 French gastrostomy removal     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[place])    Pneumonia    Sleep apnea     SURGICAL HISTORY: Past Surgical History:  Procedure Laterality Date   FINE NEEDLE ASPIRATION BIOPSY     HERNIA REPAIR     Umbilicatl hernia   IR GASTROSTOMY TUBE REMOVAL  05/01/2021   IR IMAGING GUIDED PORT INSERTION  10/18/2020   IR REMOVAL TUN ACCESS W/ PORT W/O FL MOD SED  05/01/2021   LAPAROSCOPIC INSERTION GASTROSTOMY TUBE N/A 10/23/2020   Procedure: LAPAROSCOPIC ASSISTED PEG TUBE;  Surgeon: Dwan Bolt, MD;  Location: [facility];  Service: General;  Laterality: N/A;  17   ROTATOR CUFF REPAIR     Torn Labrum      SOCIAL HISTORY: Social History   Socioeconomic History   Marital status: Married    Spouse name: Not on file   Number of children: Not on file   Years of education: Not on file   Highest education level: Not on file  Occupational History   Not on file  Tobacco Use   Smoking status: Never   Smokeless tobacco: Never  Vaping Use   Vaping Use: Never used  Substance and Sexual Activity   Alcohol use: Not Currently    Comment: very rarely   Drug use: No   Sexual activity: Not Currently  Other Topics Concern   Not on file  Social History Narrative   Not on file   Social Determinants of Health   Financial Resource Strain: Not on file  Food Insecurity:  No Food Insecurity   Worried About Running Out of Food in the Last Year: Never true   [place] in the Last Year: Never true  Transportation Needs: No Transportation Needs   Lack of Transportation (Medical): No   Lack of Transportation (Non-Medical): No  Physical Activity: Not on file  Stress: No Stress Concern Present   Feeling of Stress : Not at all  Social Connections: Socially Integrated   Frequency of Communication with Friends and Family: More than three times a week   Frequency of Social Gatherings with Friends and Family: Twice a week   Attends Religious Services: More than 4 times per year   Active Member of [employer] or Organizations: Yes   Attends Archivist Meetings: 1 to 4 times per year   Marital Status: Married  Human resources officer Violence: Not on file    FAMILY HISTORY: History reviewed. No pertinent family history.  ALLERGIES:  has No Known Allergies.  MEDICATIONS:  Current Outpatient Medications  Medication Sig Dispense Refill   amLODipine (NORVASC) 10 MG tablet Take 10 mg by mouth daily.     atorvastatin (LIPITOR) 80 MG tablet Take 80 mg by mouth daily.     cetirizine (ZYRTEC) 10 MG tablet Take 10 mg by mouth daily. (Patient  not taking: Reported on 02/05/2021)     docusate sodium (COLACE) 100 MG capsule Take 1 capsule (100 mg total) by mouth 2 (two) times daily. (Patient not taking: Reported on 02/05/2021) 60 capsule 0   ergocalciferol (VITAMIN D2) 1.25 MG (50000 UT) capsule Take 50,000 Units by mouth once a week. Wednesday     lidocaine (XYLOCAINE) 2 % solution Patient: Mix 1part 2% viscous lidocaine, 1part H20. Swish & swallow 11mL of diluted mixture, 71min before meals and at bedtime, up to QID (Patient not taking: Reported on 02/05/2021) 200 mL 3   nitroGLYCERIN (NITROSTAT) 0.4 MG SL tablet Place 0.4 mg under the tongue every 5 (five) minutes x 3 doses as needed for chest pain.     Nutritional Supplements (KATE FARMS STANDARD 1.4) LIQD 1,950 mLs by Enteral  route daily.     omeprazole (PRILOSEC) 20 MG capsule Take 20 mg by mouth daily.     sildenafil (VIAGRA) 50 MG tablet Take 50-100 mg by mouth daily as needed for erectile dysfunction.     sodium fluoride (SODIUM FLUORIDE 5000 PPM) 1.1 % GEL dental gel Take 1 application by mouth at bedtime. Place 1 drop into each tooth space and leave in mouth for 5 minutes at bedtime.  Do not rinse with water, eat or drink for at least 30 minutes after use. 120 mL 11   No current facility-administered medications for this visit.    REVIEW OF SYSTEMS:   Constitutional: Denies fevers, chills or abnormal night sweats Eyes: Denies blurriness of vision, double vision or watery eyes Ears, nose, mouth, throat, and face: Denies mucositis or sore throat Respiratory: Denies cough, dyspnea or wheezes Cardiovascular: Denies palpitation, chest discomfort or lower extremity swelling Gastrointestinal:  Denies nausea, heartburn or change in bowel habits Skin: Denies abnormal skin rashes Lymphatics: Denies new lymphadenopathy or easy bruising Neurological:Denies numbness, tingling or new weaknesses Behavioral/Psych: Mood is stable, no new changes  All other systems were reviewed with the patient and are negative.  PHYSICAL EXAMINATION: ECOG PERFORMANCE STATUS: 1 - Symptomatic but completely ambulatory  Vitals:   05/08/21 0951  BP: (!) 125/99  Pulse: 99  Resp: 18  Temp: 98.4 F (36.9 C)  SpO2: 100%   Filed Weights   05/08/21 0951  Weight: 163 lb 3.2 oz (74 kg)    GENERAL:alert, no distress and comfortable SKIN: skin color, texture, turgor are normal, no rashes or significant lesions EYES: normal, conjunctiva are pink and non-injected, sclera clear OROPHARYNX:no exudate, normal lips, buccal mucosa, and tongue.  Noted dry oropharynx NECK: Noted significant lymphedema around his neck.  No palpable lymphadenopathy.  Noted abnormal posture LYMPH:  no palpable lymphadenopathy in the cervical, axillary or  inguinal LUNGS: clear to auscultation and percussion with normal breathing effort HEART: regular rate & rhythm and no murmurs without lower extremity edema ABDOMEN:abdomen soft, non-tender and normal bowel sounds.  His feeding tube site looks clean with healthy granulation tissue.  I remove the bandage in place clean dressing over it Musculoskeletal:no cyanosis of digits and no clubbing  PSYCH: alert & oriented x 3 with fluent speech NEURO: no focal motor/sensory deficits  LABORATORY DATA:  I have reviewed the data as listed Lab Results  Component Value Date   WBC 3.4 (L) 05/08/2021   HGB 13.0 05/08/2021   HCT 39.8 05/08/2021   MCV 91.3 05/08/2021   PLT 186 05/08/2021   Recent Labs    01/08/21 0946 02/05/21 0920 05/08/21 0934  NA 140 141 143  K 3.7 3.8  3.7  CL 103 105 105  CO2 29 27 29   GLUCOSE 119* 119* 95  BUN 20 18 22   CREATININE 1.21 1.26* 1.18  CALCIUM 9.5 9.7 9.6  GFRNONAA >60 >60 >60  PROT 7.2 7.3 7.7  ALBUMIN 3.6 4.0 4.3  AST 14* 12* 16  ALT 9 10 11   ALKPHOS 64 69 70  BILITOT 0.5 0.4 0.5    RADIOGRAPHIC STUDIES: I have personally reviewed the radiological images as listed and agreed with the findings in the report. IR Removal Tun Access W/ Port W/O [place]  Result Date: 05/01/2021 CLINICAL DATA:  NASOPHARYNGEAL SQUAMOUS CELL CARCINOMA. COMPLETED THERAPY. EXAM: REMOVAL OF IMPLANTED TUNNELED PORT-A-CATH MEDICATIONS: 1% LIDOCAINE LOCAL. ANESTHESIA/SEDATION: Moderate (conscious) sedation was employed during this procedure. A total of Versed 1.5 mg and Fentanyl 75 mcg was administered intravenously. Moderate Sedation Time: 23 minutes. The patient's level of consciousness and vital signs were monitored continuously by radiology nursing throughout the procedure under my direct supervision. FLUOROSCOPY TIME:  None PROCEDURE: Informed written consent was obtained from the patient after a discussion of the risk, benefits and alternatives to the procedure. The patient was  positioned supine on the fluoroscopy table and the right chest Port-A-Cath site was prepped with chlorhexidine. A sterile gown and gloves were worn during the procedure. Local anesthesia was provided with 1% lidocaine with epinephrine. A timeout was performed prior to the initiation of the procedure. An incision was made overlying the Port-A-Cath with a #15 scalpel. Utilizing sharp and blunt dissection, the Port-A-Cath was removed completely. The pocked was irrigated with sterile saline. Wound closure was performed with subcutaneous 2-0 Vicryl, subcuticular 4-0 Vicryl, Dermabond and Steri-Strips. A dressing was placed. The patient tolerated the procedure well without immediate post procedural complication. FINDINGS: Successful removal of implant Port-A-Cath without immediate post procedural complication. IMPRESSION: Successful removal of implanted Port-A-Cath. Electronically Signed   By: [name].  Shick M.D.   On: 05/01/2021 13:32   IR GASTROSTOMY TUBE REMOVAL  Result Date: 05/01/2021 INDICATION: Nasopharyngeal squamous cell carcinoma. Completed therapy. Feeding tube no longer required. EXAM: IR gastrostomy tube removal MEDICATIONS: Viscous lidocaine local ANESTHESIA/SEDATION: Versed 1.5 mg IV; Fentanyl 75 mcg IV Moderate Sedation Time: 23 minutes (for both the port catheter removal and G-tube removal) The patient was continuously monitored during the procedure by the interventional radiology nurse under my direct supervision. CONTRAST:  None. FLUOROSCOPY TIME:  Fluoroscopy Time: None. COMPLICATIONS: None immediate. PROCEDURE: Informed written consent was obtained from the patient after a thorough discussion of the procedural risks, benefits and alternatives. All questions were addressed. Maximal Sterile Barrier Technique was utilized including caps, mask, sterile gowns, sterile gloves, sterile drape, hand hygiene and skin antiseptic. A timeout was performed prior to the initiation of the procedure. Using manual  retraction the gastrostomy was removed without difficulty intact. IMPRESSION: Successful 20 French gastrostomy removal. Electronically Signed   By: [name].  Shick M.D.   On: 05/01/2021 13:34    ASSESSMENT & PLAN:  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[facility]) The patient has completed chemoradiation therapy He had complete response to treatment His feeding tube and port has been replaced His examination today is benign I recommend return visit to see his medical oncologist in 3 months We discussed issues regarding supportive care  Acquired lymphedema He has profound lymphedema since completion of radiation treatment We discussed importance of neck exercises  Leukopenia due to antineoplastic chemotherapy ([place]) His blood count has improved dramatically with very mild residual leukopenia Observe closely for now  Abnormal posture He has  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 Thankfully, he retained reasonable full range of motion We discussed importance of frequent neck exercise and to change his posture to reduce the risk of chronic neck deformity  Weight loss, unintentional He is able to gain weight with just full oral intake and is still taking nutritional supplement I recommend discontinuation of nutritional supplement once he has achieved normal weight around 170 pounds I checked the previous location of feeding tube and it appears to be healing well with healthy granulation tissue I recommend against using Neosporin and just use dry dressing over it I anticipated will heal fully within the next week or so  No orders of the defined types were placed in this encounter.   All questions were answered. The patient knows to call the clinic with any problems, questions or concerns. The total time spent in the appointment was 30 minutes encounter with patients including review of chart and various tests results, discussions about plan of care and coordination of care plan   Heath Lark, MD 05/08/2021 3:46 PM

## 2021-05-20 DIAGNOSIS — Z0120 Encounter for dental examination and cleaning without abnormal findings: Secondary | ICD-10-CM | POA: Insufficient documentation

## 2021-05-20 DIAGNOSIS — K0530 Chronic periodontitis, unspecified: Secondary | ICD-10-CM | POA: Insufficient documentation

## 2021-05-20 DIAGNOSIS — K030 Excessive attrition of teeth: Secondary | ICD-10-CM | POA: Insufficient documentation

## 2021-05-20 DIAGNOSIS — M270 Developmental disorders of jaws: Secondary | ICD-10-CM | POA: Insufficient documentation

## 2021-05-20 DIAGNOSIS — K029 Dental caries, unspecified: Secondary | ICD-10-CM | POA: Insufficient documentation

## 2021-05-20 DIAGNOSIS — Z923 Personal history of irradiation: Secondary | ICD-10-CM | POA: Insufficient documentation

## 2021-05-20 DIAGNOSIS — K032 Erosion of teeth: Secondary | ICD-10-CM | POA: Insufficient documentation

## 2021-05-20 DIAGNOSIS — Y842 Radiological procedure and radiotherapy as the cause of abnormal reaction of the patient, or of later complication, without mention of misadventure at the time of the procedure: Secondary | ICD-10-CM | POA: Insufficient documentation

## 2021-05-20 DIAGNOSIS — R432 Parageusia: Secondary | ICD-10-CM | POA: Insufficient documentation

## 2021-05-20 DIAGNOSIS — K08109 Complete loss of teeth, unspecified cause, unspecified class: Secondary | ICD-10-CM | POA: Insufficient documentation

## 2021-05-20 DIAGNOSIS — R1310 Dysphagia, unspecified: Secondary | ICD-10-CM | POA: Insufficient documentation

## 2021-05-20 DIAGNOSIS — K036 Deposits [accretions] on teeth: Secondary | ICD-10-CM | POA: Insufficient documentation

## 2021-05-22 ENCOUNTER — Other Ambulatory Visit (HOSPITAL_COMMUNITY): Payer: Self-pay

## 2021-06-11 ENCOUNTER — Encounter (HOSPITAL_COMMUNITY): Payer: 59 | Admitting: Dentistry

## 2021-07-09 ENCOUNTER — Encounter (HOSPITAL_COMMUNITY): Payer: 59 | Admitting: Dentistry

## 2021-07-15 ENCOUNTER — Other Ambulatory Visit (HOSPITAL_COMMUNITY): Payer: Self-pay

## 2021-07-21 ENCOUNTER — Other Ambulatory Visit: Payer: Self-pay

## 2021-07-21 ENCOUNTER — Ambulatory Visit (HOSPITAL_COMMUNITY)
Admission: RE | Admit: 2021-07-21 | Discharge: 2021-07-21 | Disposition: A | Discharge status: Home / Self Care | Payer: 59 | Source: Ambulatory Visit | Attending: Radiation Oncology | Admitting: Radiation Oncology

## 2021-07-21 DIAGNOSIS — C113 Malignant neoplasm of anterior wall of nasopharynx: Secondary | ICD-10-CM | POA: Insufficient documentation

## 2021-07-21 LAB — GLUCOSE, CAPILLARY: Glucose-Capillary: 108 mg/dL — ABNORMAL HIGH (ref 70–99)

## 2021-07-21 MED ORDER — FLUDEOXYGLUCOSE F - 18 (FDG) INJECTION
8.1000 | Freq: Once | INTRAVENOUS | Status: AC
  Administered 2021-07-21: 8.1 via INTRAVENOUS

## 2021-07-23 ENCOUNTER — Ambulatory Visit
Admission: RE | Admit: 2021-07-23 | Discharge: 2021-07-23 | Disposition: A | Discharge status: Home / Self Care | Payer: 59 | Source: Ambulatory Visit | Attending: Radiation Oncology | Admitting: Radiation Oncology

## 2021-07-23 ENCOUNTER — Other Ambulatory Visit: Payer: Self-pay

## 2021-07-23 ENCOUNTER — Telehealth: Payer: Self-pay | Admitting: *Deleted

## 2021-07-23 ENCOUNTER — Telehealth: Payer: Self-pay

## 2021-07-23 VITALS — BP 151/101 | HR 97 | Temp 97.8°F | Resp 20 | Ht 69.5 in | Wt 174.4 lb

## 2021-07-23 DIAGNOSIS — Z85819 Personal history of malignant neoplasm of unspecified site of lip, oral cavity, and pharynx: Secondary | ICD-10-CM | POA: Diagnosis present

## 2021-07-23 DIAGNOSIS — C113 Malignant neoplasm of anterior wall of nasopharynx: Secondary | ICD-10-CM

## 2021-07-23 DIAGNOSIS — Z1329 Encounter for screening for other suspected endocrine disorder: Secondary | ICD-10-CM

## 2021-07-23 DIAGNOSIS — I700 Atherosclerosis of aorta: Secondary | ICD-10-CM | POA: Diagnosis not present

## 2021-07-23 DIAGNOSIS — Z79899 Other long term (current) drug therapy: Secondary | ICD-10-CM | POA: Insufficient documentation

## 2021-07-23 LAB — TSH: TSH: 1.736 u[IU]/mL (ref 0.320–4.118)

## 2021-07-23 NOTE — Progress Notes (Signed)
Mr. Froning presents today for follow-up after completing radiation to his nasopharynx on 12/12/2020 and to review PET scan results from 07/21/2021  Pain issues, if any: Patient denies Using a feeding tube?: N/A--removed 05/01/2021 Weight changes, if any:  Wt Readings from Last 3 Encounters:  07/23/21 174 lb 6.4 oz (79.1 kg)  05/08/21 163 lb 3.2 oz (74 kg)  05/01/21 161 lb (73 kg)   Swallowing issues, if any: Reports occasional difficuly with bread procu. As long as he has liquid while eating Smoking or chewing tobacco? None Using fluoride trays daily? F/U with Dr. Sandi Mariscal 07/25/2021 Last ENT visit was on: 04/30/2021 Saw Dr. [name] "Impression & Plans:  --The patient returns for follow-up appointment of an area of concern along the posterior occiput.  --This enhanced on CT PET scan and the patient had active symptoms of infection which was treated with antibiotics and topical wound care.  --This has resolved and healed, nontender and no erythema. No further work-up or treatment at this time based on his response to topical treatment.  --Plan follow-up as needed. He is scheduled to undergo PEG tube and Port-A-Cath removal tomorrow and will follow-up with oncology next week.  --He is also scheduled follow-up with Dr. Isidore Moos with repeat PET CT scan in January."  Other notable issues, if any: Reports occasional swelling to neck/under chin. Diligent about doing his massaging and PT exercises. Interested in going back to PT to get fitted for compression garment. Overall doing reall well, and pleased with his continued progress/improvement

## 2021-07-23 NOTE — Telephone Encounter (Signed)
-----   Message from Eppie Gibson, MD sent at 07/23/2021  3:58 PM EST ----- Can you let him know TSH looks great? He was wondering..thanks! ----- Message ----- From: Interface, Lab In [place] Sent: 07/23/2021  12:33 PM EST To: Eppie Gibson, MD

## 2021-07-23 NOTE — Telephone Encounter (Signed)
CALLED PATIENT TO ASK ABOUT HAVING A LAB TODAY, PATIENT CHOSE TO HAVE HIS LAB ON 07-23-21 @ 11 AM

## 2021-07-23 NOTE — Telephone Encounter (Signed)
Called patient and relayed TSH result from today. Patient verbalized understanding and appreciation of call. No other needs identified at this time

## 2021-07-25 ENCOUNTER — Other Ambulatory Visit: Payer: Self-pay

## 2021-07-25 ENCOUNTER — Encounter (HOSPITAL_COMMUNITY): Payer: Self-pay | Admitting: Dentistry

## 2021-07-25 ENCOUNTER — Ambulatory Visit (INDEPENDENT_AMBULATORY_CARE_PROVIDER_SITE_OTHER): Payer: Dental | Admitting: Dentistry

## 2021-07-25 ENCOUNTER — Encounter: Payer: Self-pay | Admitting: Radiation Oncology

## 2021-07-25 VITALS — BP 136/90 | HR 64 | Temp 98.8°F

## 2021-07-25 DIAGNOSIS — K029 Dental caries, unspecified: Secondary | ICD-10-CM | POA: Diagnosis not present

## 2021-07-25 NOTE — Progress Notes (Signed)
Department of Dental Medicine    Service Date:   07/25/2021  Patient Name:  Alan MUCCIO Sr. Date of Birth:   [birth year] Medical Record Number: [id number]   TODAY'S VISIT: RESTORATIVE   DIAGNOSIS: Teeth #2 & #3 caries PROCEDURES: Restorative on #2 & #3 PLAN: Next visit:  Continue w/ restorative per treatment plan       07/25/2021  PROGRESS NOTE:    COVID-19 SCREENING:  The patient denies symptoms concerning for COVID-19 infection including fever, chills, cough, or newly developed shortness of breath.   HISTORY OF PRESENT ILLNESS: Alan Henry Sr. is a 66 y.o. male who presents today for restorative treatment on teeth numbers 2 and 3 per treatment plan. Medical and dental history reviewed with the patient.  No changes reported.   CHIEF COMPLAINT:  Here for a routine dental appointment; patient with no complaints.   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   Chronic periodontitis 05/20/2021   Incipient enamel caries 05/20/2021   Abfraction 05/20/2021   Acquired lymphedema 05/08/2021   Abnormal posture 05/08/2021   Leukopenia due to antineoplastic chemotherapy (Alan Henry) 11/27/2020   Chemotherapy induced nausea and vomiting 11/06/2020   Weight loss, unintentional 11/06/2020   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   Essential hypertension 11/14/2019   Hyperlipidemia 11/14/2019   Family history of heart disease 11/14/2019   Chest pain of uncertain etiology 88/82/8003   Nonspecific abnormal electrocardiogram (ECG) (EKG) 11/14/2019   Past Medical History:   Diagnosis Date   Chest pain    2021   History of kidney stones    Hypertension    Nasopharyngeal cancer (Alan Henry)    Pneumonia    Sleep apnea    Past Surgical History:  Procedure Laterality Date   FINE NEEDLE ASPIRATION BIOPSY     HERNIA REPAIR     Umbilicatl hernia   IR GASTROSTOMY TUBE REMOVAL  05/01/2021   IR IMAGING GUIDED PORT INSERTION  10/18/2020   IR REMOVAL TUN ACCESS W/ PORT W/O FL MOD SED  05/01/2021   LAPAROSCOPIC INSERTION GASTROSTOMY TUBE N/A 10/23/2020   Procedure: LAPAROSCOPIC ASSISTED PEG TUBE;  Surgeon: Dwan Bolt, MD;  Location: [facility];  Service: General;  Laterality: N/A;  60   ROTATOR CUFF REPAIR     Torn Labrum     No Known Allergies Current Outpatient Medications  Medication Sig Dispense Refill   amLODipine (NORVASC) 10 MG tablet Take 10 mg by mouth daily.     atorvastatin (LIPITOR) 80 MG tablet Take 80 mg by mouth daily.     cetirizine (ZYRTEC) 10 MG tablet Take 10 mg by mouth daily. (Patient not taking: Reported on 02/05/2021)     ergocalciferol (VITAMIN D2) 1.25 MG (50000 UT) capsule Take 50,000 Units by mouth once a week. Wednesday     nitroGLYCERIN (NITROSTAT) 0.4 MG SL tablet Place 0.4 mg under the tongue every 5 (five) minutes x 3 doses as needed for chest pain.     omeprazole (PRILOSEC) 20 MG capsule Take 20 mg by mouth daily.     sildenafil (VIAGRA) 50 MG tablet  Take 50-100 mg by mouth daily as needed for erectile dysfunction.     sodium fluoride (SODIUM FLUORIDE 5000 PPM) 1.1 % GEL dental gel Take 1 application by mouth at bedtime. Place 1 drop into each tooth space and leave in mouth for 5 minutes at bedtime.  Do not rinse with water, eat or drink for at least 30 minutes after use. 120 mL 11   No current facility-administered medications for this visit.    LABS: Lab Results  Component Value Date   WBC 3.4 (L) 05/08/2021   HGB 13.0 05/08/2021   HCT 39.8 05/08/2021   MCV 91.3 05/08/2021   PLT 186 05/08/2021      Component Value Date/Time    NA 143 05/08/2021 0934   K 3.7 05/08/2021 0934   CL 105 05/08/2021 0934   CO2 29 05/08/2021 0934   GLUCOSE 95 05/08/2021 0934   BUN 22 05/08/2021 0934   CREATININE 1.18 05/08/2021 0934   CREATININE 1.26 (H) 02/05/2021 0920   CALCIUM 9.6 05/08/2021 0934   GFRNONAA >60 05/08/2021 0934   GFRNONAA >60 02/05/2021 0920   GFRAA 34 (L) 11/20/2015 1436   No results found for: INR, PROTIME No results found for: PTT  Social History   Socioeconomic History   Marital status: Married    Spouse name: Not on file   Number of children: Not on file   Years of education: Not on file   Highest education level: Not on file  Occupational History   Not on file  Tobacco Use   Smoking status: Never   Smokeless tobacco: Never  Vaping Use   Vaping Use: Never used  Substance and Sexual Activity   Alcohol use: Not Currently    Comment: very rarely   Drug use: No   Sexual activity: Not Currently  Other Topics Concern   Not on file  Social History Narrative   Not on file   Social Determinants of Health   Financial Resource Strain: Not on file  Food Insecurity: No Food Insecurity   Worried About Running Out of Food in the Last Year: Never true   Ran Out of Food in the Last Year: Never true  Transportation Needs: No Transportation Needs   Lack of Transportation (Medical): No   Lack of Transportation (Non-Medical): No  Physical Activity: Not on file  Stress: No Stress Concern Present   Feeling of Stress : Not at all  Social Connections: Socially Integrated   Frequency of Communication with Friends and Family: More than three times a week   Frequency of Social Gatherings with Friends and Family: Twice a week   Attends Religious Services: More than 4 times per year   Active Member of [employer] or Organizations: Yes   Attends Archivist Meetings: 1 to 4 times per year   Marital Status: Married  Human resources officer Violence: Not on file   No family history on file.   ANTIBIOTIC  PROPHYLAXIS/OTHER PREMEDICATION: None indicated.   VITAL SIGNS: BP 136/90 (BP Location: Right Arm, Patient Position: Sitting, Cuff Size: Normal)    Pulse 64    Temp 98.8 F (37.1 C) (Oral)    ASSESSMENT/INDICATION(S): Dental caries   PROCEDURES: Restorative treatment on teeth #2 DO and #3 OL. Anesthesia: Topical:  Benzocaine 20% applied Type of anesthesia used:  34 mg lidocaine, 0.018 mg epinephrine Location given:  #2 and #3 infiltration Aspiration negative. Composite restoration (#3) and Fuji restoration (#2): Cotton roll isolation. Excavated decay from teeth numbers 2 and  3. Teeth numbers 2 DO and 3 OL prepared for composite. The extent of caries was into dentin on #3. There was a pinpoint carious pulpal exposure on #2.  Bleeding was stopped almost immediately and all caries was removed from #2.  #2 DO:  Placed Dycal over pulpal exposure on tooth #2.  Vitrabond was then mixed, placed over Dycal and areas of pulpal blushing on and light-cured.  Cavity conditioner was placed and lightly air dried.  Fuji II material was then placed in increments and light-cured. Removed excess material with carbide finishing bur. Occlusion, margins and contact were verified and adjusted as needed.  #3 OL:  Etched enamel and dentin surfaces with 32% phosphoric acid for 15 seconds and rinsed thoroughly.  Removed excess water with a brief burst of air. Optibond bonding agent placed and air dried until no movement of bonding agent was seen and then light cured for 10 seconds. OMNICHROMA flowable composite material was then placed in increments and light-cured.  Occlusion and margins were verified and adjusted as needed. Restorations were finished and polished. The patient was advised of possible normal sensitivity to hot and cold for the next few days/weeks.  Discussed extent of decay on tooth #2 and how the cavity was into the nerve, but because bleeding stopped and all caries was removed the tooth will  hopefully remain asymptomatic and heal over time.  We also discussed that if he does become symptomatic (pain or severe increasing sensitivity to hot or cold) then the tooth may need to be extracted in the future.  He verbalized understanding of discussion and plan.   PLAN:  Next visit:  Continue with restorative per treatment plan  All questions and concerns were invited and addressed.  The patient tolerated today's visit well and departed in stable condition.  Charlaine Dalton, D.M.D.

## 2021-07-25 NOTE — Progress Notes (Signed)
Radiation Oncology         [phone]) [phone] ________________________________  Name: Alan Leech Sr. MRN: [id number]  Date: 07/23/2021  DOB: 06/14/1956  Follow-Up Visit Note  CC: Alan Ebbs, MD  Alan Belfast, MD  Diagnosis and Prior Radiotherapy:    C11.3   ICD-10-CM   1. 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 Ambulatory referral to Physical Therapy    CT Soft Tissue Neck W Contrast    CT Chest W Contrast     Cancer Staging  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[place]) Staging form: Pharynx - Nasopharynx, AJCC 8th Edition - Clinical stage from 10/04/2020: Stage IVA (cT1, cN3, cM0) - Signed by Alan Gibson, MD on 10/04/2020 Stage prefix: Initial diagnosis    CHIEF COMPLAINT:  Here for follow-up and surveillance of nasopharyngeal cancer  Narrative:   [name] presents today for follow-up after completing radiation to his nasopharynx on 12/12/2020 and to review PET scan results from 07/21/2021  Pain issues, if any: Patient denies Using a feeding tube?: N/A--removed 05/01/2021 Weight changes, if any:  Wt Readings from Last 3 Encounters:  07/23/21 174 lb 6.4 oz (79.1 kg)  05/08/21 163 lb 3.2 oz (74 kg)  05/01/21 161 lb (73 kg)   Swallowing issues, if any: Reports occasional difficuly with bread procu. As long as he has liquid while eating Smoking or chewing tobacco? None Using fluoride trays daily? F/U with Dr. Sandi Henry 07/25/2021 Last ENT visit was on: 04/30/2021 Saw Dr. Jerrell Henry "Impression & Plans:  --The patient returns for follow-up appointment of an area of concern along the posterior occiput.  --This enhanced on CT PET scan and the patient had active symptoms of infection which was treated with antibiotics and topical wound care.  --This has resolved and healed, nontender and no erythema. No further work-up or treatment at this time based on his response to topical treatment.  --Plan follow-up as needed. He is scheduled to undergo PEG tube and  Port-A-Cath removal tomorrow and will follow-up with oncology next week.  --He is also scheduled follow-up with Dr. Isidore Henry with repeat PET CT scan in January."  Other notable issues, if any: Reports occasional swelling to neck/under chin. Diligent about doing his massaging and PT exercises. Interested in going back to PT to get fitted for compression garment. Overall doing reall well, and pleased with his continued progress/improvement  Imaging reviewed my me and ENT board this AM - NED.   Meds: Current Outpatient Medications  Medication Sig Dispense Refill   amLODipine (NORVASC) 10 MG tablet Take 10 mg by mouth daily.     atorvastatin (LIPITOR) 80 MG tablet Take 80 mg by mouth daily.     cetirizine (ZYRTEC) 10 MG tablet Take 10 mg by mouth daily. (Patient not taking: Reported on 02/05/2021)     ergocalciferol (VITAMIN D2) 1.25 MG (50000 UT) capsule Take 50,000 Units by mouth once a week. Wednesday     nitroGLYCERIN (NITROSTAT) 0.4 MG SL tablet Place 0.4 mg under the tongue every 5 (five) minutes x 3 doses as needed for chest pain.     omeprazole (PRILOSEC) 20 MG capsule Take 20 mg by mouth daily.     sildenafil (VIAGRA) 50 MG tablet Take 50-100 mg by mouth daily as needed for erectile dysfunction.     sodium fluoride (SODIUM FLUORIDE 5000 PPM) 1.1 % GEL dental gel Take 1 application by mouth at bedtime. Place 1 drop into each tooth space and leave in mouth for 5 minutes  at bedtime.  Do not rinse with water, eat or drink for at least 30 minutes after use. 120 mL 11   No current facility-administered medications for this encounter.    Physical Findings: The patient is in no acute distress. Patient is alert and oriented. Wt Readings from Last 3 Encounters:  07/23/21 174 lb 6.4 oz (79.1 kg)  05/08/21 163 lb 3.2 oz (74 kg)  05/01/21 161 lb (73 kg)    height is 5' 9.5" (1.765 m) and weight is 174 lb 6.4 oz (79.1 kg). His temperature is 97.8 F (36.6 C). His blood pressure is 151/101  (abnormal) and his pulse is 97. His respiration is 20 and oxygen saturation is 99%. .  General: Alert and oriented, in no acute distress HEENT: Head is normocephalic. Extraocular movements are intact.  No lesions in the mouth or upper throat  Neck: No palpable lymphadenopathy in the cervical or supraclavicular neck.    Skin: In the right occipital neck the pustular subcutaneous lesion has completely resolved. HEART RRR CHEST CTAB MSK: Normal muscle tone, ambulatory Psychiatric: Judgment and insight are intact. Affect is appropriate.   Lab Findings: Lab Results  Component Value Date   WBC 3.4 (L) 05/08/2021   HGB 13.0 05/08/2021   HCT 39.8 05/08/2021   MCV 91.3 05/08/2021   PLT 186 05/08/2021    Lab Results  Component Value Date   TSH 1.736 07/23/2021    Radiographic Findings: NM PET Image Restag (PS) Skull Base To Thigh  Result Date: 07/21/2021 CLINICAL DATA:  Subsequent treatment strategy for nasopharyngeal/soft palate cancer. EXAM: NUCLEAR MEDICINE PET SKULL BASE TO THIGH TECHNIQUE: 8.1 mCi F-18 FDG was injected intravenously. Full-ring PET imaging was performed from the skull base to thigh after the radiotracer. CT data was obtained and used for attenuation correction and anatomic localization. Fasting blood glucose: 108 mg/dl COMPARISON:  03/18/2021. FINDINGS: Mediastinal blood pool activity: SUV max 2.6 Liver activity: SUV max NA NECK: Minimal residual soft tissue thickening involving the scalp overlying the right posterior fossa, SUV max of 3.0, compared to 6.9 previously. No measurable lesion on CT. No hypermetabolic lymph nodes. As seen previously, there are lymph nodes in the neck with uptake below blood pool. Index right level 2 lymph node measures 11 mm in short axis (4/39), SUV max 2.2. Incidental CT findings: None. CHEST: No hypermetabolic lymph nodes or pulmonary nodules. Incidental CT findings: Atherosclerotic calcification of the aorta and coronary arteries. Heart is at  the upper limits of normal in size. No pericardial or pleural effusion. ABDOMEN/PELVIS: No abnormal hypermetabolism in the liver, adrenal glands, spleen or pancreas. No hypermetabolic lymph nodes. Incidental CT findings: Liver, gallbladder and adrenal glands are unremarkable. Stones in the kidneys. Spleen, pancreas, stomach and bowel are grossly unremarkable. Atherosclerotic calcification of the aorta. SKELETON: No abnormal hypermetabolism. Incidental CT findings: Degenerative changes in the spine and hips. IMPRESSION: 1. Minimal residual hypermetabolism associated with slight soft tissue thickening in the scalp overlying the right posterior fossa. Fat density lesion seen on 03/18/2021 is no longer visualized. 2. Otherwise, no evidence recurrent or metastatic disease. 3. Bilateral renal stones. 4. Aortic atherosclerosis (ICD10-I70.0). Coronary artery calcification. Electronically Signed   By: Lorin Picket M.D.   On: 07/21/2021 13:29    Impression/Plan:    1) Head and Neck Cancer Status: NED - he is elated. His QOL is excellent.  2) Nutritional Status: no issues  3) Risk Factors: The patient has been educated about risk factors including alcohol and tobacco abuse; they  understand that avoidance of excessive  alcohol and any tobacco is important to prevent recurrences as well as other cancers.  He denies any smoking whatsoever   4) Swallowing: continue SLP exercises -he denies any issues  5) Dental: Encouraged to continue regular followup with dentistry, and dental hygiene including fluoride rinses.  Sees Dr Benson Norway soon.  6) Thyroid function: check annually - WNL today Lab Results  Component Value Date   TSH 1.736 07/23/2021    7) I will see him back in 12 months with surveillance imaging - CT neck/chest  On date of service, in total, I spent 30 minutes on this encounter. Patient was seen in person. _____________________________________   Alan Gibson, MD

## 2021-07-31 ENCOUNTER — Ambulatory Visit: Payer: 59 | Attending: Radiation Oncology | Admitting: Rehabilitation

## 2021-07-31 ENCOUNTER — Other Ambulatory Visit: Payer: Self-pay

## 2021-07-31 ENCOUNTER — Encounter: Payer: Self-pay | Admitting: Rehabilitation

## 2021-07-31 DIAGNOSIS — C113 Malignant neoplasm of anterior wall of nasopharynx: Secondary | ICD-10-CM

## 2021-07-31 DIAGNOSIS — I890 Lymphedema, not elsewhere classified: Secondary | ICD-10-CM

## 2021-07-31 DIAGNOSIS — R293 Abnormal posture: Secondary | ICD-10-CM | POA: Diagnosis not present

## 2021-07-31 NOTE — Therapy (Signed)
[facility] @ [address] [place], [place], [address] Phone: [phone]   Fax:  [phone]  Physical Therapy Evaluation  Patient Details  Name: Alan GRAVELY Sr. MRN: [id number] Date of Birth: October 31, 1955 Referring Provider (PT): Reita May Date: 07/31/2021   PT End of Session - 07/31/21 1053     Visit Number 3    Number of Visits 5    Date for PT Re-Evaluation 09/11/21    PT Start Time 0952    PT Stop Time 1032    PT Time Calculation (min) 40 min    Activity Tolerance Patient tolerated treatment well    Behavior During Therapy [facility] for tasks assessed/performed             Past Medical History:  Diagnosis Date   Chest pain    2021   History of kidney stones    Hypertension    Nasopharyngeal cancer ([place])    Pneumonia    Sleep apnea     Past Surgical History:  Procedure Laterality Date   FINE NEEDLE ASPIRATION BIOPSY     HERNIA REPAIR     Umbilicatl hernia   IR GASTROSTOMY TUBE REMOVAL  05/01/2021   IR IMAGING GUIDED PORT INSERTION  10/18/2020   IR REMOVAL TUN ACCESS W/ PORT W/O FL MOD SED  05/01/2021   LAPAROSCOPIC INSERTION GASTROSTOMY TUBE N/A 10/23/2020   Procedure: LAPAROSCOPIC ASSISTED PEG TUBE;  Surgeon: Dwan Bolt, MD;  Location: [facility];  Service: General;  Laterality: N/A;  60   ROTATOR CUFF REPAIR     Torn Labrum      There were no vitals filed for this visit.    Subjective Assessment - 07/31/21 0947     Subjective having swelling under the chin    Pertinent History Nasopharyngeal cancer: completed radiation to his nasopharynx on 12/12/2020.  PET scan recently showing NED or mets.  No feeding tube, has been gaining weight again.    Currently in Pain? No/denies                   LYMPHEDEMA/ONCOLOGY QUESTIONNAIRE - 07/31/21 0001       Type   Cancer Type nasopharyngeal cancer      Treatment   Past Radiation Treatment Yes      Head and Neck   4 cm superior to  sternal notch around neck 40 cm    6 cm superior to sternal notch around neck 39 cm    8 cm superior to sternal notch around neck 40 cm    Other 26.5   chin strap                    Objective measurements completed on examination: See above findings.       [facility] Adult PT Treatment/Exercise - 07/31/21 0001       Manual Therapy   Manual Therapy Edema management;Manual Lymphatic Drainage (MLD)    Edema Management made pt chip pack for use at home and gave print out of [employer] and [employer]ynolds chin strap for consideration    Manual Lymphatic Drainage (MLD) education in front of mirror [place] anterior pathway but shortened to include only: collarbones, breaths, axilla, anterior chest, and then neck steps with excellent performance by pt after demo and vcs                     PT Education - 07/31/21 1052  Education Details compression, self MLD    Person(s) Educated Patient    Methods Explanation;Demonstration;Verbal cues;Tactile cues;Handout    Comprehension Verbalized understanding;Returned demonstration                 PT Long Term Goals - 07/31/21 1057       PT LONG TERM GOAL #1   Title Pt will be ind withself MLD and compression for lymphedema    Time 6    Period Weeks    Status New                    Plan - 07/31/21 1053     Clinical Impression Statement Pt returns to cancer rehab with lymphedema of the submental region post radiation.  Edema is mild overall without pitting.  Pt was educated on self massage and given home compression with handouts on other permanent compression options.  pt will attempt self care x 2 weeks and then return due to preference.    Stability/Clinical Decision Making Stable/Uncomplicated    Clinical Decision Making Low    Rehab Potential Good    PT Frequency Biweekly    PT Duration 6 weeks    PT Treatment/Interventions ADLs/Self Care Home Management;Patient/family education;Therapeutic exercise;Manual  lymph drainage;Manual techniques;Taping    PT Next Visit Plan how was self MLD and compression, redo measures, show pt pump    Consulted and Agree with Plan of Care Patient             Patient will benefit from skilled therapeutic intervention in order to improve the following deficits and impairments:  Postural dysfunction, Decreased knowledge of precautions, Increased edema  Visit Diagnosis: Abnormal posture  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)  Lymphedema, not elsewhere classified     Problem List 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   Chronic periodontitis 05/20/2021   Incipient enamel caries 05/20/2021   Abfraction 05/20/2021   Acquired lymphedema 05/08/2021   Abnormal posture 05/08/2021   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 MRN: [id number] Date of Birth: 07/02/1956

## 2021-07-31 NOTE — Patient Instructions (Signed)
Head and Neck Lymphedema:  Lymphedema (swelling) is very common among patients who have undergone head and neck surgery or radiation therapy. Up to 75% of patients will have some signs and symptoms of lymphedema after treatment.  Chronic lymphedema can lead to worsening inflammation and permanent fibrosis (scarring) of the tissues - leaving them stiff in texture.  The treatment method used for lymphedema is called complete decongestive therapy, or CDT. CDT is a series of techniques including (1) massage known as manual lymph drainage (MLD), (2) compression,  (3) exercises to improve the flow of lymph, and (4) skin care of the affected areas. CDT has been shown to have lasting effects on the severity of lymphedema at all stages and to improve overall quality of life among lymphedema sufferers. Much of CDT may be performed at home by the patient under the guidance of a lymphedema therapist.  60% of patients with head and neck lymphedema can expect have significant improvement after CDT.  The highest rates of success are seen among patients who consistently and properly use CDT at least 5x per week over a 3 month period.  The goal is to figure out how often you need to use your compression and massage to keep your swelling controlled - each patient is different.   To start:   Use your compression at least 1 hour per day up to overnight   Perform self massage at least 1 time per day   Sleep in an elevated position to keep swelling from worsening over night.  Consider a wedge pillow to help.    

## 2021-08-07 ENCOUNTER — Ambulatory Visit: Payer: 59 | Admitting: Hematology and Oncology

## 2021-08-07 ENCOUNTER — Other Ambulatory Visit: Payer: 59

## 2021-08-14 ENCOUNTER — Encounter: Payer: Self-pay | Admitting: Rehabilitation

## 2021-08-20 ENCOUNTER — Encounter (HOSPITAL_COMMUNITY): Payer: 59 | Admitting: Dentistry

## 2021-09-02 ENCOUNTER — Other Ambulatory Visit: Payer: Self-pay

## 2021-09-02 ENCOUNTER — Encounter: Payer: Self-pay | Admitting: Rehabilitation

## 2021-09-02 ENCOUNTER — Ambulatory Visit: Payer: 59 | Attending: Radiation Oncology | Admitting: Rehabilitation

## 2021-09-02 ENCOUNTER — Telehealth (HOSPITAL_COMMUNITY): Payer: Self-pay

## 2021-09-02 DIAGNOSIS — R293 Abnormal posture: Secondary | ICD-10-CM | POA: Insufficient documentation

## 2021-09-02 DIAGNOSIS — C113 Malignant neoplasm of anterior wall of nasopharynx: Secondary | ICD-10-CM | POA: Insufficient documentation

## 2021-09-02 DIAGNOSIS — I890 Lymphedema, not elsewhere classified: Secondary | ICD-10-CM | POA: Insufficient documentation

## 2021-09-02 NOTE — Therapy (Addendum)
[facility] @ [place], [place], [address] Phone: [phone]   Fax:  [phone]  Physical Therapy Treatment  Patient Details  Name: Alan LEWELLEN Sr. 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   Subjective Assessment - 09/02/21 1011     Subjective I think it is getting better and I am doing fine.  The massage is good and I use the compression. Wearing compression at least 4min up to a few hours    Pertinent History Nasopharyngeal cancer: completed radiation to his nasopharynx on 12/12/2020.  PET scan recently showing NED or mets.  No feeding tube, has been gaining weight again.    Currently in Pain? No/denies                   LYMPHEDEMA/ONCOLOGY QUESTIONNAIRE - 09/02/21 0001       Head and Neck   6  cm superior to sternal notch around neck 36 cm   estimated 6cm at widest point of neck   Other 26                        [facility] Adult PT Treatment/Exercise - 09/02/21 0001       Manual Therapy   Edema Management made pt a second chip pack for use at home    Manual Lymphatic Drainage (MLD) reviewed all steps seated in front of mirror norton anterior pathway but shortened to include only: collarbones, breaths, axilla, anterior chest, and then neck steps with excellent performance by pt with each step read out loud                          PT Long Term Goals - 07/31/21 1057       PT LONG TERM GOAL #1   Title Pt will be ind withself MLD and compression for lymphedema    Time 6    Period Weeks    Status New  Pt continues with chronic lymphedema after at least 4 weeks of compression, elevation, and exercises provided with PT treatment and self management.   Pt demonstrates hyperplasia with increased size and is at risk for hyperkeratosis and fibrosis with untreated lymphedema.    Pt is able to do self MLD but would benefit from flexitouch advanced system due to this being the only head and neck pump on the market.       Plan - 09/02/21 1140     Clinical Impression Statement Pt has made significant reductions in the neck with self care and use of MLD and compression.  Pt was shown the flexitouch pump today and demo was sent in for insurance check although pt is doing very well with self care.  Pt still has anterior neck edema but less severe.    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)  Lymphedema, not elsewhere classified     Problem List 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   Chronic periodontitis 05/20/2021   Incipient enamel caries 05/20/2021   Abfraction 05/20/2021   Acquired lymphedema 05/08/2021   Abnormal posture 05/08/2021   Leukopenia due to antineoplastic chemotherapy ([place]) 11/27/2020   Chemotherapy induced nausea and vomiting 11/06/2020   Weight loss, unintentional 11/06/2020   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 MRN: [id number] Date of Birth: 09/04/1955

## 2021-09-03 ENCOUNTER — Encounter (HOSPITAL_COMMUNITY): Payer: 59 | Admitting: Dentistry

## 2021-09-23 ENCOUNTER — Other Ambulatory Visit (HOSPITAL_COMMUNITY): Payer: Self-pay

## 2021-09-24 ENCOUNTER — Encounter (HOSPITAL_COMMUNITY): Payer: 59 | Admitting: Dentistry

## 2021-09-30 ENCOUNTER — Ambulatory Visit: Payer: 59 | Admitting: Rehabilitation

## 2021-10-09 ENCOUNTER — Encounter: Payer: Self-pay | Admitting: Rehabilitation

## 2021-10-09 ENCOUNTER — Ambulatory Visit: Payer: 59 | Attending: Radiation Oncology | Admitting: Rehabilitation

## 2021-10-09 DIAGNOSIS — I890 Lymphedema, not elsewhere classified: Secondary | ICD-10-CM | POA: Insufficient documentation

## 2021-10-09 DIAGNOSIS — C113 Malignant neoplasm of anterior wall of nasopharynx: Secondary | ICD-10-CM | POA: Diagnosis present

## 2021-10-09 DIAGNOSIS — R293 Abnormal posture: Secondary | ICD-10-CM | POA: Insufficient documentation

## 2021-10-09 NOTE — Therapy (Signed)
[facility] ?[facility] @ [employer] ?[place], [place], [address] ?Phone: [phone]   Fax:  [phone] ? ?Physical Therapy Treatment ? ?Patient Details  ?Name: Alan BRANDIS Sr. ?MRN: [id number] ?Date of Birth: 01/01/1956 ?Referring Provider (PT): Isidore Moos ? ? ?Encounter Date: 10/09/2021 ? ? PT End of Session - 10/09/21 0813   ? ? Visit Number 5   ? Number of Visits 5   ? PT Start Time 0815   ? PT Stop Time 0845   ? PT Time Calculation (min) 30 min   ? Activity Tolerance Patient tolerated treatment well   ? Behavior During Therapy [facility] for tasks assessed/performed   ? ?  ?  ? ?  ? ? ?Past Medical History:  ?Diagnosis Date  ? Chest pain   ? 2021  ? History of kidney stones   ? Hypertension   ? 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 Pneumonia   ? Sleep apnea   ? ? ?Past Surgical History:  ?Procedure Laterality Date  ? FINE NEEDLE ASPIRATION BIOPSY    ? HERNIA REPAIR    ? Umbilicatl hernia  ? IR GASTROSTOMY TUBE REMOVAL  05/01/2021  ? IR IMAGING GUIDED PORT INSERTION  10/18/2020  ? IR REMOVAL TUN ACCESS W/ PORT W/O FL MOD SED  05/01/2021  ? LAPAROSCOPIC INSERTION GASTROSTOMY TUBE N/A 10/23/2020  ? Procedure: LAPAROSCOPIC ASSISTED PEG TUBE;  Surgeon: Dwan Bolt, MD;  Location: [facility];  Service: General;  Laterality: N/A;  76  ? [place]    ? Torn Labrum    ? ? ?There were no vitals filed for this visit. ? ? Subjective Assessment - 10/09/21 0814   ? ? Subjective The pump people came and did a demo but I haven't heard any updates   ? Pertinent History Nasopharyngeal cancer: completed radiation to his nasopharynx on 12/12/2020.  PET scan recently showing NED or mets.  No feeding tube, has been gaining weight again.   ? Currently in Pain? No/denies   ? ?  ?  ? ?  ? ? ? ? ? ? ? ? LYMPHEDEMA/ONCOLOGY QUESTIONNAIRE - 10/09/21 0001   ? ?  ? Type  ? Cancer Type nasopharyngeal cancer   ?  ? Head and Neck  ? 6 cm superior to sternal notch around neck 36 cm   ? Other 25   ? ?  ?  ? ?   ? ? ? ? ? ? ? ? ? ? ? ? ? [facility] Adult PT Treatment/Exercise - 10/09/21 0001   ? ?  ? Manual Therapy  ? Edema Management emailed flexi regarding pump status   ? Manual Lymphatic Drainage (MLD) PT performed in supine elevated HOB: collarbones, 5 breaths, bil axilla, anterior chest, and then neck steps with review of self MLD steps throughout   ? ?  ?  ? ?  ? ? ? ? ? ? ? ? ? ? ? ? ? ? ? PT Long Term Goals - 07/31/21 1057   ? ?  ? PT LONG TERM GOAL #1  ? Title Pt will be ind withself MLD and compression for lymphedema   ? Time 6   ? Period Weeks   ? Status New   ? ?  ?  ? ?  ? ? ? ? ? ? ? ? Plan - 10/09/21 0852   ? ? Clinical Impression Statement Pt is doing really well.  Waiting on flexitouch at this point  but the neck has mild edema only with no fibrosis.   ? PT Next Visit Plan DC most likely   ? ?  ?  ? ?  ? ? ?Patient will benefit from skilled therapeutic intervention in order to improve the following deficits and impairments:    ? ?Visit Diagnosis: ?Abnormal posture ? ?Lymphedema, not elsewhere classified ? 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 ? ? ? ?Problem List ?Patient Active Problem List  ? Diagnosis Date Noted  ? History of radiation to head and neck region 05/20/2021  ? Encounter for dental examination and cleaning without abnormal findings 05/20/2021  ? 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  ? Dysphagia 05/20/2021  ? Dysgeusia 05/20/2021  ? Teeth missing 05/20/2021  ? Accretions on teeth 05/20/2021  ? Dental caries 05/20/2021  ? Excessive attrition of teeth 05/20/2021  ? 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 Chronic periodontitis 05/20/2021  ? Incipient enamel caries 05/20/2021  ? Abfraction 05/20/2021  ? Acquired lymphedema 05/08/2021  ? Abnormal posture 05/08/2021  ? Leukopenia due to antineoplastic chemotherapy ([place]) 11/27/2020  ? Chemotherapy induced nausea and vomiting 11/06/2020  ? Weight loss, unintentional 11/06/2020  ? 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  Essential hypertension 11/14/2019  ? Hyperlipidemia 11/14/2019  ? Family history of heart disease 11/14/2019  ? Chest pain of uncertain etiology 03/50/0938  ? Nonspecific abnormal electrocardiogram (ECG) (EKG) 11/14/2019  ? ? ?Stark Bray, PT ?10/09/2021, 8:53 AM ? ?[facility] ?[facility] @ [place] ?[place], [place], [address] ?Phone: [phone]   Fax:  [phone] ? ?Name: Alan EGUIA Sr. ?MRN: [id number] ?Date of Birth: 07/25/1955 ? ? ? ?

## 2021-10-20 ENCOUNTER — Other Ambulatory Visit: Payer: Self-pay | Admitting: *Deleted

## 2021-10-20 DIAGNOSIS — C113 Malignant neoplasm of anterior wall of nasopharynx: Secondary | ICD-10-CM

## 2021-10-22 ENCOUNTER — Inpatient Hospital Stay: Payer: 59 | Admitting: Hematology and Oncology

## 2021-10-22 ENCOUNTER — Inpatient Hospital Stay: Payer: 59 | Attending: Hematology and Oncology

## 2021-10-22 ENCOUNTER — Encounter: Payer: Self-pay | Admitting: Hematology and Oncology

## 2021-10-22 ENCOUNTER — Other Ambulatory Visit: Payer: Self-pay

## 2021-10-22 DIAGNOSIS — I890 Lymphedema, not elsewhere classified: Secondary | ICD-10-CM

## 2021-10-22 DIAGNOSIS — D701 Agranulocytosis secondary to cancer chemotherapy: Secondary | ICD-10-CM | POA: Diagnosis not present

## 2021-10-22 DIAGNOSIS — R634 Abnormal weight loss: Secondary | ICD-10-CM | POA: Diagnosis not present

## 2021-10-22 DIAGNOSIS — Z9221 Personal history of antineoplastic chemotherapy: Secondary | ICD-10-CM | POA: Diagnosis not present

## 2021-10-22 DIAGNOSIS — T451X5A Adverse effect of antineoplastic and immunosuppressive drugs, initial encounter: Secondary | ICD-10-CM

## 2021-10-22 DIAGNOSIS — T451X5D Adverse effect of antineoplastic and immunosuppressive drugs, subsequent encounter: Secondary | ICD-10-CM | POA: Diagnosis not present

## 2021-10-22 DIAGNOSIS — C113 Malignant neoplasm of anterior wall of nasopharynx: Secondary | ICD-10-CM

## 2021-10-22 DIAGNOSIS — Z923 Personal history of irradiation: Secondary | ICD-10-CM | POA: Diagnosis not present

## 2021-10-22 DIAGNOSIS — R682 Dry mouth, unspecified: Secondary | ICD-10-CM | POA: Diagnosis not present

## 2021-10-22 DIAGNOSIS — Z79899 Other long term (current) drug therapy: Secondary | ICD-10-CM | POA: Insufficient documentation

## 2021-10-22 DIAGNOSIS — I10 Essential (primary) hypertension: Secondary | ICD-10-CM | POA: Diagnosis not present

## 2021-10-22 LAB — CMP (CANCER CENTER ONLY)
ALT: 14 U/L (ref 0–44)
AST: 16 U/L (ref 15–41)
Albumin: 4.6 g/dL (ref 3.5–5.0)
Alkaline Phosphatase: 66 U/L (ref 38–126)
Anion gap: 8 (ref 5–15)
BUN: 20 mg/dL (ref 8–23)
CO2: 30 mmol/L (ref 22–32)
Calcium: 9.8 mg/dL (ref 8.9–10.3)
Chloride: 101 mmol/L (ref 98–111)
Creatinine: 1.3 mg/dL — ABNORMAL HIGH (ref 0.61–1.24)
GFR, Estimated: >60 mL/min (ref >60)
Glucose, Bld: 131 mg/dL — ABNORMAL HIGH (ref 70–99)
Potassium: 3.9 mmol/L (ref 3.5–5.1)
Sodium: 139 mmol/L (ref 135–145)
Total Bilirubin: 0.6 mg/dL (ref 0.3–1.2)
Total Protein: 7.8 g/dL (ref 6.5–8.1)

## 2021-10-22 LAB — CBC WITH DIFFERENTIAL (CANCER CENTER ONLY)
Abs Immature Granulocytes: 0.01 10*3/uL (ref 0.00–0.07)
Basophils Absolute: 0 10*3/uL (ref 0.0–0.1)
Basophils Relative: 0 %
Eosinophils Absolute: 0 10*3/uL (ref 0.0–0.5)
Eosinophils Relative: 1 %
HCT: 42.5 % (ref 39.0–52.0)
Hemoglobin: 14.3 g/dL (ref 13.0–17.0)
Immature Granulocytes: 0 %
Lymphocytes Relative: 18 %
Lymphs Abs: 0.6 10*3/uL — ABNORMAL LOW (ref 0.7–4.0)
MCH: 31.2 pg (ref 26.0–34.0)
MCHC: 33.6 g/dL (ref 30.0–36.0)
MCV: 92.6 fL (ref 80.0–100.0)
Monocytes Absolute: 0.3 10*3/uL (ref 0.1–1.0)
Monocytes Relative: 10 %
Neutro Abs: 2.5 10*3/uL (ref 1.7–7.7)
Neutrophils Relative %: 71 %
Platelet Count: 191 10*3/uL (ref 150–400)
RBC: 4.59 MIL/uL (ref 4.22–5.81)
RDW: 14.2 % (ref 11.5–15.5)
WBC Count: 3.5 10*3/uL — ABNORMAL LOW (ref 4.0–10.5)
nRBC: 0 % (ref 0.0–0.2)

## 2021-10-22 NOTE — Assessment & Plan Note (Signed)
Weight loss has stabilized. No complaints today except dry mouth ?Encouraged balanced meals. ?

## 2021-10-22 NOTE — Progress Notes (Signed)
[facility] ?FOLLOW-UP progress notes ? ?Patient Care Team: ?Nolene Ebbs, MD as PCP - General (Internal Medicine) ?Nolene Ebbs, MD (Internal Medicine) ?Mal[place]elt, Stephani Police, RN as Oncology Nurse Navigator ?Eppie Gibson, MD as Consulting Physician (Radiation Oncology) ?[name], MD as Consulting Physician (Otolaryngology) ?Benay Pike, MD as Consulting Physician (Hematology and Oncology) ? ?CHIEF COMPLAINTS/PURPOSE OF VISIT:  ?Nasopharyngeal cancer, status post chemoradiation therapy ? ?I reviewed the patient's records extensive and collaborated the history with the patient. 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HCC) ? ?  ?08/06/2020 Initial Diagnosis  ? Roselee Culver presented with six-month history of gradually enlarging bilateral lymph nodes with associated mild discomfort and pressure. ?  ?08/07/2020 Imaging  ? He had CT soft tissue neck done on August 07, 2020 which showed nasopharyngeal soft tissue prominence, greatest soft tissue effacement of adjacent right parapharyngeal fat suspected to be at least enlarged right retropharyngeal lymph node.  Bulky bilateral cervical midline likely right intraparotid lymphadenopathy ?  ?08/30/2020 Pathology Results  ? Biopsy of right neck lymph node on 08/30/2020 revealed: squamous cell carcinoma, p16 positive. EBV ordered, negative. ?  ?09/27/2020 PET scan  ? 1. There is intense FDG uptake within the area of increased soft tissue fullness in the posterior nasopharynx. Cannot exclude primary nasopharyngeal neoplasm. ?2. Extensive, bulky bilateral FDG avid cervical adenopathy. Large FDG avid lymph node is also identified within the right parotid gland. Imaging findings compatible with metastatic adenopathy. ?3. Subcentimeter right supraclavicular lymph node exhibits mild FDG uptake above background activity.  Equivocal for nodal metastasis. No additional signs of thoracic, abdominal, or pelvic metastasis. No evidence for osseous metastatic disease.  ?  ?10/04/2020 Cancer Staging  ? Staging form: Pharynx - Nasopharynx, AJCC 8th Edition ?- Clinical stage from 10/04/2020: Stage IVA (cT1, cN3, cM0) - Signed by Eppie Gibson, MD on 10/04/2020 ?Stage prefix: Initial diagnosis ? ?  ?10/11/2020 - 12/13/2020 Chemotherapy  ? The patient received weekly cisplatin with radiation ?  ?10/24/2020 - 12/12/2020 Radiation Therapy  ? Radiation Treatment Dates: 10/24/2020 through 12/12/2020 ?Site Technique Total Dose (Gy) Dose per Fx (Gy) Completed Fx Beam Energies  ?Neck: HN_NasoP IMRT 70/70 2 35/35 6X  ?  ?03/18/2021 PET scan  ? Marked interval response to therapy. Near complete resolution of nodal enlargement seen on the previous study and no substantial FDG uptake in the anterior neck. ?   ?New area of hypermetabolic activity corresponds to an area in the RIGHT occipital region which shows predominantly fatty density. Given the FDG uptake associated with this location would suggest focused ultrasound with biopsy as warranted for further evaluation. Given the intense nature of the uptake, metastatic disease in this area is strongly considered, CT imaging findings however are atypical for disease. ?  ?Small amount of gas within the central parotid gland could even be within the parotid duct. Correlate with any symptoms on this side. No surrounding stranding. ?  ?Aortic Atherosclerosis (ICD10-I70.0). ?  ?  ?05/01/2021 Procedure  ? Successful 20 French gastrostomy removal ?  ? ?Interval History ? ?HISTORY OF PRESENTING ILLNESS:  ? ?Idelle Leech Sr. 66 y.o. male is here for a follow up. ?He feels well, he is able to eat and swallow well, weight has stabilized around 175 lbs. ?He is dealing with dry mouth.  ?No change in breathing. ?No change in bowel habits ?Urinating well. ? ?Rest of the  pertinent 10 point ROS reviewed and neg. ? ?MEDICAL HISTORY:  ?Past  Medical History:  ?Diagnosis Date  ? Chest pain   ? 2021  ? History of kidney stones   ? Hypertension   ? 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 Pneumonia   ? Sleep apnea   ? ? ?SURGICAL HISTORY: ?Past Surgical History:  ?Procedure Laterality Date  ? FINE NEEDLE ASPIRATION BIOPSY    ? HERNIA REPAIR    ? Umbilicatl hernia  ? IR GASTROSTOMY TUBE REMOVAL  05/01/2021  ? IR IMAGING GUIDED PORT INSERTION  10/18/2020  ? IR REMOVAL TUN ACCESS W/ PORT W/O FL MOD SED  05/01/2021  ? LAPAROSCOPIC INSERTION GASTROSTOMY TUBE N/A 10/23/2020  ? Procedure: LAPAROSCOPIC ASSISTED PEG TUBE;  Surgeon: Dwan Bolt, MD;  Location: [facility];  Service: General;  Laterality: N/A;  29  ? [place]    ? Torn Labrum    ? ? ?SOCIAL HISTORY: ?Social History  ? ?Socioeconomic History  ? Marital status: Married  ?  Spouse name: Not on file  ? Number of children: Not on file  ? Years of education: Not on file  ? Highest education level: Not on file  ?Occupational History  ? Not on file  ?Tobacco Use  ? Smoking status: Never  ? Smokeless tobacco: Never  ?Vaping Use  ? Vaping Use: Never used  ?Substance and Sexual Activity  ? Alcohol use: Not Currently  ?  Comment: very rarely  ? Drug use: No  ? Sexual activity: Not Currently  ?Other Topics Concern  ? Not on file  ?Social History Narrative  ? Not on file  ? ?Social Determinants of Health  ? ?Financial Resource Strain: Not on file  ?Food Insecurity: Not on file  ?Transportation Needs: Not on file  ?Physical Activity: Not on file  ?Stress: Not on file  ?Social Connections: Not on file  ?Intimate Partner Violence: Not on file  ? ? ?FAMILY HISTORY: ?No family history on file. ? ?ALLERGIES:  has No Known Allergies. ? ?MEDICATIONS:  ?Current Outpatient Medications  ?Medication Sig Dispense Refill  ? amLODipine (NORVASC) 10 MG tablet Take 10 mg by mouth daily.    ? atorvastatin (LIPITOR) 80 MG tablet Take 80 mg by mouth daily.    ? cetirizine (ZYRTEC) 10 MG tablet Take 10 mg by mouth daily.    ?  ergocalciferol (VITAMIN D2) 1.25 MG (50000 UT) capsule Take 50,000 Units by mouth once a week. Wednesday    ? nitroGLYCERIN (NITROSTAT) 0.4 MG SL tablet Place 0.4 mg under the tongue every 5 (five) minutes x 3 doses as needed for chest pain.    ? omeprazole (PRILOSEC) 20 MG capsule Take 20 mg by mouth daily.    ? sildenafil (VIAGRA) 50 MG tablet Take 50-100 mg by mouth daily as needed for erectile dysfunction.    ? sodium fluoride (SODIUM FLUORIDE 5000 PPM) 1.1 % GEL dental gel Take 1 application by mouth at bedtime. Place 1 drop into each tooth space and leave in mouth for 5 minutes at bedtime.  Do not rinse with water, eat or drink for at least 30 minutes after use. 120 mL 11  ? ?No current facility-administered medications for this visit.  ? ? ?REVIEW OF SYSTEMS:   ?Constitutional: Denies fevers, chills or abnormal night sweats ?Eyes: Denies blurriness of vision, double vision or watery eyes ?Ears, nose, mouth, throat, and face: Denies mucositis or sore throat ?Respiratory: Denies cough, dyspnea or wheezes ?Cardiovascular: Denies  palpitation, chest discomfort or lower extremity swelling ?Gastrointestinal:  Denies nausea, heartburn or change in bowel habits ?Skin: Denies abnormal skin rashes ?Lymphatics: Denies new lymphadenopathy or easy bruising ?Neurological:Denies numbness, tingling or new weaknesses ?Behavioral/Psych: Mood is stable, no new changes  ?All other systems were reviewed with the patient and are negative. ? ?PHYSICAL EXAMINATION: ?ECOG PERFORMANCE STATUS: 1 - Symptomatic but completely ambulatory ? ?Vitals:  ? 10/22/21 0927  ?BP: (!) 150/88  ?Pulse: 85  ?Resp: 16  ?Temp: (!) 97.5 ?F (36.4 ?C)  ?SpO2: 100%  ? ? ?Filed Weights  ? 10/22/21 0927  ?Weight: 174 lb 3.2 oz (79 kg)  ? ? ? ?GENERAL:alert, no distress and comfortable ?SKIN: skin color, texture, turgor are normal, no rashes or significant lesions ?EYES: normal, conjunctiva are pink and non-injected, sclera clear ?OROPHARYNX:no exudate, normal  lips, buccal mucosa, and tongue.  Noted dry oropharynx ?NECK: Lymphedema is improving.  No palpable lymphadenopathy.   ?LYMPH:  no palpable lymphadenopathy in the cervical, axillary or inguinal ?LUNGS: clear to a

## 2021-10-22 NOTE — Assessment & Plan Note (Signed)
Improved. Will continue to monitor. ?Completed lymphedema PT ?

## 2021-10-22 NOTE — Assessment & Plan Note (Addendum)
Patient completed CRT with weekly cisplatin, refused adjuvant chemotherapy ?PET EOT with complete response. ?He is doing well with no clinical evidence of recurrence. ?He will see ENT in June, yet to make an appt. ?FU with [place] in 6 months or sooner as needed ?

## 2021-10-22 NOTE — Assessment & Plan Note (Signed)
WBC improved. ANC normal ?No treatment needed. ?

## 2021-11-05 ENCOUNTER — Encounter (HOSPITAL_COMMUNITY): Payer: 59 | Admitting: Dentistry

## 2021-12-17 ENCOUNTER — Encounter (HOSPITAL_COMMUNITY): Payer: Self-pay | Admitting: Dentistry

## 2021-12-17 ENCOUNTER — Ambulatory Visit (INDEPENDENT_AMBULATORY_CARE_PROVIDER_SITE_OTHER): Payer: Dental | Admitting: Dentistry

## 2021-12-17 VITALS — BP 141/76 | HR 62 | Temp 98.4°F

## 2021-12-17 DIAGNOSIS — K031 Abrasion of teeth: Secondary | ICD-10-CM

## 2021-12-17 DIAGNOSIS — K036 Deposits [accretions] on teeth: Secondary | ICD-10-CM

## 2021-12-17 DIAGNOSIS — Z0120 Encounter for dental examination and cleaning without abnormal findings: Secondary | ICD-10-CM | POA: Diagnosis not present

## 2021-12-17 DIAGNOSIS — K032 Erosion of teeth: Secondary | ICD-10-CM

## 2021-12-17 DIAGNOSIS — K117 Disturbances of salivary secretion: Secondary | ICD-10-CM

## 2021-12-17 DIAGNOSIS — Z923 Personal history of irradiation: Secondary | ICD-10-CM

## 2021-12-17 DIAGNOSIS — M270 Developmental disorders of jaws: Secondary | ICD-10-CM

## 2021-12-17 DIAGNOSIS — K029 Dental caries, unspecified: Secondary | ICD-10-CM | POA: Diagnosis not present

## 2021-12-17 DIAGNOSIS — K06010 Localized gingival recession, unspecified: Secondary | ICD-10-CM

## 2021-12-17 DIAGNOSIS — Y842 Radiological procedure and radiotherapy as the cause of abnormal reaction of the patient, or of later complication, without mention of misadventure at the time of the procedure: Secondary | ICD-10-CM

## 2021-12-17 DIAGNOSIS — K0850 Unsatisfactory restoration of tooth, unspecified: Secondary | ICD-10-CM

## 2021-12-17 DIAGNOSIS — K0530 Chronic periodontitis, unspecified: Secondary | ICD-10-CM

## 2021-12-17 DIAGNOSIS — K030 Excessive attrition of teeth: Secondary | ICD-10-CM

## 2021-12-17 NOTE — Progress Notes (Signed)
Department of Dental Medicine   Service Date:   12/17/2021  Patient Name:  Alan NDIAYE Sr. Date of Birth:   03/13/1956 Medical Record Number: [id number]  TODAY'S VISIT: RECALL   PROCEDURES: Adult prophylaxis, updated radiographs (BW's), periodic exam ASSESSMENT: Soft tissue:  Dry mucous membranes Caries risk:  HIGH Periodontal impression:  Accretions on teeth, localized inflamed & erythematous gingival tissue Other findings:  New caries+incipient lesions PLAN/RECOMMENDATIONS: Continue w/ 6 mo recall interval (including cleanings & updated radiographs) Deliver 2nd set of fluoride trays at next visit  Next visit:  Restorative per treatment plan & deliver fluoride trays   12/17/2021   HISTORY OF PRESENT ILLNESS: Alan Leech Sr. is a very pleasant 66 y.o. male who presents today for a periodic dental exam, updated radiographs (BW's) and cleaning. Medical and dental history reviewed with the patient.  No reported changes.  He does report that he left his upper and lower fluoride trays in [place] the last time he went there so he has not been using them.  His side effects following radiation therapy have continued to improve.  His dry mouth has stayed about the same.  He currently denies any dental/orofacial pain or sensitivity.  Patient is able to manage oral secretions.  Patient denies dysphagia, odynophagia, dysphonia, SOB and neck pain.  Patient denies fever, rigors and malaise.   CHIEF COMPLAINT: Here for a 6 month recall visit; patient with no complaints.   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   Chronic periodontitis 05/20/2021   Incipient enamel caries 05/20/2021   Abfraction 05/20/2021   Acquired lymphedema 05/08/2021   Abnormal posture 05/08/2021   Leukopenia due to antineoplastic chemotherapy ([place]) 11/27/2020   Chemotherapy induced nausea and vomiting 11/06/2020   Weight loss, unintentional 11/06/2020   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   Essential hypertension 11/14/2019   Hyperlipidemia 11/14/2019   Family history of heart disease 11/14/2019   Chest pain of uncertain etiology 85/27/7824   Nonspecific abnormal electrocardiogram (ECG) (EKG) 11/14/2019   Past Medical History:  Diagnosis Date   Chest pain    2021   History of kidney stones    Hypertension    Nasopharyngeal cancer ([place])    Pneumonia    Sleep apnea    Past Surgical History:  Procedure Laterality Date   FINE NEEDLE ASPIRATION BIOPSY     HERNIA REPAIR     Umbilicatl hernia   IR GASTROSTOMY TUBE REMOVAL  05/01/2021   IR IMAGING GUIDED PORT INSERTION  10/18/2020   IR REMOVAL TUN ACCESS W/ PORT W/O FL MOD SED  05/01/2021   LAPAROSCOPIC INSERTION GASTROSTOMY TUBE N/A 10/23/2020   Procedure: LAPAROSCOPIC ASSISTED PEG TUBE;  Surgeon: Dwan Bolt, MD;  Location: [facility];  Service: General;  Laterality: N/A;  60   ROTATOR CUFF REPAIR     Torn Labrum     No Known Allergies Current Outpatient Medications  Medication Sig Dispense Refill   amLODipine (NORVASC) 10 MG tablet Take 10 mg by mouth daily.     atorvastatin (LIPITOR) 80 MG tablet Take 80  mg by mouth daily.     cetirizine (ZYRTEC) 10 MG tablet Take 10 mg by mouth daily.     ergocalciferol (VITAMIN D2) 1.25 MG (50000 UT) capsule Take 50,000 Units by mouth once a week. Wednesday     nitroGLYCERIN (NITROSTAT) 0.4 MG SL tablet Place 0.4 mg under the tongue every 5 (five) minutes x 3 doses as needed for chest pain.     omeprazole (PRILOSEC) 20 MG capsule Take 20 mg by mouth daily.     sildenafil  (VIAGRA) 50 MG tablet Take 50-100 mg by mouth daily as needed for erectile dysfunction.     sodium fluoride (SODIUM FLUORIDE 5000 PPM) 1.1 % GEL dental gel Take 1 application by mouth at bedtime. Place 1 drop into each tooth space and leave in mouth for 5 minutes at bedtime.  Do not rinse with water, eat or drink for at least 30 minutes after use. 120 mL 11   No current facility-administered medications for this visit.    LABS: Lab Results  Component Value Date   WBC 3.5 (L) 10/22/2021   HGB 14.3 10/22/2021   HCT 42.5 10/22/2021   MCV 92.6 10/22/2021   PLT 191 10/22/2021      Component Value Date/Time   NA 139 10/22/2021 0915   K 3.9 10/22/2021 0915   CL 101 10/22/2021 0915   CO2 30 10/22/2021 0915   GLUCOSE 131 (H) 10/22/2021 0915   BUN 20 10/22/2021 0915   CREATININE 1.30 (H) 10/22/2021 0915   CALCIUM 9.8 10/22/2021 0915   GFRNONAA >60 10/22/2021 0915   GFRAA 34 (L) 11/20/2015 1436   No results found for: "INR", "PROTIME" No results found for: "PTT"  Social History   Socioeconomic History   Marital status: Married    Spouse name: Not on file   Number of children: Not on file   Years of education: Not on file   Highest education level: Not on file  Occupational History   Not on file  Tobacco Use   Smoking status: Never   Smokeless tobacco: Never  Vaping Use   Vaping Use: Never used  Substance and Sexual Activity   Alcohol use: Not Currently    Comment: very rarely   Drug use: No   Sexual activity: Not Currently  Other Topics Concern   Not on file  Social History Narrative   Not on file   Social Determinants of Health   Financial Resource Strain: Not on file  Food Insecurity: No Food Insecurity (10/15/2020)   Hunger Vital Sign    Worried About Running Out of Food in the Last Year: Never true    Ran Out of Food in the Last Year: Never true  Transportation Needs: No Transportation Needs (10/15/2020)   PRAPARE - Hydrologist  (Medical): No    Lack of Transportation (Non-Medical): No  Physical Activity: Not on file  Stress: No Stress Concern Present (10/15/2020)   [place]    Feeling of Stress : Not at all  Social Connections: [place] (10/15/2020)   Social Connection and Isolation Panel [NHANES]    Frequency of Communication with Friends and Family: More than three times a week    Frequency of Social Gatherings with Friends and Family: Twice a week    Attends Religious Services: More than 4 times per year    Active Member of [employer] or Organizations: Yes    Attends Archivist  Meetings: 1 to 4 times per year    Marital Status: Married  Human resources officer Violence: Not on file   No family history on file.   REVIEW OF SYSTEMS:  Reviewed with the patient as per HPI. Psych:  Patient denies having dental phobia.   VITAL SIGNS: BP (!) 141/76 (BP Location: Right Arm, Patient Position: Sitting, Cuff Size: Normal)   Pulse 62   Temp 98.4 F (36.9 C) (Oral)    PHYSICAL EXAM:  Soft tissue exam completed and charted.  General:  Well-developed, comfortable and in no apparent distress. Neurological:  Alert and oriented to person, place and  time. Extraoral:  Head size, head shape, facial symmetry, skin, complexion, pigmentation, conjunctiva, oral labia, parotid gland, submandibular gland, thyroid, cervical and preauricular lymph nodes, submandibular and submental lymph nodes, tonsillar and occipital lymph nodes and supraclavicular lymph nodes WNL. No swelling or lymphadenopathy.  TMJ asymptomatic without clicks or crepitations. Intraoral:  Soft tissues appear well-perfused and mucous membranes moist.  FOM and vestibules soft and not raised. Oral cavity without mass or lesion. No signs of infection, parulis, sinus tract, edema or erythema evident upon exam.  Bilateral mandibular tori. (+) Mucous membranes:  Dry; mild thick and viscous  saliva. He reports drinking multiple sips of water each day which alleviates this side effect of radiation the best.      HARD TISSUE:  Caries:  Questionable prognosis:  #1O, #17O, #32O Restorable:  #3DO, #5B(V),  #12DO, #13DO, #14DO, #15O, #18OBB(V), #19DOB(V), #21DO, #23I, #25I, #26I, #67MOD, #30DOB(V)  Incipient lesions:  #4D, #368F(V), #68F(V), #84F(V), #20OD, #28D Defective Restorations:  #2 existing MO amalgam open margin (evident on radiograph) Occlusion:   Non-functional teeth:  #2, #17   Supra-erupted teeth:  #2   Other: drifting- #32 towards mesial Other findings:   (+) Attrition/wear: #7, #9, #10 incisal; #23 - #26 incisal (+) Abfraction: #5B(V), #29B(V)      PERIODONTAL:  Plaque:  Slight, localized to posterior teeth Calculus:  Generalized accumulation Periodontally compromised teeth:  #1, #2 Gingival appearance:  Pink, healthy gingival tissue with blunted papilla with areas of inflamed, erythematous gingival tissue.  Exam performed by [facility] B. Benson Norway, D.M.D. with Leta Speller, DAII acting as scribe.   RADIOGRAPHIC EXAM:  Updated radiographs included 4 BW's that were exposed and interpreted.   Generalized mild horizontal bone loss consistent with mild periodontitis. Radiographic calculus evident.  Missing teeth#'s 17 & 31. Existing restorations; #67MO restoration w/ evident open margin & potential recurrent decay.  Caries- #3D, #12D, #13D, #14D, #19D, #21D, #67M&D, #30D.  Incipient lesions- #4D, #20D, #28D.  Pulp stones evident in pulpal chambers of molars.   ASSESSMENT:  1. Periodic dental exam and cleaning 2. History of head and neck cancer 3. History of chemoradiation therapy 4.  Xerostomia due to radiotherapy 5.  Accretions on teeth 6.  Caries 7.  Incipient lesions 8.  Chronic periodontitis 9.  Attrition/wear 10.  Abfraction/flexure 11.  Mandibular tori 12.  Gingival recession 13.  Defective  restoration   PROCEDURES: Adult prophylaxis. Removed all supra- and subgingival calculus and plaque using Cavitron and hand instruments. Polished all teeth.  Flossed. Oral hygiene instruction discussed with the patient.  Discussed the importance of brushing/flossing daily as well as using fluoride treatments as instructed to prevent worsening of radiation caries. Toothbrush, toothpaste and floss given to the patient.   PLAN AND RECOMMENDATIONS: I explained all significant findings of the dental exam with the patient including several new cavities forming on teeth near the gumline  and incisal edges which is commonly seen following radiation therapy in head and neck cancer patients due to side effects (especially dry mouth).  Discussed the importance of using fluoride trays; he says that he is not sure when he will be going back to [place] to get them back.  Offered to make him another set of trays using his old models so that he can start using them again as soon as possible.  The patient verbalized understanding of all findings, discussion, and recommendations.  He elected to go ahead and have another set of fluoride trays made and delivered at his next visit.  Continue with 6 month recall interval (update radiographs + prophy) Call if questions or concerns arise before next visit.   Next visit:  Restorative per treatment plan  All questions and concerns were invited and addressed.  The patient tolerated today's visit well and departed in stable condition.  Charlaine Dalton, D.M.D.

## 2021-12-22 DIAGNOSIS — K06010 Localized gingival recession, unspecified: Secondary | ICD-10-CM | POA: Insufficient documentation

## 2021-12-22 DIAGNOSIS — K0850 Unsatisfactory restoration of tooth, unspecified: Secondary | ICD-10-CM | POA: Insufficient documentation

## 2021-12-24 ENCOUNTER — Ambulatory Visit (INDEPENDENT_AMBULATORY_CARE_PROVIDER_SITE_OTHER): Payer: Dental | Admitting: Dentistry

## 2021-12-24 ENCOUNTER — Encounter (HOSPITAL_COMMUNITY): Payer: Self-pay | Admitting: Dentistry

## 2021-12-24 VITALS — BP 140/82 | HR 60 | Temp 99.5°F

## 2021-12-24 DIAGNOSIS — Z923 Personal history of irradiation: Secondary | ICD-10-CM | POA: Diagnosis not present

## 2021-12-24 DIAGNOSIS — K0850 Unsatisfactory restoration of tooth, unspecified: Secondary | ICD-10-CM | POA: Diagnosis not present

## 2021-12-24 DIAGNOSIS — K029 Dental caries, unspecified: Secondary | ICD-10-CM

## 2021-12-24 NOTE — Progress Notes (Signed)
Department of Dental Medicine    Service Date:   12/24/2021  Patient Name:  Alan LEGATE Sr. Date of Birth:   01/22/1956 Medical Record Number: [id number]  TODAY'S VISIT: RESTORATIVE   DIAGNOSIS: Teeth #'s 2, 3 & 5 caries; tooth #2 defective restoration w/ recurrent decay Patient w/ history of head & neck radiation therapy. PROCEDURES: Restorative on #2, #3 & #5 Upper & lower fluoride tray delivery. PLAN: Continue/resume using fluoride trays at bedtime. Call if any questions or concerns arise before next visit.   Next visit:  Continue w/ restorative per treatment plan (UL)   12/24/2021   HISTORY OF PRESENT ILLNESS: Alan CZAPLICKI Sr. is a 66 y.o. male who presents today for restorative treatment on teeth numbers 2, 3 and 5 per treatment plan and upper+lower fluoride tray delivery (replacement set). Medical and dental history reviewed with the patient.  No changes were reported.   CHIEF COMPLAINT:  Here for a routine dental appointment; patient with no complaints.   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   Chronic periodontitis 05/20/2021   Incipient enamel caries 05/20/2021   Abfraction 05/20/2021   Acquired lymphedema 05/08/2021   Abnormal posture 05/08/2021   Leukopenia due to antineoplastic chemotherapy ([place]) 11/27/2020   Chemotherapy induced nausea and vomiting 11/06/2020   Weight loss, unintentional 11/06/2020   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    Essential hypertension 11/14/2019   Hyperlipidemia 11/14/2019   Family history of heart disease 11/14/2019   Chest pain of uncertain etiology 85/88/5027   Nonspecific abnormal electrocardiogram (ECG) (EKG) 11/14/2019   Past Medical History:  Diagnosis Date   Chest pain    2021   History of kidney stones    Hypertension    Nasopharyngeal cancer ([place])    Pneumonia    Sleep apnea    Past Surgical History:  Procedure Laterality Date   FINE NEEDLE ASPIRATION BIOPSY     HERNIA REPAIR     Umbilicatl hernia   IR GASTROSTOMY TUBE REMOVAL  05/01/2021   IR IMAGING GUIDED PORT INSERTION  10/18/2020   IR REMOVAL TUN ACCESS W/ PORT W/O FL MOD SED  05/01/2021   LAPAROSCOPIC INSERTION GASTROSTOMY TUBE N/A 10/23/2020   Procedure: LAPAROSCOPIC ASSISTED PEG TUBE;  Surgeon: Dwan Bolt, MD;  Location: [facility];  Service: General;  Laterality: N/A;  60   ROTATOR CUFF REPAIR     Torn Labrum     No Known Allergies Current Outpatient Medications  Medication Sig Dispense Refill   amLODipine (NORVASC) 10 MG tablet Take 10 mg by mouth daily.     atorvastatin (LIPITOR) 80 MG tablet Take 80 mg by mouth daily.     cetirizine (ZYRTEC) 10 MG tablet Take 10 mg by mouth daily.     ergocalciferol (VITAMIN D2) 1.25 MG (50000 UT) capsule Take 50,000 Units by mouth once a week. Wednesday     nitroGLYCERIN (NITROSTAT) 0.4 MG SL tablet Place 0.4 mg under the tongue every 5 (five) minutes x 3 doses as needed  for chest pain.     omeprazole (PRILOSEC) 20 MG capsule Take 20 mg by mouth daily.     sildenafil (VIAGRA) 50 MG tablet Take 50-100 mg by mouth daily as needed for erectile dysfunction.     sodium fluoride (SODIUM FLUORIDE 5000 PPM) 1.1 % GEL dental gel Take 1 application by mouth at bedtime. Place 1 drop into each tooth space and leave in mouth for 5 minutes at bedtime.  Do not rinse with water, eat or drink for at least 30 minutes after use. 120 mL 11   No current facility-administered medications for this  visit.    LABS: Lab Results  Component Value Date   WBC 3.5 (L) 10/22/2021   HGB 14.3 10/22/2021   HCT 42.5 10/22/2021   MCV 92.6 10/22/2021   PLT 191 10/22/2021      Component Value Date/Time   NA 139 10/22/2021 0915   K 3.9 10/22/2021 0915   CL 101 10/22/2021 0915   CO2 30 10/22/2021 0915   GLUCOSE 131 (H) 10/22/2021 0915   BUN 20 10/22/2021 0915   CREATININE 1.30 (H) 10/22/2021 0915   CALCIUM 9.8 10/22/2021 0915   GFRNONAA >60 10/22/2021 0915   GFRAA 34 (L) 11/20/2015 1436   No results found for: "INR", "PROTIME" No results found for: "PTT"  Social History   Socioeconomic History   Marital status: Married    Spouse name: Not on file   Number of children: Not on file   Years of education: Not on file   Highest education level: Not on file  Occupational History   Not on file  Tobacco Use   Smoking status: Never   Smokeless tobacco: Never  Vaping Use   Vaping Use: Never used  Substance and Sexual Activity   Alcohol use: Not Currently    Comment: very rarely   Drug use: No   Sexual activity: Not Currently  Other Topics Concern   Not on file  Social History Narrative   Not on file   Social Determinants of Health   Financial Resource Strain: Not on file  Food Insecurity: No Food Insecurity (10/15/2020)   Hunger Vital Sign    Worried About Running Out of Food in the Last Year: Never true    Ran Out of Food in the Last Year: Never true  Transportation Needs: No Transportation Needs (10/15/2020)   PRAPARE - Hydrologist (Medical): No    Lack of Transportation (Non-Medical): No  Physical Activity: Not on file  Stress: No Stress Concern Present (10/15/2020)   [place]    Feeling of Stress : Not at all  Social Connections: [place] (10/15/2020)   Social Connection and Isolation Panel [NHANES]    Frequency of Communication with Friends and Family: More  than three times a week    Frequency of Social Gatherings with Friends and Family: Twice a week    Attends Religious Services: More than 4 times per year    Active Member of [employer] or Organizations: Yes    Attends Archivist Meetings: 1 to 4 times per year    Marital Status: Married  Human resources officer Violence: Not on file   No family history on file.   ANTIBIOTIC PROPHYLAXIS/OTHER PREMEDICATION: None indicated   VITAL SIGNS: BP 140/82 (BP Location: Right Arm, Patient Position: Sitting, Cuff Size: Normal)   Pulse 60   Temp 99.5 F (37.5 C) (Oral)  ASSESSMENT/INDICATION(S): Dental caries Defective restoration (tooth #2) with recurrent decay Patient with history of head and neck radiation therapy.   PROCEDURES: Restorative treatment on teeth numbers 75MO, 3DO and 5B(V). Anesthesia: Topical:  Benzocaine 20% applied Type of anesthesia used:  34 mg lidocaine, 0.018 mg epinephrine Location given:  Upper right quadrant infiltration Aspiration negative. Composite restorations: Cotton roll isolation. Excavated decay from #2, #3 and #5. Teeth numbers 75MO, 3DO and 5B(V) prepared for composite. The extent of caries extended subgingivally on #3 and #5 and into dentin. Etched enamel and dentin surfaces with 32% phosphoric acid for 15 seconds and rinsed thoroughly. Removed excess water with a brief burst of air. Optibond bonding agent placed and air dried until no movement of bonding agent was seen and then light cured for 10 seconds. OMNICHROMA flowable composite material placed in increments and light-cured. Removed excess material with carbide finishing bur. Occlusion, margins and contacts were verified and adjusted as needed.  Restorations were finished and polished. The patient was advised of possible normal sensitivity to hot and cold for the next few days/weeks.  Delivery of upper and lower fluoride trays (replacement set). Appliances were tried in and adjusted as  needed.  Polished. Postoperative instructions were provided in a written and verbal format concerning the use and care of appliances.    PLAN:  Continue/resume using fluoride trays every night before bed. Call if any questions or concerns arise before next dental visit.   Next visit:  Continue w/ restorative (upper left)  All questions and concerns were invited and addressed.  The patient tolerated today's visit well and departed in stable condition.  Charlaine Dalton, D.M.D.

## 2022-01-15 ENCOUNTER — Ambulatory Visit (INDEPENDENT_AMBULATORY_CARE_PROVIDER_SITE_OTHER): Payer: Dental | Admitting: Dentistry

## 2022-01-15 ENCOUNTER — Encounter (HOSPITAL_COMMUNITY): Payer: Self-pay | Admitting: Dentistry

## 2022-01-15 VITALS — BP 125/64 | HR 71 | Temp 98.6°F

## 2022-01-15 DIAGNOSIS — K029 Dental caries, unspecified: Secondary | ICD-10-CM

## 2022-01-15 NOTE — Progress Notes (Signed)
Department of Dental Medicine    Service Date:   01/15/2022  Patient Name:  Alan BEAUCHAINE Sr. Date of Birth:   06-22-1956 Medical Record Number: [id number]  TODAY'S VISIT RESTORATIVE   DIAGNOSIS: Teeth #'s 12, 13, 14 & 15 caries PROCEDURES: Restorative on #12, #13, #14 & #15 PLAN: Continue using fluoride trays at night. Call if any questions or concerns arise before your next visit.  NEXT VISIT:   Continue w/ restorative per treatment plan   01/15/2022  HISTORY OF PRESENT ILLNESS: Alan FEHRINGER Sr. is a 66 y.o. male who presents today for restorative treatment on teeth number 12, 13, 14 and 15 per treatment plan. Medical and dental history reviewed with the patient.  No changes reported.   CHIEF COMPLAINT:  Here for a routine dental appointment; patient with no complaints.  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   Chronic periodontitis 05/20/2021   Incipient enamel caries 05/20/2021   Abfraction 05/20/2021   Acquired lymphedema 05/08/2021   Abnormal posture 05/08/2021   Leukopenia due to antineoplastic chemotherapy ([place]) 11/27/2020   Chemotherapy induced nausea and vomiting 11/06/2020   Weight loss, unintentional 11/06/2020   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   Essential hypertension 11/14/2019   Hyperlipidemia 11/14/2019   Family history of heart disease 11/14/2019   Chest pain of uncertain etiology 92/05/9416   Nonspecific abnormal electrocardiogram (ECG)  (EKG) 11/14/2019   Past Medical History:  Diagnosis Date   Chest pain    2021   History of kidney stones    Hypertension    Nasopharyngeal cancer ([place])    Pneumonia    Sleep apnea    Past Surgical History:  Procedure Laterality Date   FINE NEEDLE ASPIRATION BIOPSY     HERNIA REPAIR     Umbilicatl hernia   IR GASTROSTOMY TUBE REMOVAL  05/01/2021   IR IMAGING GUIDED PORT INSERTION  10/18/2020   IR REMOVAL TUN ACCESS W/ PORT W/O FL MOD SED  05/01/2021   LAPAROSCOPIC INSERTION GASTROSTOMY TUBE N/A 10/23/2020   Procedure: LAPAROSCOPIC ASSISTED PEG TUBE;  Surgeon: Dwan Bolt, MD;  Location: [facility];  Service: General;  Laterality: N/A;  60   ROTATOR CUFF REPAIR     Torn Labrum     No Known Allergies Current Outpatient Medications  Medication Sig Dispense Refill   amLODipine (NORVASC) 10 MG tablet Take 10 mg by mouth daily.     atorvastatin (LIPITOR) 80 MG tablet Take 80 mg by mouth daily.     cetirizine (ZYRTEC) 10 MG tablet Take 10 mg by mouth daily.     ergocalciferol (VITAMIN D2) 1.25 MG (50000 UT) capsule Take 50,000 Units by mouth once a week. Wednesday     nitroGLYCERIN (NITROSTAT) 0.4 MG SL tablet Place 0.4 mg under the tongue every 5 (five) minutes x 3 doses as needed for chest pain.     omeprazole (PRILOSEC) 20 MG capsule Take 20 mg by mouth daily.     sildenafil (VIAGRA) 50 MG tablet Take 50-100  mg by mouth daily as needed for erectile dysfunction.     sodium fluoride (SODIUM FLUORIDE 5000 PPM) 1.1 % GEL dental gel Take 1 application by mouth at bedtime. Place 1 drop into each tooth space and leave in mouth for 5 minutes at bedtime.  Do not rinse with water, eat or drink for at least 30 minutes after use. 120 mL 11   No current facility-administered medications for this visit.    LABS: Lab Results  Component Value Date   WBC 3.5 (L) 10/22/2021   HGB 14.3 10/22/2021   HCT 42.5 10/22/2021   MCV 92.6 10/22/2021   PLT 191 10/22/2021      Component Value Date/Time    NA 139 10/22/2021 0915   K 3.9 10/22/2021 0915   CL 101 10/22/2021 0915   CO2 30 10/22/2021 0915   GLUCOSE 131 (H) 10/22/2021 0915   BUN 20 10/22/2021 0915   CREATININE 1.30 (H) 10/22/2021 0915   CALCIUM 9.8 10/22/2021 0915   GFRNONAA >60 10/22/2021 0915   GFRAA 34 (L) 11/20/2015 1436   No results found for: "INR", "PROTIME" No results found for: "PTT"  Social History   Socioeconomic History   Marital status: Married    Spouse name: Not on file   Number of children: Not on file   Years of education: Not on file   Highest education level: Not on file  Occupational History   Not on file  Tobacco Use   Smoking status: Never   Smokeless tobacco: Never  Vaping Use   Vaping Use: Never used  Substance and Sexual Activity   Alcohol use: Not Currently    Comment: very rarely   Drug use: No   Sexual activity: Not Currently  Other Topics Concern   Not on file  Social History Narrative   Not on file   Social Determinants of Health   Financial Resource Strain: Not on file  Food Insecurity: No Food Insecurity (10/15/2020)   Hunger Vital Sign    Worried About Running Out of Food in the Last Year: Never true    Ran Out of Food in the Last Year: Never true  Transportation Needs: No Transportation Needs (10/15/2020)   PRAPARE - Hydrologist (Medical): No    Lack of Transportation (Non-Medical): No  Physical Activity: Not on file  Stress: No Stress Concern Present (10/15/2020)   [place]    Feeling of Stress : Not at all  Social Connections: [place] (10/15/2020)   Social Connection and Isolation Panel [NHANES]    Frequency of Communication with Friends and Family: More than three times a week    Frequency of Social Gatherings with Friends and Family: Twice a week    Attends Religious Services: More than 4 times per year    Active Member of [employer] or Organizations: Yes     Attends Archivist Meetings: 1 to 4 times per year    Marital Status: Married  Human resources officer Violence: Not on file   History reviewed. No pertinent family history.   ANTIBIOTIC PROPHYLAXIS/OTHER PREMEDICATION: None indicated   VITAL SIGNS: BP 125/64 (BP Location: Right Arm, Patient Position: Sitting, Cuff Size: Normal)   Pulse 71   Temp 98.6 F (37 C) (Oral)    RADIOGRAPH(S): Updated BW's taken on 12/17/21:    ASSESSMENT/INDICATION(S): Dental caries; high caries risk   PROCEDURES: Restorative treatment on teeth numbers 12DO, 13DO, 14O and 15O.  ANESTHESIA: Topical:  Benzocaine 20% applied Type of anesthesia used:  34 mg lidocaine, 0.018 mg epinephrine Location given:  Upper left quadrant infiltration Aspiration negative COMPOSITE RESTORATIONS: Cotton roll isolation. Excavated decay from teeth numbers 12, 13, 14 and 15. Teeth #12DO, #13DO, #14O and #15O prepared for composite. The extent of caries was into dentin on all teeth. Etched enamel and dentin surfaces with 32% phosphoric acid for 15 seconds and rinsed thoroughly. Removed excess water with a brief burst of air. Optibond bonding agent placed and air dried until no movement of bonding agent was seen and then light cured for 10 seconds. OMNICHROMA flowable composite material placed in increments and light-cured. Removed excess material with carbide finishing bur. Occlusion, margins and contacts were verified and adjusted as needed.  Restorations were finished and polished. The patient was advised of possible normal sensitivity to hot and cold for the next few days/weeks.   PLAN:  Call if any questions or concerns arise before next visit. Continue using fluoride trays every day at night.   NEXT VISIT:  Restorative per treatment plan (LLQ)   All questions and concerns were invited and addressed.  The patient tolerated today's visit well and departed in stable condition.   - Deanthony Maull B. Benson [place],  DMD

## 2022-02-11 ENCOUNTER — Ambulatory Visit (INDEPENDENT_AMBULATORY_CARE_PROVIDER_SITE_OTHER): Payer: Dental | Admitting: Dentistry

## 2022-02-11 VITALS — BP 157/67 | HR 77 | Temp 98.7°F

## 2022-02-11 DIAGNOSIS — K029 Dental caries, unspecified: Secondary | ICD-10-CM | POA: Diagnosis not present

## 2022-02-11 NOTE — Progress Notes (Signed)
Department of Dental Medicine    Service Date:   02/11/2022  Patient Name:  Alan PURSELL Sr. Date of Birth:   [birth year] Medical Record Number: [id number]  TODAY'S VISIT: RESTORATIVE   DIAGNOSIS: Teeth #'s 20, 21, 23, 25 & 26 caries PROCEDURES: Restorative on #20, #21, #23, #25 & #26 PLAN:  NEXT VISIT:  Continue w/ restorative per treatment plan   02/11/2022  HISTORY OF PRESENT ILLNESS: Alan EAKLE Sr. is a 66 y.o. male who presents today for restorative treatment on teeth numbers 20, 21, 23, 25 and 26 per treatment plan. Medical and dental history reviewed with the patient.  No changes reported.   CHIEF COMPLAINT:  Here for a routine dental appointment.  He reports some discomfort on chewing on the left side (top and bottom) since his last visit.  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   Chronic periodontitis 05/20/2021   Incipient enamel caries 05/20/2021   Abfraction 05/20/2021   Acquired lymphedema 05/08/2021   Abnormal posture 05/08/2021   Leukopenia due to antineoplastic chemotherapy ([place]) 11/27/2020   Chemotherapy induced nausea and vomiting 11/06/2020   Weight loss, unintentional 11/06/2020   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   Essential hypertension 11/14/2019   Hyperlipidemia 11/14/2019   Family history of heart disease 11/14/2019   Chest pain of uncertain etiology 36/14/4315   Nonspecific abnormal electrocardiogram (ECG) (EKG) 11/14/2019   Past  Medical History:  Diagnosis Date   Chest pain    2021   History of kidney stones    Hypertension    Nasopharyngeal cancer ([place])    Pneumonia    Sleep apnea    Past Surgical History:  Procedure Laterality Date   FINE NEEDLE ASPIRATION BIOPSY     HERNIA REPAIR     Umbilicatl hernia   IR GASTROSTOMY TUBE REMOVAL  05/01/2021   IR IMAGING GUIDED PORT INSERTION  10/18/2020   IR REMOVAL TUN ACCESS W/ PORT W/O FL MOD SED  05/01/2021   LAPAROSCOPIC INSERTION GASTROSTOMY TUBE N/A 10/23/2020   Procedure: LAPAROSCOPIC ASSISTED PEG TUBE;  Surgeon: Dwan Bolt, MD;  Location: [facility];  Service: General;  Laterality: N/A;  60   ROTATOR CUFF REPAIR     Torn Labrum     No Known Allergies Current Outpatient Medications  Medication Sig Dispense Refill   amLODipine (NORVASC) 10 MG tablet Take 10 mg by mouth daily.     atorvastatin (LIPITOR) 80 MG tablet Take 80 mg by mouth daily.     cetirizine (ZYRTEC) 10 MG tablet Take 10 mg by mouth daily.     ergocalciferol (VITAMIN D2) 1.25 MG (50000 UT) capsule Take 50,000 Units by mouth once a week. Wednesday     nitroGLYCERIN (NITROSTAT) 0.4 MG SL tablet Place 0.4 mg under the tongue every 5 (five) minutes x 3 doses as needed for chest pain.     omeprazole (PRILOSEC) 20 MG capsule Take 20 mg by mouth daily.     sildenafil (VIAGRA) 50 MG tablet Take 50-100 mg  by mouth daily as needed for erectile dysfunction.     sodium fluoride (SODIUM FLUORIDE 5000 PPM) 1.1 % GEL dental gel Take 1 application by mouth at bedtime. Place 1 drop into each tooth space and leave in mouth for 5 minutes at bedtime.  Do not rinse with water, eat or drink for at least 30 minutes after use. 120 mL 11   No current facility-administered medications for this visit.    LABS: Lab Results  Component Value Date   WBC 3.5 (L) 10/22/2021   HGB 14.3 10/22/2021   HCT 42.5 10/22/2021   MCV 92.6 10/22/2021   PLT 191 10/22/2021      Component Value Date/Time   NA 139 10/22/2021 0915    K 3.9 10/22/2021 0915   CL 101 10/22/2021 0915   CO2 30 10/22/2021 0915   GLUCOSE 131 (H) 10/22/2021 0915   BUN 20 10/22/2021 0915   CREATININE 1.30 (H) 10/22/2021 0915   CALCIUM 9.8 10/22/2021 0915   GFRNONAA >60 10/22/2021 0915   GFRAA 34 (L) 11/20/2015 1436   No results found for: "INR", "PROTIME" No results found for: "PTT"  Social History   Socioeconomic History   Marital status: Married    Spouse name: Not on file   Number of children: Not on file   Years of education: Not on file   Highest education level: Not on file  Occupational History   Not on file  Tobacco Use   Smoking status: Never   Smokeless tobacco: Never  Vaping Use   Vaping Use: Never used  Substance and Sexual Activity   Alcohol use: Not Currently    Comment: very rarely   Drug use: No   Sexual activity: Not Currently  Other Topics Concern   Not on file  Social History Narrative   Not on file   Social Determinants of Health   Financial Resource Strain: Not on file  Food Insecurity: No Food Insecurity (10/15/2020)   Hunger Vital Sign    Worried About Running Out of Food in the Last Year: Never true    Ran Out of Food in the Last Year: Never true  Transportation Needs: No Transportation Needs (10/15/2020)   PRAPARE - Hydrologist (Medical): No    Lack of Transportation (Non-Medical): No  Physical Activity: Not on file  Stress: No Stress Concern Present (10/15/2020)   [place]    Feeling of Stress : Not at all  Social Connections: [place] (10/15/2020)   Social Connection and Isolation Panel [NHANES]    Frequency of Communication with Friends and Family: More than three times a week    Frequency of Social Gatherings with Friends and Family: Twice a week    Attends Religious Services: More than 4 times per year    Active Member of [employer] or Organizations: Yes    Attends Theatre manager Meetings: 1 to 4 times per year    Marital Status: Married  Human resources officer Violence: Not on file   No family history on file.   ANTIBIOTIC PROPHYLAXIS/OTHER PREMEDICATION: None indicated   VITAL SIGNS: BP (!) 157/67 (BP Location: Right Arm, Patient Position: Sitting, Cuff Size: Normal)   Pulse 77   Temp 98.7 F (37.1 C) (Oral)    RADIOGRAPH(S): BW taken on 12/17/21.    ASSESSMENT/INDICATION(S): Dental caries   PROCEDURES: Restorative treatment on teeth #20O, #21DO, #23I, #25I and #26I. ANESTHESIA: Topical:  Benzocaine 20%  applied Type of anesthesia used:  34 mg lidocaine, 0.018 mg epinephrine Location given:  Lower left quadrant infiltration and mental nerve block Aspiration negative. COMPOSITE RESTORATIONS: Cotton roll isolation. Excavated decay from teeth numbers 20, 21, 23, 25 and 26. Teeth numbers 20O, 21DO, 23I, 25I and 26I were prepared for composite. The extent of caries was into dentin. Etched enamel and dentin surfaces  with 32% phosphoric acid for 15 seconds and rinsed thoroughly. Removed excess water with a brief burst of air. Optibond bonding agent placed and air dried until no movement of bonding agent was seen and then light cured for 10 seconds. OMNICHROMA flowable composite material placed in increments and light-cured. Removed excess material with carbide finishing bur. Occlusion, margins and contacts were verified and adjusted as needed. Occlusal restoration on tooth #14 was also adjusted because restoration was high (restoration was completed at last visit). Restorations were finished and polished. The patient was advised of possible normal sensitivity to hot and cold for the next few days/weeks.     PLAN:  Call if any questions or concerns arise before next visit.  NEXT VISIT:  Continue with restorative per treatment plan  All questions and concerns were invited and addressed.  The patient tolerated today's visit well and  departed in stable condition.  - Amarra Sawyer B. Benson [place], DMD

## 2022-03-25 ENCOUNTER — Ambulatory Visit (INDEPENDENT_AMBULATORY_CARE_PROVIDER_SITE_OTHER): Payer: Dental | Admitting: Dentistry

## 2022-03-25 ENCOUNTER — Encounter (HOSPITAL_COMMUNITY): Payer: Self-pay | Admitting: Dentistry

## 2022-03-25 VITALS — BP 158/98 | HR 83 | Temp 98.2°F

## 2022-03-25 DIAGNOSIS — K029 Dental caries, unspecified: Secondary | ICD-10-CM | POA: Diagnosis not present

## 2022-03-25 NOTE — Progress Notes (Signed)
[facility]      TODAY'S VISIT    RESTORATIVE   DIAGNOSIS: Teeth #'s 29 & 30 caries PROCEDURES: Restorative on #29 & #30  NEXT VISIT:  Continue w/ restorative per treatment plan    Service Date:   03/25/2022  Patient Name:   Alan ALCOSER Sr. Date of Birth:   03-23-1956 Medical Record Number: [id number]   HISTORY OF PRESENT ILLNESS: Alan HNAT Sr. is a 66 y.o. male who presents today for restorative treatment on teeth numbers 29 and 30 per treatment plan. Medical and dental history reviewed with the patient.  No changes were reported.   CHIEF COMPLAINT:  Here for a routine dental appointment. Patient with no complaints.   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   Chronic periodontitis 05/20/2021   Incipient enamel caries 05/20/2021   Abfraction 05/20/2021   Acquired lymphedema 05/08/2021   Abnormal posture 05/08/2021   Leukopenia due to antineoplastic chemotherapy ([place]) 11/27/2020   Chemotherapy induced nausea and vomiting 11/06/2020   Weight loss, unintentional 11/06/2020   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   Essential hypertension 11/14/2019   Hyperlipidemia 11/14/2019   Family history of heart disease 11/14/2019   Chest pain of uncertain etiology 95/62/1308   Nonspecific abnormal electrocardiogram (ECG) (EKG) 11/14/2019   Past Medical History:  Diagnosis Date   Chest pain    2021   History of kidney  stones    Hypertension    Nasopharyngeal cancer ([place])    Pneumonia    Sleep apnea    Past Surgical History:  Procedure Laterality Date   FINE NEEDLE ASPIRATION BIOPSY     HERNIA REPAIR     Umbilicatl hernia   IR GASTROSTOMY TUBE REMOVAL  05/01/2021   IR IMAGING GUIDED PORT INSERTION  10/18/2020   IR REMOVAL TUN ACCESS W/ PORT W/O FL MOD SED  05/01/2021   LAPAROSCOPIC INSERTION GASTROSTOMY TUBE N/A 10/23/2020   Procedure: LAPAROSCOPIC ASSISTED PEG TUBE;  Surgeon: Dwan Bolt, MD;  Location: [facility];  Service: General;  Laterality: N/A;  60   ROTATOR CUFF REPAIR     Torn Labrum     No Known Allergies Current Outpatient Medications  Medication Sig Dispense Refill   amLODipine (NORVASC) 10 MG tablet Take 10 mg by mouth daily.     atorvastatin (LIPITOR) 80 MG tablet Take 80 mg by mouth daily.     cetirizine (ZYRTEC) 10 MG tablet Take 10 mg by mouth daily.     ergocalciferol (VITAMIN D2) 1.25 MG (50000 UT) capsule Take 50,000 Units by mouth once a week. Wednesday     nitroGLYCERIN (NITROSTAT) 0.4 MG SL tablet Place 0.4 mg under the tongue every 5 (five) minutes x 3 doses as needed for chest pain.     omeprazole (PRILOSEC) 20 MG capsule Take 20 mg by mouth daily.     sildenafil (VIAGRA) 50 MG tablet Take 50-100 mg by mouth daily as needed for erectile dysfunction.     sodium  fluoride (SODIUM FLUORIDE 5000 PPM) 1.1 % GEL dental gel Take 1 application by mouth at bedtime. Place 1 drop into each tooth space and leave in mouth for 5 minutes at bedtime.  Do not rinse with water, eat or drink for at least 30 minutes after use. 120 mL 11   No current facility-administered medications for this visit.    LABS: Lab Results  Component Value Date   WBC 3.5 (L) 10/22/2021   HGB 14.3 10/22/2021   HCT 42.5 10/22/2021   MCV 92.6 10/22/2021   PLT 191 10/22/2021      Component Value Date/Time   NA 139 10/22/2021 0915   K 3.9 10/22/2021 0915   CL 101 10/22/2021 0915   CO2 30 10/22/2021 0915    GLUCOSE 131 (H) 10/22/2021 0915   BUN 20 10/22/2021 0915   CREATININE 1.30 (H) 10/22/2021 0915   CALCIUM 9.8 10/22/2021 0915   GFRNONAA >60 10/22/2021 0915   GFRAA 34 (L) 11/20/2015 1436   No results found for: "INR", "PROTIME" No results found for: "PTT"  Social History   Socioeconomic History   Marital status: Married    Spouse name: Not on file   Number of children: Not on file   Years of education: Not on file   Highest education level: Not on file  Occupational History   Not on file  Tobacco Use   Smoking status: Never   Smokeless tobacco: Never  Vaping Use   Vaping Use: Never used  Substance and Sexual Activity   Alcohol use: Not Currently    Comment: very rarely   Drug use: No   Sexual activity: Not Currently  Other Topics Concern   Not on file  Social History Narrative   Not on file   Social Determinants of Health   Financial Resource Strain: Not on file  Food Insecurity: No Food Insecurity (10/15/2020)   Hunger Vital Sign    Worried About Running Out of Food in the Last Year: Never true    Ran Out of Food in the Last Year: Never true  Transportation Needs: No Transportation Needs (10/15/2020)   PRAPARE - Hydrologist (Medical): No    Lack of Transportation (Non-Medical): No  Physical Activity: Not on file  Stress: No Stress Concern Present (10/15/2020)   [place]    Feeling of Stress : Not at all  Social Connections: [place] (10/15/2020)   Social Connection and Isolation Panel [NHANES]    Frequency of Communication with Friends and Family: More than three times a week    Frequency of Social Gatherings with Friends and Family: Twice a week    Attends Religious Services: More than 4 times per year    Active Member of [employer] or Organizations: Yes    Attends Archivist Meetings: 1 to 4 times per year    Marital Status: Married  Arboriculturist Violence: Not on file   History reviewed. No pertinent family history.   ANTIBIOTIC PROPHYLAXIS/OTHER PREMEDICATION: [N/A]   VITAL SIGNS: BP (!) 158/98 (BP Location: Left Arm, Patient Position: Sitting, Cuff Size: Normal)   Pulse 83   Temp 98.2 F (36.8 C) (Oral)    RADIOGRAPH(S): BW taken on 12/17/21.     ASSESSMENT/INDICATION(S): Caries   PROCEDURES: Restorative treatment on #29MOD, #30B(V), #52MO and #30D. ANESTHESIA: Topical:  Benzocaine 20% applied Type of anesthesia used:  34 mg lidocaine, 0.018 mg epinephrine Location given:  Lower right quadrant mental nerve block, #29 and #30 buccal infiltration. Aspiration negative. COMPOSITE + FUJI II RESTORATIONS: Cotton roll isolation. Excavated decay from teeth numbers 29 and 30. #29MOD, #30B(V), #48MO and #30D were prepared for composite. The extent of caries was into dentin on all preparations, however #30D extended subgingival and deeper towards pulp and some affected dentin was left behind.  #29MOD, #48MO:  Etched enamel and dentin surfaces  with 32% phosphoric acid for 15 seconds and rinsed thoroughly. Removed excess water with a brief burst of air. Optibond bonding agent placed and air dried until no movement of bonding agent was seen and then light cured for 10 seconds. OMNICHROMA flowable composite material was placed in increments and light-cured. Removed excess material with carbide finishing bur. Occlusion, margins and contacts were verified and adjusted as needed.  #30D, #30B(V): Etched enamel and dentin surfaces with Fuji II cavity conditioner for 15 seconds.  Removed excess water with a brief burst of air. FUJI 2 material was placed in increments and light-cured. Removed excess material with hand instruments. Margins and contacts were verified and adjusted as needed.  Restorations finished and polished. The patient was advised of possible normal sensitivity to hot and cold for the next few  days/weeks.   PLAN:   NEXT VISIT:  Restorative per treatment plan (LLQ)  All questions and concerns were invited and addressed.  The patient tolerated today's visit well and departed in stable condition.  -Sandi Mariscal, DMD

## 2022-04-23 ENCOUNTER — Ambulatory Visit: Payer: 59 | Admitting: Hematology and Oncology

## 2022-04-27 IMAGING — CT NM PET TUM IMG INITIAL (PI) SKULL BASE T - THIGH
7 series · 25 of 25 positions shown · non-contrast
Comparison: CT neck 08/07/2020

CLINICAL DATA: Initial treatment strategy for squamous cell
carcinoma of the head and neck.

EXAM:
NUCLEAR MEDICINE PET SKULL BASE TO THIGH
TECHNIQUE: 10.77 mCi F-18 FDG was injected intravenously. Full-ring PET imaging
was performed from the skull base to thigh after the radiotracer. CT
data was obtained and used for attenuation correction and anatomic
localization.
Fasting blood glucose: 113 mg/dl

[Series 3: pet hn_sk_thigh ac · axial · 5.0mm · 4.07mm/px · z∈[-876,+92]mm · 6 of 243 slices shown]
[im 1/243]
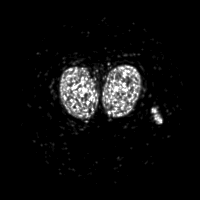
[im 49/243]
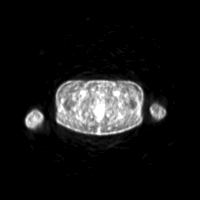
[im 97/243]
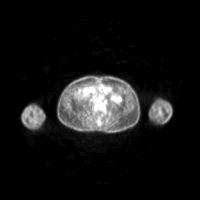
[im 146/243]
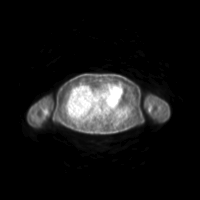
[im 194/243]
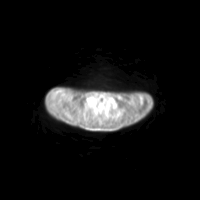
[im 243/243]
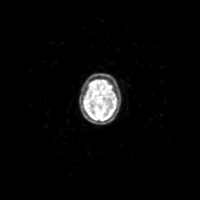

[Series 4: ct hn_sk_th 5.0 bf37 · axial · 5.0mm · 0.98mm/px · z∈[-876,+92]mm · 6 of 243 slices shown]
[im 1/243]
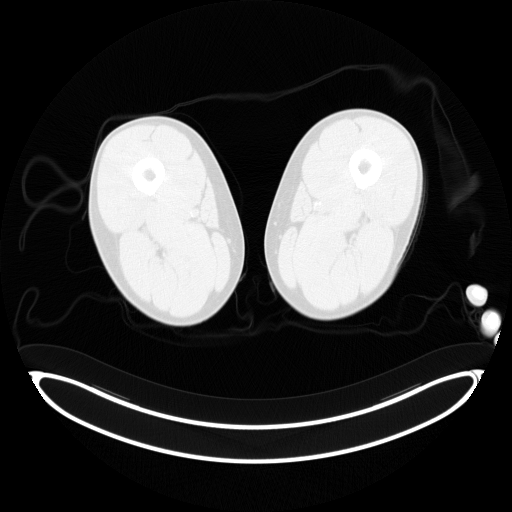
[im 49/243]
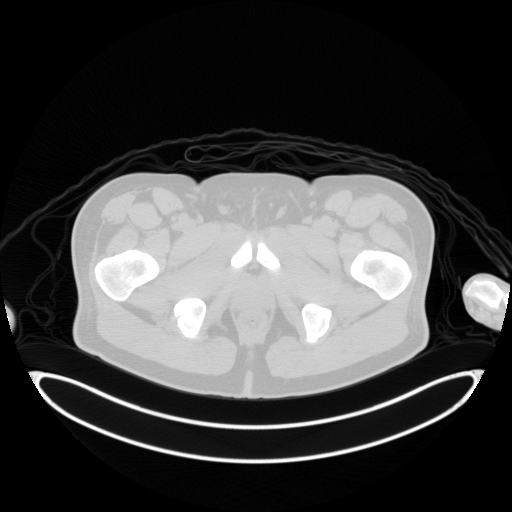
[im 97/243]
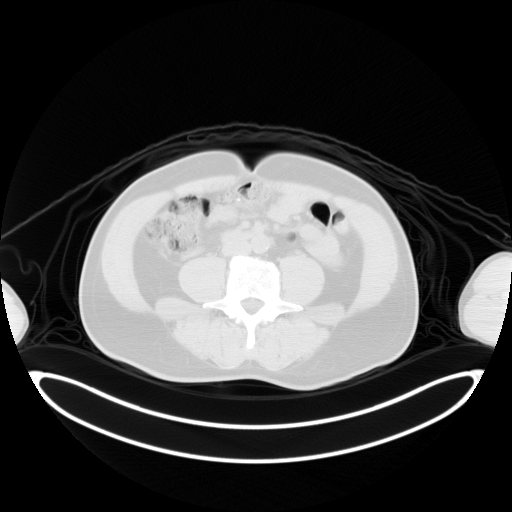
[im 146/243]
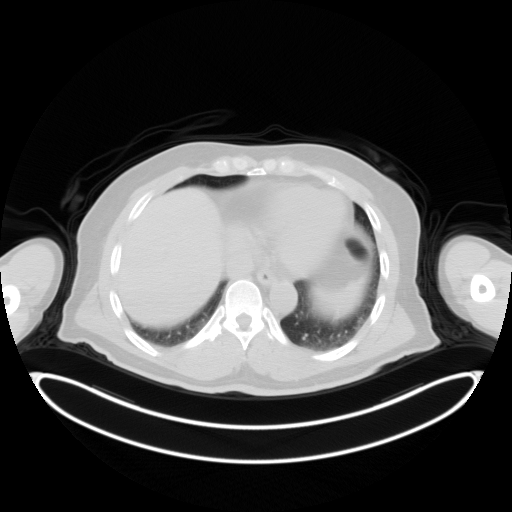
[im 194/243  brain]
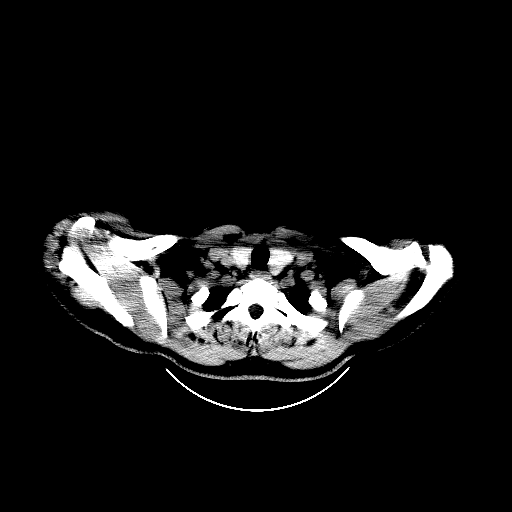
[im 243/243  brain]
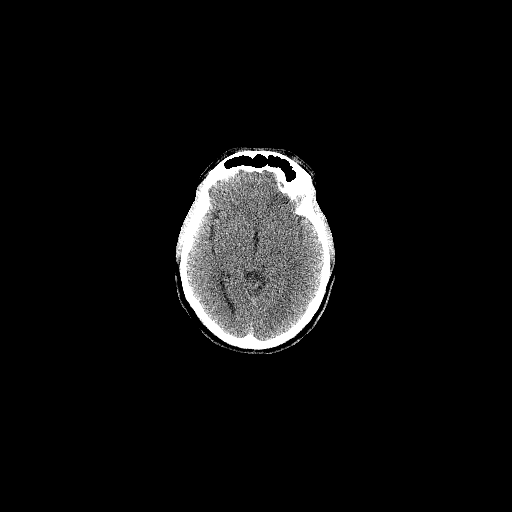

[Series 5: pet hn_sk_thigh nac · axial · 5.0mm · 4.07mm/px · z∈[-876,+92]mm · 5 of 243 slices shown]
[im 1/243]
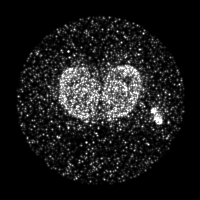
[im 61/243]
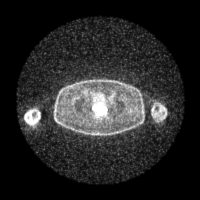
[im 122/243]
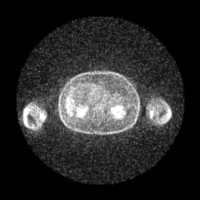
[im 182/243]
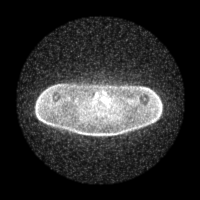
[im 243/243]
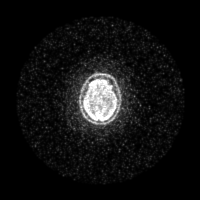

[Series 8: ct hn_sk_th 5.0 br59 lung_bone · axial · 5.0mm · 0.69mm/px · 1 of 66 slices shown]
[im 1/66]
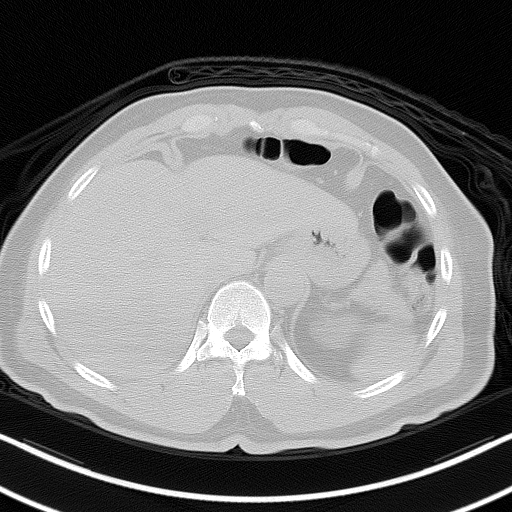

[Series 603: <mip collection> · coronal · 2.01mm/px · 1 of 32 slices shown]
[im 1/32]
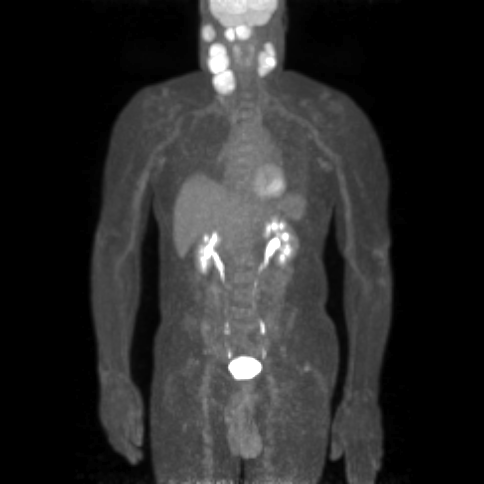

[Series 604: fused cor · 1 of 51 slices shown]
[im 1/51]
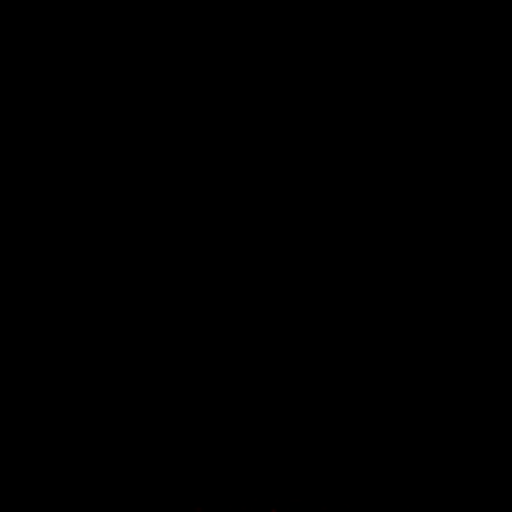

[Series 605: range-ct hn_sk_th 5.0 bf37-tra-<alpha range> · 5 of 235 slices shown]
[im 1/235]
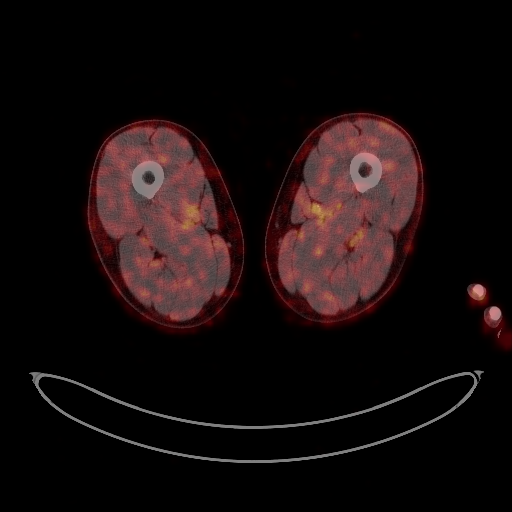
[im 59/235]
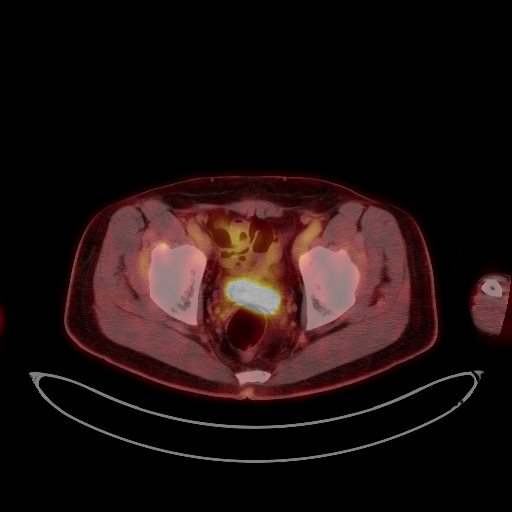
[im 118/235]
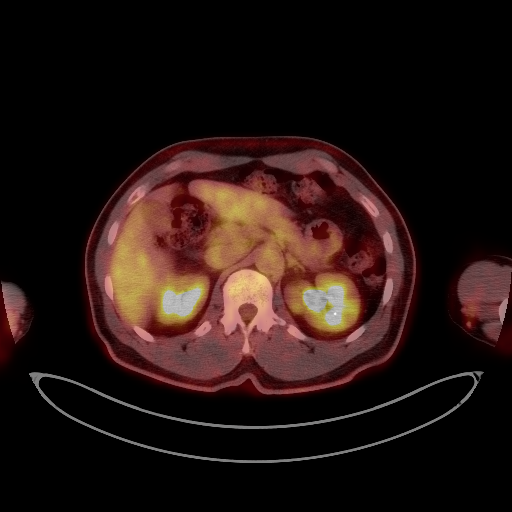
[im 176/235]
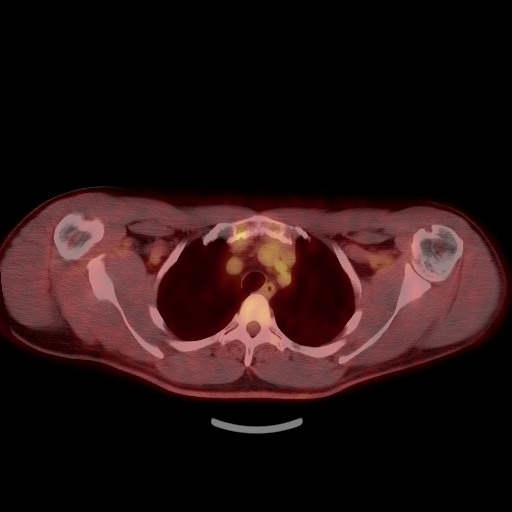
[im 235/235]
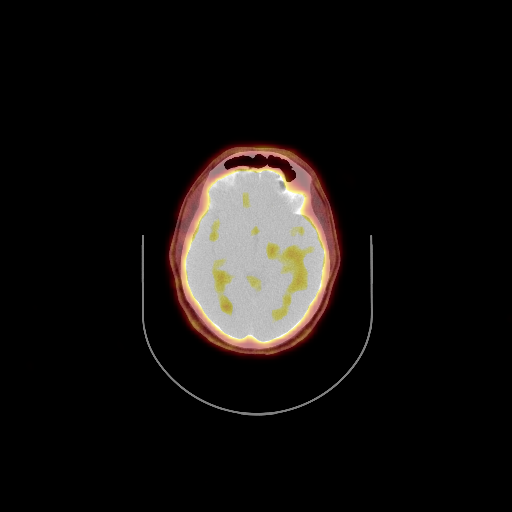

[25 of 25 positions shown; findings below may reference images not displayed]

FINDINGS: Mediastinal blood pool activity: SUV max

Liver activity: SUV max NA

NECK: Intense FDG uptake corresponding to increased posterior nasal
pharyngeal soft tissue has an SUV max of 17.29, image [DATE].

Bulky bilateral FDG avid cervical lymph nodes are identified,
including:

Right retropharyngeal lymph node measures 1.7 cm and has an SUV max
of 14.78, image [DATE].

Right parotid lymph node measures 2.6 cm within SUV max of 9.69.

Right level 2 node measures 3.1 cm and has an SUV max of 15.8, image
[DATE].

Right level [DATE] node measures 3.7 cm and has an SUV max of 15.34,
image 42/4.

Left level 2 node measures 3.2 cm and has an SUV max of 18.2.

Incidental CT findings: none

CHEST: Right supraclavicular node measures 0.7 cm within SUV max of
3.7, image 50/4.

No FDG avid axillary, mediastinal or hilar lymph nodes.

Incidental CT findings: No suspicious or FDG avid pulmonary nodules.
Tiny nodule in the lateral right apex measures 3 mm and is too small
to characterize by PET-CT, image [DATE]. Aortic atherosclerosis.
Coronary artery calcifications.

ABDOMEN/PELVIS: No abnormal FDG uptake within the liver, pancreas,
or spleen. No abnormal uptake within the adrenal glands. No
hypermetabolic abdominopelvic lymph nodes.

Incidental CT findings: Aortic atherosclerosis. Bilateral renal
calculi. No hydronephrosis.

SKELETON: No focal hypermetabolic activity to suggest skeletal
metastasis.

Incidental CT findings: none
IMPRESSION: 1. There is intense FDG uptake within the area of increased soft
tissue fullness in the posterior nasopharynx. Cannot exclude primary
nasopharyngeal neoplasm.
2. Extensive, bulky bilateral FDG avid cervical adenopathy. Large
FDG avid lymph node is also identified within the right parotid
gland. Imaging findings compatible with metastatic adenopathy.
3. Subcentimeter right supraclavicular lymph node exhibits mild FDG
uptake above background activity. Equivocal for nodal metastasis. No
additional signs of thoracic, abdominal, or pelvic metastasis. No
evidence for osseous metastatic disease.

## 2022-04-29 ENCOUNTER — Encounter (HOSPITAL_COMMUNITY): Payer: Self-pay | Admitting: Dentistry

## 2022-04-29 ENCOUNTER — Ambulatory Visit (INDEPENDENT_AMBULATORY_CARE_PROVIDER_SITE_OTHER): Payer: Dental | Admitting: Dentistry

## 2022-04-29 VITALS — BP 164/97 | HR 71 | Temp 98.7°F

## 2022-04-29 DIAGNOSIS — K029 Dental caries, unspecified: Secondary | ICD-10-CM

## 2022-04-29 NOTE — Progress Notes (Signed)
[facility] Department of Dental Medicine      TODAY'S VISIT    RESTORATIVE   DIAGNOSIS: Teeth #'s 18 & 19 caries PROCEDURES: Restorative on #18 & #19 PLAN:  NEXT VISIT:  Restorative per treatment plan (anterior lower teeth)    Service Date:   04/29/2022  Patient Name:   Alan GLENDENING Sr. Date of Birth:   01/25/1956 Medical Record Number: [id number]   HISTORY OF PRESENT ILLNESS: Alan METER Sr. is a 66 y.o. male who presents today for restorative treatment on teeth numbers 18 and 19 per treatment plan. Medical and dental history reviewed with the patient.  No changes reported.   CHIEF COMPLAINT:  Here for a routine dental appointment; patient with no complaints.   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   Chronic periodontitis 05/20/2021   Incipient enamel caries 05/20/2021   Abfraction 05/20/2021   Acquired lymphedema 05/08/2021   Abnormal posture 05/08/2021   Leukopenia due to antineoplastic chemotherapy ([place]) 11/27/2020   Chemotherapy induced nausea and vomiting 11/06/2020   Weight loss, unintentional 11/06/2020   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   Essential hypertension 11/14/2019   Hyperlipidemia 11/14/2019   Family history of heart disease 11/14/2019   Chest pain of uncertain etiology 37/16/9678   Nonspecific abnormal electrocardiogram (ECG) (EKG) 11/14/2019   Past Medical History:  Diagnosis Date   Chest pain    2021    History of kidney stones    Hypertension    Nasopharyngeal cancer ([place])    Pneumonia    Sleep apnea    Past Surgical History:  Procedure Laterality Date   FINE NEEDLE ASPIRATION BIOPSY     HERNIA REPAIR     Umbilicatl hernia   IR GASTROSTOMY TUBE REMOVAL  05/01/2021   IR IMAGING GUIDED PORT INSERTION  10/18/2020   IR REMOVAL TUN ACCESS W/ PORT W/O FL MOD SED  05/01/2021   LAPAROSCOPIC INSERTION GASTROSTOMY TUBE N/A 10/23/2020   Procedure: LAPAROSCOPIC ASSISTED PEG TUBE;  Surgeon: Dwan Bolt, MD;  Location: [facility];  Service: General;  Laterality: N/A;  60   ROTATOR CUFF REPAIR     Torn Labrum     No Known Allergies Current Outpatient Medications  Medication Sig Dispense Refill   amLODipine (NORVASC) 10 MG tablet Take 10 mg by mouth daily.     atorvastatin (LIPITOR) 80 MG tablet Take 80 mg by mouth daily.     cetirizine (ZYRTEC) 10 MG tablet Take 10 mg by mouth daily.     ergocalciferol (VITAMIN D2) 1.25 MG (50000 UT) capsule Take 50,000 Units by mouth once a week. Wednesday     nitroGLYCERIN (NITROSTAT) 0.4 MG SL tablet Place 0.4 mg under the tongue every 5 (five) minutes x 3 doses as needed for chest pain.     omeprazole (PRILOSEC) 20 MG capsule Take 20 mg by mouth daily.     sildenafil (VIAGRA) 50 MG tablet Take 50-100 mg by mouth daily as needed for erectile dysfunction.  sodium fluoride (SODIUM FLUORIDE 5000 PPM) 1.1 % GEL dental gel Take 1 application by mouth at bedtime. Place 1 drop into each tooth space and leave in mouth for 5 minutes at bedtime.  Do not rinse with water, eat or drink for at least 30 minutes after use. 120 mL 11   No current facility-administered medications for this visit.    LABS: Lab Results  Component Value Date   WBC 3.5 (L) 10/22/2021   HGB 14.3 10/22/2021   HCT 42.5 10/22/2021   MCV 92.6 10/22/2021   PLT 191 10/22/2021      Component Value Date/Time   NA 139 10/22/2021 0915   K 3.9 10/22/2021 0915   CL 101 10/22/2021 0915   CO2 30  10/22/2021 0915   GLUCOSE 131 (H) 10/22/2021 0915   BUN 20 10/22/2021 0915   CREATININE 1.30 (H) 10/22/2021 0915   CALCIUM 9.8 10/22/2021 0915   GFRNONAA >60 10/22/2021 0915   GFRAA 34 (L) 11/20/2015 1436   No results found for: "INR", "PROTIME" No results found for: "PTT"  Social History   Socioeconomic History   Marital status: Married    Spouse name: Not on file   Number of children: Not on file   Years of education: Not on file   Highest education level: Not on file  Occupational History   Not on file  Tobacco Use   Smoking status: Never   Smokeless tobacco: Never  Vaping Use   Vaping Use: Never used  Substance and Sexual Activity   Alcohol use: Not Currently    Comment: very rarely   Drug use: No   Sexual activity: Not Currently  Other Topics Concern   Not on file  Social History Narrative   Not on file   Social Determinants of Health   Financial Resource Strain: Not on file  Food Insecurity: No Food Insecurity (10/15/2020)   Hunger Vital Sign    Worried About Running Out of Food in the Last Year: Never true    Ran Out of Food in the Last Year: Never true  Transportation Needs: No Transportation Needs (10/15/2020)   PRAPARE - Hydrologist (Medical): No    Lack of Transportation (Non-Medical): No  Physical Activity: Not on file  Stress: No Stress Concern Present (10/15/2020)   [place]    Feeling of Stress : Not at all  Social Connections: [place] (10/15/2020)   Social Connection and Isolation Panel [NHANES]    Frequency of Communication with Friends and Family: More than three times a week    Frequency of Social Gatherings with Friends and Family: Twice a week    Attends Religious Services: More than 4 times per year    Active Member of [employer] or Organizations: Yes    Attends Archivist Meetings: 1 to 4 times per year    Marital Status:  Married  Human resources officer Violence: Not on file   No family history on file.   ANTIBIOTIC PROPHYLAXIS/OTHER PREMEDICATION: [N/A]   VITAL SIGNS: BP (!) 164/97 (BP Location: Right Arm, Patient Position: Sitting, Cuff Size: Normal)   Pulse 71   Temp 98.7 F (37.1 C) (Oral)    ASSESSMENT/INDICATION(S): Dental caries   PROCEDURES: Restorative treatment on #18 OB(V) and #19 MODB. ANESTHESIA: Topical:  Benzocaine 20% applied Type of anesthesia used:  34 mg lidocaine, 0.018 mg epinephrine Location given:  Lower left quadrant infiltration Aspiration negative. COMPOSITE RESTORATIONS:  Cotton roll isolation. Excavated decay from teeth numbers 18 and 19. Teeth numbers 18 OB(V) and 19 MODB were prepared for composite. The extent of caries was into dentin. Etched enamel and dentin surfaces  with 32% phosphoric acid for 15 seconds and rinsed thoroughly. Removed excess water with a brief burst of air. Optibond bonding agent placed and air dried until no movement of bonding agent was seen and then light cured for 10 seconds. OMNICHROMA flowable composite material placed in increments and light-cured. Removed excess material with carbide finishing bur. Occlusion, margins and contacts were verified and adjusted as needed.  Restorations were finished and polished. The patient was advised of possible normal sensitivity to hot and cold for the next few days/weeks.   PLAN:   NEXT VISIT:  Restorative per treatment plan (lower anterior teeth esthetic build-ups using composite)  All questions and concerns were invited and addressed.  The patient tolerated today's visit well and departed in stable condition.  -Sandi Mariscal, DMD

## 2022-05-07 ENCOUNTER — Inpatient Hospital Stay: Payer: 59

## 2022-05-07 ENCOUNTER — Encounter: Payer: Self-pay | Admitting: Hematology and Oncology

## 2022-05-07 ENCOUNTER — Inpatient Hospital Stay: Payer: 59 | Attending: Hematology and Oncology | Admitting: Hematology and Oncology

## 2022-05-07 ENCOUNTER — Other Ambulatory Visit: Payer: Self-pay

## 2022-05-07 VITALS — BP 171/109 | HR 89 | Temp 97.3°F | Resp 15 | Wt 173.1 lb

## 2022-05-07 DIAGNOSIS — Z923 Personal history of irradiation: Secondary | ICD-10-CM | POA: Diagnosis not present

## 2022-05-07 DIAGNOSIS — I10 Essential (primary) hypertension: Secondary | ICD-10-CM | POA: Diagnosis not present

## 2022-05-07 DIAGNOSIS — Z79899 Other long term (current) drug therapy: Secondary | ICD-10-CM | POA: Diagnosis not present

## 2022-05-07 DIAGNOSIS — C113 Malignant neoplasm of anterior wall of nasopharynx: Secondary | ICD-10-CM

## 2022-05-07 DIAGNOSIS — R682 Dry mouth, unspecified: Secondary | ICD-10-CM | POA: Insufficient documentation

## 2022-05-07 DIAGNOSIS — Z9221 Personal history of antineoplastic chemotherapy: Secondary | ICD-10-CM | POA: Diagnosis not present

## 2022-05-07 DIAGNOSIS — I890 Lymphedema, not elsewhere classified: Secondary | ICD-10-CM | POA: Insufficient documentation

## 2022-05-07 LAB — CBC WITH DIFFERENTIAL/PLATELET
Abs Immature Granulocytes: 0.01 10*3/uL (ref 0.00–0.07)
Basophils Absolute: 0 10*3/uL (ref 0.0–0.1)
Basophils Relative: 0 %
Eosinophils Absolute: 0 10*3/uL (ref 0.0–0.5)
Eosinophils Relative: 0 %
HCT: 44.4 % (ref 39.0–52.0)
Hemoglobin: 15.2 g/dL (ref 13.0–17.0)
Immature Granulocytes: 0 %
Lymphocytes Relative: 14 %
Lymphs Abs: 0.6 10*3/uL — ABNORMAL LOW (ref 0.7–4.0)
MCH: 32.3 pg (ref 26.0–34.0)
MCHC: 34.2 g/dL (ref 30.0–36.0)
MCV: 94.3 fL (ref 80.0–100.0)
Monocytes Absolute: 0.3 10*3/uL (ref 0.1–1.0)
Monocytes Relative: 6 %
Neutro Abs: 3.2 10*3/uL (ref 1.7–7.7)
Neutrophils Relative %: 80 %
Platelets: 185 10*3/uL (ref 150–400)
RBC: 4.71 MIL/uL (ref 4.22–5.81)
RDW: 14 % (ref 11.5–15.5)
WBC: 4.1 10*3/uL (ref 4.0–10.5)
nRBC: 0 % (ref 0.0–0.2)

## 2022-05-07 LAB — COMPREHENSIVE METABOLIC PANEL
ALT: 8 U/L (ref 0–44)
AST: 16 U/L (ref 15–41)
Albumin: 4.8 g/dL (ref 3.5–5.0)
Alkaline Phosphatase: 57 U/L (ref 38–126)
Anion gap: 7 (ref 5–15)
BUN: 16 mg/dL (ref 8–23)
CO2: 30 mmol/L (ref 22–32)
Calcium: 9.7 mg/dL (ref 8.9–10.3)
Chloride: 103 mmol/L (ref 98–111)
Creatinine, Ser: 1.27 mg/dL — ABNORMAL HIGH (ref 0.61–1.24)
GFR, Estimated: >60 mL/min (ref >60)
Glucose, Bld: 96 mg/dL (ref 70–99)
Potassium: 3.5 mmol/L (ref 3.5–5.1)
Sodium: 140 mmol/L (ref 135–145)
Total Bilirubin: 0.8 mg/dL (ref 0.3–1.2)
Total Protein: 8.2 g/dL — ABNORMAL HIGH (ref 6.5–8.1)

## 2022-05-07 LAB — TSH: TSH: 1.873 u[IU]/mL (ref 0.350–4.500)

## 2022-05-07 NOTE — Assessment & Plan Note (Signed)
Patient completed CRT with weekly cisplatin, refused adjuvant chemotherapy PET EOT with complete response. He is doing well with no clinical evidence of recurrence. He recently saw Dr Wilburn Cornelia, no concern for recurrence. He has some residual dry mouth but no impact from it. He is able to exercise, maintain his weight and overall feels really well.  He will return to clinic for follow-up in 1 year with [place].  He should continue ENT surveillance every 3 months as recommended.  CBC, CMP and TSH requested today.  He was strongly encouraged to give [place] a call if he needs to see [place] sooner and he expressed understanding.

## 2022-05-07 NOTE — Progress Notes (Signed)
Alan Henry  Patient Care Team: Nolene Ebbs, MD as PCP - General (Internal Medicine) Nolene Ebbs, MD (Internal Medicine) Malmfelt, Stephani Police, RN as Oncology Nurse Navigator Eppie Gibson, MD as Consulting Physician (Radiation Oncology) [name], MD as Consulting Physician (Otolaryngology) Benay Pike, MD as Consulting Physician (Hematology and Oncology)  CHIEF COMPLAINTS/PURPOSE OF VISIT:  Nasopharyngeal cancer, status post chemoradiation therapy  I reviewed the patient's records extensive and collaborated the history with the patient. 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   08/07/2020 Imaging   He had CT soft tissue neck done on August 07, 2020 which showed nasopharyngeal soft tissue prominence, greatest soft tissue effacement of adjacent right parapharyngeal fat suspected to be at least enlarged right retropharyngeal lymph node.  Bulky bilateral cervical midline likely right intraparotid lymphadenopathy   08/30/2020 Pathology Results   Biopsy of right neck lymph node on 08/30/2020 revealed: squamous cell carcinoma, p16 positive. EBV ordered, negative.   09/27/2020 PET scan   1. There is intense FDG uptake within the area of increased soft tissue fullness in the posterior nasopharynx. Cannot exclude primary nasopharyngeal neoplasm. 2. Extensive, bulky bilateral FDG avid cervical adenopathy. Large FDG avid lymph node is also identified within the right parotid gland. Imaging findings compatible with metastatic adenopathy. 3. Subcentimeter right supraclavicular lymph node exhibits mild FDG uptake above background activity.  Equivocal for nodal metastasis. No additional signs of thoracic, abdominal, or pelvic metastasis. No evidence for osseous metastatic disease.    10/04/2020 Cancer Staging   Staging form: Pharynx - Nasopharynx, AJCC 8th Edition - Clinical stage from 10/04/2020: Stage IVA (cT1, cN3, cM0) - Signed by Eppie Gibson, MD on 10/04/2020 Stage prefix: Initial diagnosis   10/11/2020 - 12/13/2020 Chemotherapy   The patient received weekly cisplatin with radiation   10/24/2020 - 12/12/2020 Radiation Therapy   Radiation Treatment Dates: 10/24/2020 through 12/12/2020 Site Technique Total Dose (Gy) Dose per Fx (Gy) Completed Fx Beam Energies  Neck: HN_NasoP IMRT 70/70 2 35/35 6X    03/18/2021 PET scan   Marked interval response to therapy. Near complete resolution of nodal enlargement seen on the previous study and no substantial FDG uptake in the anterior neck.    New area of hypermetabolic activity corresponds to an area in the RIGHT occipital region which shows predominantly fatty density. Given the FDG uptake associated with this location would suggest focused ultrasound with biopsy as warranted for further evaluation. Given the intense nature of the uptake, metastatic disease in this area is strongly considered, CT imaging findings however are atypical for disease.   Small amount of gas within the central parotid gland could even be within the parotid duct. Correlate with any symptoms on this side. No surrounding stranding.   Aortic Atherosclerosis (ICD10-I70.0).     05/01/2021 Procedure   Successful 20 French gastrostomy removal    Interval History  HISTORY OF PRESENTING ILLNESS:   Alan Leech Sr. 66 y.o. male is here for a follow up. He feels well, had a recent visit with Dr Wilburn Cornelia. He recently had a visit with Dr Victorio Palm PA, recent laryngoscopy negative. He is able to eat everything, managing weight. Dry mouth is getting better. No change in breathing. Bowel habits  are normal. No residual  effects from chemotherapy  Rest of the pertinent 10 point ROS reviewed and neg.  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[place])    Pneumonia    Sleep apnea     SURGICAL HISTORY: Past Surgical History:  Procedure Laterality Date   FINE NEEDLE ASPIRATION BIOPSY     HERNIA REPAIR     Umbilicatl hernia   IR GASTROSTOMY TUBE REMOVAL  05/01/2021   IR IMAGING GUIDED PORT INSERTION  10/18/2020   IR REMOVAL TUN ACCESS W/ PORT W/O FL MOD SED  05/01/2021   LAPAROSCOPIC INSERTION GASTROSTOMY TUBE N/A 10/23/2020   Procedure: LAPAROSCOPIC ASSISTED PEG TUBE;  Surgeon: Dwan Bolt, MD;  Location: [facility];  Service: General;  Laterality: N/A;  30   ROTATOR CUFF REPAIR     Torn Labrum      SOCIAL HISTORY: Social History   Socioeconomic History   Marital status: Married    Spouse name: Not on file   Number of children: Not on file   Years of education: Not on file   Highest education level: Not on file  Occupational History   Not on file  Tobacco Use   Smoking status: Never   Smokeless tobacco: Never  Vaping Use   Vaping Use: Never used  Substance and Sexual Activity   Alcohol use: Not Currently    Comment: very rarely   Drug use: No   Sexual activity: Not Currently  Other Topics Concern   Not on file  Social History Narrative   Not on file   Social Determinants of Health   Financial Resource Strain: Not on file  Food Insecurity: No Food Insecurity (10/15/2020)   Hunger Vital Sign    Worried About Running Out of Food in the Last Year: Never true    Ran Out of Food in the Last Year: Never true  Transportation Needs: No Transportation Needs (10/15/2020)   PRAPARE - Hydrologist (Medical): No    Lack of Transportation (Non-Medical): No  Physical Activity: Not on file  Stress: No Stress Concern Present (10/15/2020)   [place]    Feeling of Stress : Not at all  Social Connections: [place] (10/15/2020)   Social Connection and Isolation Panel [NHANES]    Frequency of Communication with Friends and Family: More than three times a week    Frequency of Social Gatherings with Friends and Family: Twice a week    Attends Religious Services: More than 4 times per year    Active Member of [employer] or Organizations: Yes    Attends Archivist Meetings: 1 to 4 times per year    Marital Status: Married  Human resources officer Violence: Not on file    FAMILY HISTORY: No family history on file.  ALLERGIES:  has No Known Allergies.  MEDICATIONS:  Current Outpatient Medications  Medication Sig Dispense Refill   amLODipine (NORVASC) 10 MG tablet Take 10 mg by mouth daily.     atorvastatin (LIPITOR) 80 MG tablet Take 80 mg by mouth daily.     cetirizine (ZYRTEC) 10 MG tablet Take 10 mg by mouth daily.     ergocalciferol (VITAMIN D2) 1.25 MG (50000 UT) capsule Take 50,000 Units by mouth once a week. Wednesday  nitroGLYCERIN (NITROSTAT) 0.4 MG SL tablet Place 0.4 mg under the tongue every 5 (five) minutes x 3 doses as needed for chest pain.     omeprazole (PRILOSEC) 20 MG capsule Take 20 mg by mouth daily.     sildenafil (VIAGRA) 50 MG tablet Take 50-100 mg by mouth daily as needed for erectile dysfunction.     sodium fluoride (SODIUM FLUORIDE 5000 PPM) 1.1 % GEL dental gel Take 1 application by mouth at bedtime. Place 1 drop into each tooth space and leave in mouth for 5 minutes at bedtime.  Do not rinse with water, eat or drink for at least 30 minutes after use. 120 mL 11   No current facility-administered medications for this visit.    REVIEW OF SYSTEMS:   Constitutional: Denies fevers, chills or abnormal night sweats Eyes: Denies blurriness of vision, double vision or watery eyes Ears, nose, mouth, throat, and face: Denies mucositis or sore  throat Respiratory: Denies cough, dyspnea or wheezes Cardiovascular: Denies palpitation, chest discomfort or lower extremity swelling Gastrointestinal:  Denies nausea, heartburn or change in bowel habits Skin: Denies abnormal skin rashes Lymphatics: Denies new lymphadenopathy or easy bruising Neurological:Denies numbness, tingling or new weaknesses Behavioral/Psych: Mood is stable, no new changes  All other systems were reviewed with the patient and are negative.  PHYSICAL EXAMINATION: ECOG PERFORMANCE STATUS: 1 - Symptomatic but completely ambulatory  Vitals:   05/07/22 1156  BP: (!) 171/109  Pulse: 89  Resp: 15  Temp: (!) 97.3 F (36.3 C)  SpO2: 98%    Filed Weights   05/07/22 1156  Weight: 173 lb 1.6 oz (78.5 kg)     Physical Exam Constitutional:      Appearance: Normal appearance.  Cardiovascular:     Rate and Rhythm: Normal rate and regular rhythm.     Pulses: Normal pulses.     Heart sounds: Normal heart sounds.  Pulmonary:     Effort: Pulmonary effort is normal.     Breath sounds: Normal breath sounds.  Musculoskeletal:        General: No swelling or tenderness.     Cervical back: Normal range of motion and neck supple. No rigidity.  Lymphadenopathy:     Cervical: No cervical adenopathy.  Skin:    General: Skin is warm and dry.  Neurological:     General: No focal deficit present.     Mental Status: He is alert.  Psychiatric:        Mood and Affect: Mood normal.      LABORATORY DATA:  I have reviewed the data as listed Lab Results  Component Value Date   WBC 4.1 05/07/2022   HGB 15.2 05/07/2022   HCT 44.4 05/07/2022   MCV 94.3 05/07/2022   PLT 185 05/07/2022   Recent Labs    05/08/21 0934 10/22/21 0915  NA 143 139  K 3.7 3.9  CL 105 101  CO2 29 30  GLUCOSE 95 131*  BUN 22 20  CREATININE 1.18 1.30*  CALCIUM 9.6 9.8  GFRNONAA >60 >60  PROT 7.7 7.8  ALBUMIN 4.3 4.6  AST 16 16  ALT 11 14  ALKPHOS 70 66  BILITOT 0.5 0.6     RADIOGRAPHIC STUDIES: I have personally reviewed the radiological images as listed and agreed with the findings in the report. No results found.  ASSESSMENT & PLAN:   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[facility]) Patient completed CRT with weekly cisplatin, refused adjuvant chemotherapy PET EOT with complete response. He is  doing well with no clinical evidence of recurrence. He recently saw Dr Wilburn Cornelia, no concern for recurrence. He has some residual dry mouth but no impact from it. He is able to exercise, maintain his weight and overall feels really well.  He will return to clinic for follow-up in 1 year with [place].  He should continue ENT surveillance every 3 months as recommended.  CBC, CMP and TSH requested today.  He was strongly encouraged to give [place] a call if he needs to see [place] sooner and he expressed understanding.   Orders Placed This Encounter  Procedures   CBC with Differential/Platelet    Standing Status:   Standing    Number of Occurrences:   22    Standing Expiration Date:   05/08/2023   TSH    Standing Status:   Standing    Number of Occurrences:   22    Standing Expiration Date:   05/08/2023   Comprehensive metabolic panel    Standing Status:   Standing    Number of Occurrences:   33    Standing Expiration Date:   05/08/2023    All questions were answered. The patient knows to call the clinic with any problems, questions or concerns. Total time spent: 30 minutes including history, physical exam, review of records, counseling and coordination of care Benay Pike, MD 05/07/2022 1:03 PM

## 2022-05-18 IMAGING — US IR IMAGING GUIDED PORT INSERTION
1 series · 3 of 3 positions shown · non-contrast
Comparison: none

INDICATION: 64-year-old male with head and neck cancer and locoregional
metastatic lymphadenopathy. He presents for port catheter placement
for durable venous access.

[Series 1: ir imaging guided port insertion · 3 of 3 slices shown]
[im 1/3]
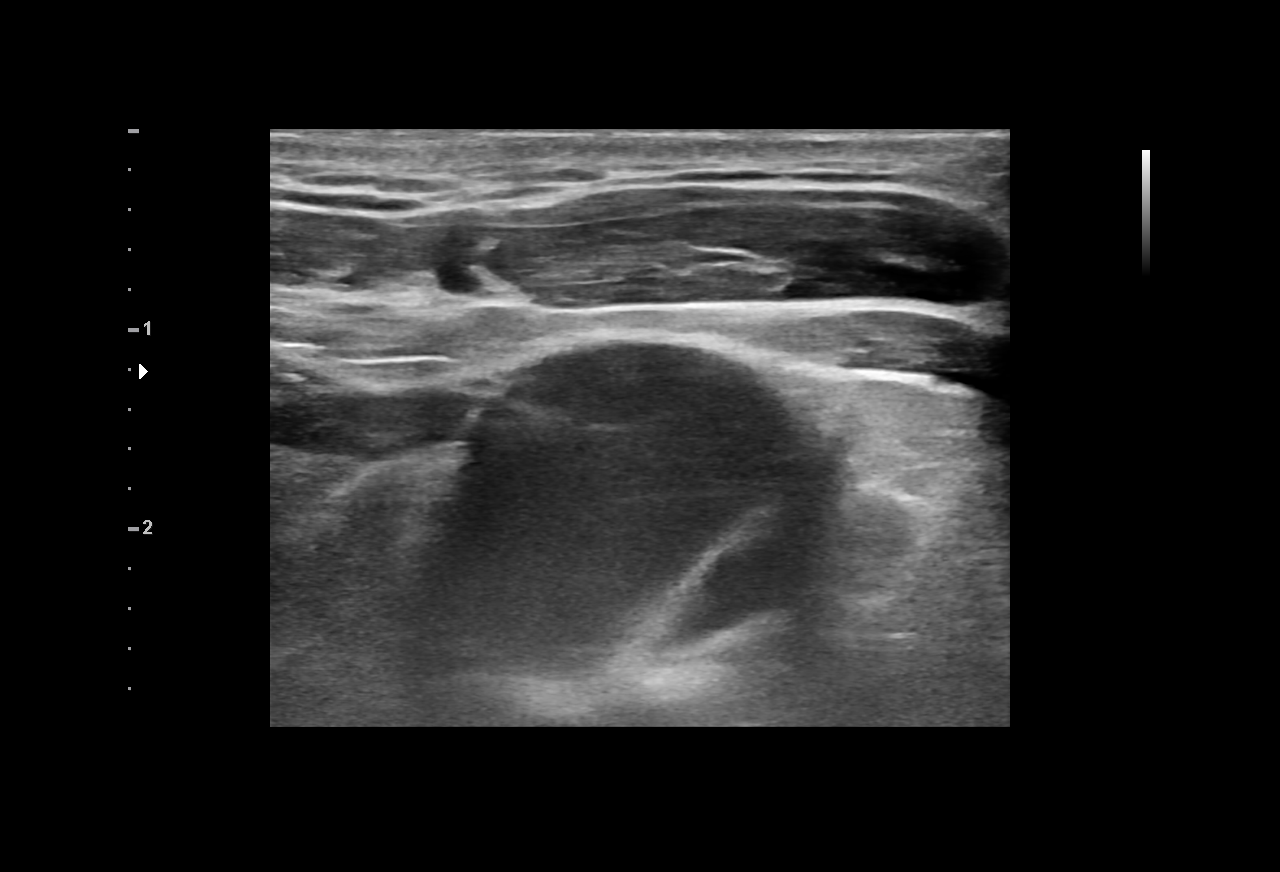
[im 2/3]
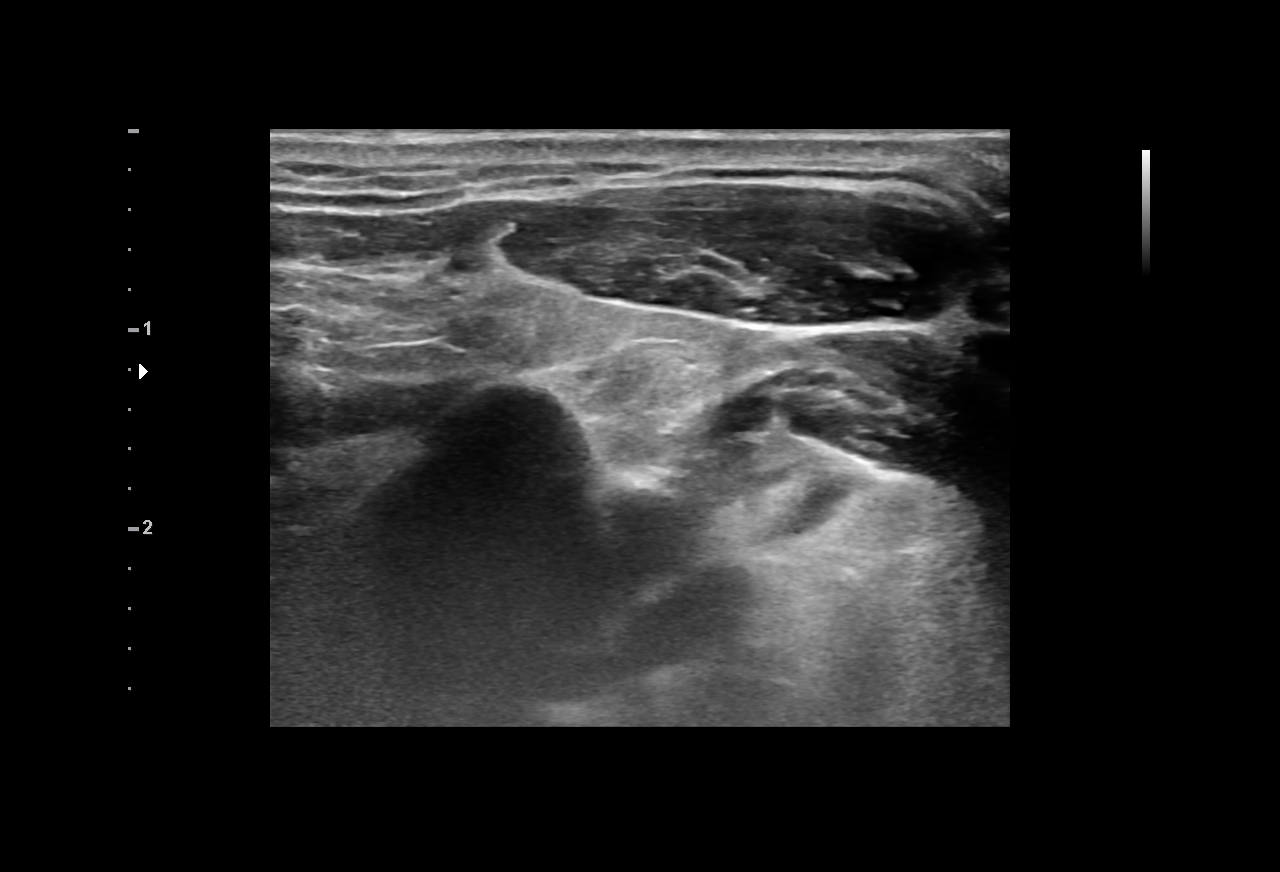
[im 3/3]
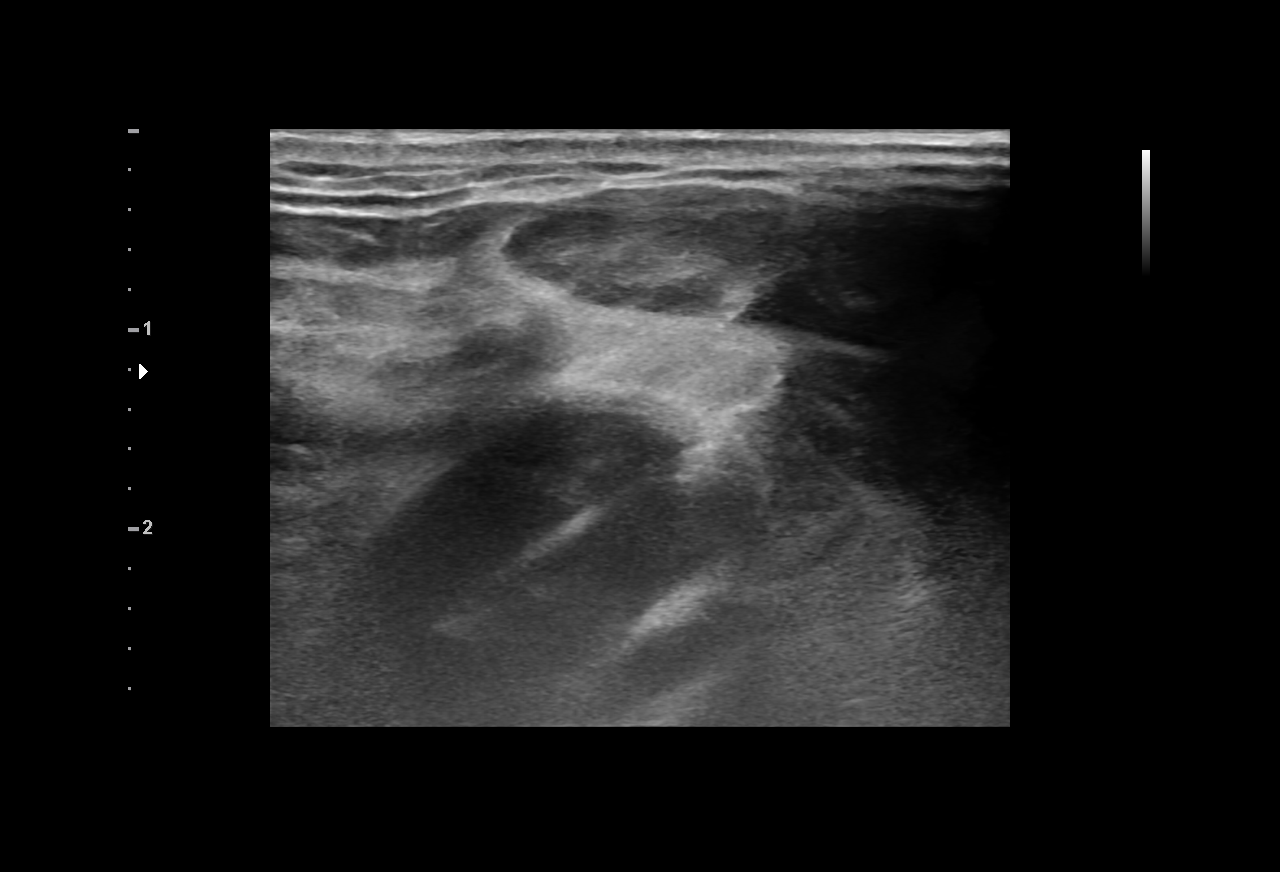

[3 of 3 positions shown; findings below may reference images not displayed]

EXAM:
IMPLANTED PORT A CATH PLACEMENT WITH ULTRASOUND AND FLUOROSCOPIC
GUIDANCE

MEDICATIONS:
None

ANESTHESIA/SEDATION:
Versed 1 mg IV; Fentanyl 50 mcg IV;

Moderate Sedation Time:  23 minutes

The patient was continuously monitored during the procedure by the
interventional radiology nurse under my direct supervision.

FLUOROSCOPY TIME:  0 minutes, 48 seconds (3 mGy)

COMPLICATIONS:
None immediate.

PROCEDURE:
The right neck and chest was prepped with chlorhexidine, and draped
in the usual sterile fashion using maximum barrier technique (cap
and mask, sterile gown, sterile gloves, large sterile sheet, hand
hygiene and cutaneous antiseptic). Local anesthesia was attained by
infiltration with 1% lidocaine with epinephrine.

Ultrasound demonstrated patency of the left internal jugular vein
vein, and this was documented with an image. Under real-time
ultrasound guidance, this vein was accessed with a 21 gauge
micropuncture needle and image documentation was performed. A small
dermatotomy was made at the access site with an 11 scalpel. A 0.018"
wire was advanced into the SVC and the access needle exchanged for a
4F micropuncture vascular sheath. The 0.018" wire was then removed
and a 0.035" wire advanced into the IVC.



The venous access site was then serially dilated and a peel away
vascular sheath placed over the wire. The wire was removed and the
port catheter advanced into position under fluoroscopic guidance.
The catheter tip is positioned in the superior cavoatrial junction.
This was documented with a spot image. The portacatheter was then
tested and found to flush and aspirate well. The port was flushed
with saline followed by 100 units/mL heparinized saline.

The pocket was then closed in two layers using first subdermal
inverted interrupted absorbable sutures followed by a running
subcuticular suture. The epidermis was then sealed with Dermabond.
The dermatotomy at the venous access site was also closed with
Dermabond.
IMPRESSION: Successful placement of a left IJ approach Power Port with
ultrasound and fluoroscopic guidance. The catheter is ready for use.

## 2022-06-03 ENCOUNTER — Encounter (HOSPITAL_COMMUNITY): Payer: 59 | Admitting: Dentistry

## 2022-06-09 ENCOUNTER — Ambulatory Visit (INDEPENDENT_AMBULATORY_CARE_PROVIDER_SITE_OTHER): Payer: Dental | Admitting: Dentistry

## 2022-06-09 ENCOUNTER — Encounter (HOSPITAL_COMMUNITY): Payer: Self-pay | Admitting: Dentistry

## 2022-06-09 VITALS — BP 138/89 | HR 76 | Temp 98.5°F

## 2022-06-09 DIAGNOSIS — K030 Excessive attrition of teeth: Secondary | ICD-10-CM

## 2022-06-09 NOTE — Progress Notes (Signed)
[facility] Department of Dental Medicine      TODAY'S VISIT    RESTORATIVE   PROCEDURES: Restorative/build-ups on #23 & #24 PLAN:  NEXT VISIT:  6 month recall    Service Date:   06/09/2022  Patient Name:   Alan LAVENTURE Sr. Date of Birth:   01/15/1956 Medical Record Number: [id number]   HISTORY OF PRESENT ILLNESS: Alan CIAVARELLA Sr. is a 66 y.o. male who presents today for restorative treatment on teeth numbers 23 and 24 per treatment plan. Medical and dental history reviewed with the patient.  No changes were reported.   CHIEF COMPLAINT:  Here for a routine dental appointment; patient with no complaints.   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   Chronic periodontitis 05/20/2021   Incipient enamel caries 05/20/2021   Abfraction 05/20/2021   Acquired lymphedema 05/08/2021   Abnormal posture 05/08/2021   Leukopenia due to antineoplastic chemotherapy ([place]) 11/27/2020   Chemotherapy induced nausea and vomiting 11/06/2020   Weight loss, unintentional 11/06/2020   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   Essential hypertension 11/14/2019   Hyperlipidemia 11/14/2019   Family history of heart disease 11/14/2019   Chest pain of uncertain etiology 68/34/1962   Nonspecific abnormal electrocardiogram (ECG) (EKG) 11/14/2019   Past Medical History:  Diagnosis Date   Chest pain    2021   History of kidney stones    Hypertension    Nasopharyngeal cancer  ([place])    Pneumonia    Sleep apnea    Past Surgical History:  Procedure Laterality Date   FINE NEEDLE ASPIRATION BIOPSY     HERNIA REPAIR     Umbilicatl hernia   IR GASTROSTOMY TUBE REMOVAL  05/01/2021   IR IMAGING GUIDED PORT INSERTION  10/18/2020   IR REMOVAL TUN ACCESS W/ PORT W/O FL MOD SED  05/01/2021   LAPAROSCOPIC INSERTION GASTROSTOMY TUBE N/A 10/23/2020   Procedure: LAPAROSCOPIC ASSISTED PEG TUBE;  Surgeon: Dwan Bolt, MD;  Location: [facility];  Service: General;  Laterality: N/A;  60   ROTATOR CUFF REPAIR     Torn Labrum     No Known Allergies Current Outpatient Medications  Medication Sig Dispense Refill   amLODipine (NORVASC) 10 MG tablet Take 10 mg by mouth daily.     atorvastatin (LIPITOR) 80 MG tablet Take 80 mg by mouth daily.     cetirizine (ZYRTEC) 10 MG tablet Take 10 mg by mouth daily.     ergocalciferol (VITAMIN D2) 1.25 MG (50000 UT) capsule Take 50,000 Units by mouth once a week. Wednesday     nitroGLYCERIN (NITROSTAT) 0.4 MG SL tablet Place 0.4 mg under the tongue every 5 (five) minutes x 3 doses as needed for chest pain.     omeprazole (PRILOSEC) 20 MG capsule Take 20 mg by mouth daily.     sildenafil (VIAGRA) 50 MG tablet Take 50-100 mg by mouth daily as needed for erectile dysfunction.     sodium fluoride (SODIUM FLUORIDE 5000 PPM) 1.1 % GEL dental  gel Take 1 application by mouth at bedtime. Place 1 drop into each tooth space and leave in mouth for 5 minutes at bedtime.  Do not rinse with water, eat or drink for at least 30 minutes after use. 120 mL 11   No current facility-administered medications for this visit.    LABS: Lab Results  Component Value Date   WBC 4.1 05/07/2022   HGB 15.2 05/07/2022   HCT 44.4 05/07/2022   MCV 94.3 05/07/2022   PLT 185 05/07/2022      Component Value Date/Time   NA 140 05/07/2022 1222   K 3.5 05/07/2022 1222   CL 103 05/07/2022 1222   CO2 30 05/07/2022 1222   GLUCOSE 96 05/07/2022 1222   BUN 16 05/07/2022 1222    CREATININE 1.27 (H) 05/07/2022 1222   CREATININE 1.30 (H) 10/22/2021 0915   CALCIUM 9.7 05/07/2022 1222   GFRNONAA >60 05/07/2022 1222   GFRNONAA >60 10/22/2021 0915   GFRAA 34 (L) 11/20/2015 1436   No results found for: "INR", "PROTIME" No results found for: "PTT"  Social History   Socioeconomic History   Marital status: Married    Spouse name: Not on file   Number of children: Not on file   Years of education: Not on file   Highest education level: Not on file  Occupational History   Not on file  Tobacco Use   Smoking status: Never   Smokeless tobacco: Never  Vaping Use   Vaping Use: Never used  Substance and Sexual Activity   Alcohol use: Not Currently    Comment: very rarely   Drug use: No   Sexual activity: Not Currently  Other Topics Concern   Not on file  Social History Narrative   Not on file   Social Determinants of Health   Financial Resource Strain: Not on file  Food Insecurity: No Food Insecurity (10/15/2020)   Hunger Vital Sign    Worried About Running Out of Food in the Last Year: Never true    Ran Out of Food in the Last Year: Never true  Transportation Needs: No Transportation Needs (10/15/2020)   PRAPARE - Hydrologist (Medical): No    Lack of Transportation (Non-Medical): No  Physical Activity: Not on file  Stress: No Stress Concern Present (10/15/2020)   [place]    Feeling of Stress : Not at all  Social Connections: [place] (10/15/2020)   Social Connection and Isolation Panel [NHANES]    Frequency of Communication with Friends and Family: More than three times a week    Frequency of Social Gatherings with Friends and Family: Twice a week    Attends Religious Services: More than 4 times per year    Active Member of [employer] or Organizations: Yes    Attends Archivist Meetings: 1 to 4 times per year    Marital Status: Married   Human resources officer Violence: Not on file   No family history on file.   ANTIBIOTIC PROPHYLAXIS/OTHER PREMEDICATION: [N/A]   VITAL SIGNS: BP 138/89 (BP Location: Right Arm, Patient Position: Sitting, Cuff Size: Normal)   Pulse 76   Temp 98.5 F (36.9 C) (Oral)    ASSESSMENT/INDICATION(S): Esthetic restorations due to attrition/wear and teeth shifting   PROCEDURES: Restorative treatment on teeth #23 MILF and #24 MIDLF. ANESTHESIA: Topical:  Benzocaine 20% applied Type of anesthesia used:  34 mg lidocaine, 0.018 mg epinephrine Location given:  #23  and #24 infiltration Aspiration negative. COMPOSITE RESTORATIONS: Cotton roll isolation. Teeth numbers 23 and 24 were prepared for composite/core build-up. Etched enamel and dentin surfaces  with 32% phosphoric acid for 15 seconds and rinsed thoroughly. Removed excess water with a brief burst of air. Optibond bonding agent placed and air dried until no movement of bonding agent was seen. Repeated and then light cured for 10 seconds. OMNICHROMA flowable composite material placed in increments and light-cured. Removed excess material with carbide finishing bur. Occlusion, margins and contact verified and adjusted as needed.  Restorations finished and polished. The patient was advised of possible normal sensitivity to hot and cold for the next few days/weeks. The patient reported overall satisfaction with restorations.   PLAN:   NEXT VISIT:  6 month recall  All questions and concerns were invited and addressed.  The patient tolerated today's visit well and departed in stable condition.  -Sandi Mariscal, DMD

## 2022-06-24 ENCOUNTER — Ambulatory Visit (HOSPITAL_COMMUNITY): Payer: Dental | Admitting: Dentistry

## 2022-06-24 ENCOUNTER — Encounter (HOSPITAL_COMMUNITY): Payer: Self-pay | Admitting: Dentistry

## 2022-06-24 VITALS — BP 156/86 | HR 76 | Temp 98.0°F

## 2022-06-24 DIAGNOSIS — Z0120 Encounter for dental examination and cleaning without abnormal findings: Secondary | ICD-10-CM

## 2022-06-24 DIAGNOSIS — Z923 Personal history of irradiation: Secondary | ICD-10-CM

## 2022-06-24 MED ORDER — SODIUM FLUORIDE 1.1 % DT PSTE: PASTE | DENTAL | 11 refills | Status: DC

## 2022-06-24 NOTE — Progress Notes (Unsigned)
[place] Department of Dental Medicine     TODAY'S VISIT:   6 MONTH RECALL   PROCEDURES: Adult prophylaxis, updated radiographs & periodic exam ASSESSMENT: Soft tissue:  Xerostomia  Caries risk:  HIGH Periodontal impression:  *** Other findings:  *** PLAN/RECOMMENDATIONS: *** Call if any questions or concerns arise before next visit.  Rx:  Sodium fluoride 1.1% paste to use with fluoride trays  NEXT VISIT:  6 month recall ***    Service Date:   06/24/2022  Patient Name:   Alan SHEELEY Sr. Date of Birth:   05/05/1956 Medical Record Number: [id number]   HISTORY OF PRESENT ILLNESS: Alan NORWOOD Sr. is a very pleasant 66 y.o. male with history of hypertension, sleep apnea and nasopharyngeal cancer status-post chemoradiation therapy (completed in 12/2020) who is a patient of record at the hospital dental clinic.  The patient presents today for a periodic oral exam, updated radiographs and a cleaning.   Medical and dental history were reviewed with the patient; no changes were reported.  The patient's last recall visit was on 12/17/2021.   CHIEF COMPLAINT: Here for a routine recall visit;  patient with no complaints.   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   Chronic periodontitis 05/20/2021   Incipient enamel caries 05/20/2021   Abfraction 05/20/2021   Acquired lymphedema 05/08/2021   Abnormal posture 05/08/2021   Leukopenia due to antineoplastic chemotherapy  ([place]) 11/27/2020   Chemotherapy induced nausea and vomiting 11/06/2020   Weight loss, unintentional 11/06/2020   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   Essential hypertension 11/14/2019   Hyperlipidemia 11/14/2019   Family history of heart disease 11/14/2019   Chest pain of uncertain etiology 23/55/7322   Nonspecific abnormal electrocardiogram (ECG) (EKG) 11/14/2019   Past Medical History:  Diagnosis Date   Chest pain    2021   History of kidney stones    Hypertension    Nasopharyngeal cancer ([place])    Pneumonia    Sleep apnea    Past Surgical History:  Procedure Laterality Date   FINE NEEDLE ASPIRATION BIOPSY     HERNIA REPAIR     Umbilicatl hernia   IR GASTROSTOMY TUBE REMOVAL  05/01/2021   IR IMAGING GUIDED PORT INSERTION  10/18/2020   IR REMOVAL TUN ACCESS W/ PORT W/O FL MOD SED  05/01/2021   LAPAROSCOPIC INSERTION GASTROSTOMY TUBE N/A 10/23/2020   Procedure: LAPAROSCOPIC ASSISTED PEG TUBE;  Surgeon: Dwan Bolt, MD;  Location: [facility];  Service: General;  Laterality: N/A;  60   ROTATOR CUFF REPAIR     Torn Labrum     No Known Allergies Current Outpatient Medications  Medication Sig Dispense Refill   amLODipine (NORVASC) 10 MG tablet Take 10 mg by mouth daily.     atorvastatin (LIPITOR) 80 MG tablet Take 80 mg by mouth daily.     cetirizine (ZYRTEC) 10 MG tablet Take 10 mg by mouth daily.     ergocalciferol (VITAMIN D2) 1.25 MG (50000 UT) capsule Take 50,000 Units  by mouth once a week. Wednesday     nitroGLYCERIN (NITROSTAT) 0.4 MG SL tablet Place 0.4 mg under the tongue every 5 (five) minutes x 3 doses as needed for chest pain.     omeprazole (PRILOSEC) 20 MG capsule Take 20 mg by mouth daily.     sildenafil (VIAGRA) 50 MG tablet Take 50-100 mg by mouth daily as needed for erectile dysfunction.     sodium fluoride (SODIUM FLUORIDE 5000 PPM) 1.1 % GEL dental gel Take 1 application by mouth at bedtime. Place 1 drop into each tooth space and leave in  mouth for 5 minutes at bedtime.  Do not rinse with water, eat or drink for at least 30 minutes after use. 120 mL 11   No current facility-administered medications for this visit.    LABS: Lab Results  Component Value Date   WBC 4.1 05/07/2022   HGB 15.2 05/07/2022   HCT 44.4 05/07/2022   MCV 94.3 05/07/2022   PLT 185 05/07/2022      Component Value Date/Time   NA 140 05/07/2022 1222   K 3.5 05/07/2022 1222   CL 103 05/07/2022 1222   CO2 30 05/07/2022 1222   GLUCOSE 96 05/07/2022 1222   BUN 16 05/07/2022 1222   CREATININE 1.27 (H) 05/07/2022 1222   CREATININE 1.30 (H) 10/22/2021 0915   CALCIUM 9.7 05/07/2022 1222   GFRNONAA >60 05/07/2022 1222   GFRNONAA >60 10/22/2021 0915   GFRAA 34 (L) 11/20/2015 1436   No results found for: "INR", "PROTIME" No results found for: "PTT"  Social History   Socioeconomic History   Marital status: Married    Spouse name: Not on file   Number of children: Not on file   Years of education: Not on file   Highest education level: Not on file  Occupational History   Not on file  Tobacco Use   Smoking status: Never   Smokeless tobacco: Never  Vaping Use   Vaping Use: Never used  Substance and Sexual Activity   Alcohol use: Not Currently    Comment: very rarely   Drug use: No   Sexual activity: Not Currently  Other Topics Concern   Not on file  Social History Narrative   Not on file   Social Determinants of Health   Financial Resource Strain: Not on file  Food Insecurity: No Food Insecurity (10/15/2020)   Hunger Vital Sign    Worried About Running Out of Food in the Last Year: Never true    Ran Out of Food in the Last Year: Never true  Transportation Needs: No Transportation Needs (10/15/2020)   PRAPARE - Hydrologist (Medical): No    Lack of Transportation (Non-Medical): No  Physical Activity: Not on file  Stress: No Stress Concern Present (10/15/2020)   [place]    Feeling of Stress : Not at all  Social Connections: [place] (10/15/2020)   Social Connection and Isolation Panel [NHANES]    Frequency of Communication with Friends and Family: More than three times a week    Frequency of Social Gatherings with Friends and Family: Twice a week    Attends Religious Services: More than 4 times per year    Active Member of [employer] or Organizations: Yes    Attends Archivist Meetings: 1 to 4 times per year    Marital Status: Married  Human resources officer Violence: Not on file   History  reviewed. No pertinent family history.   REVIEW OF SYSTEMS:  Reviewed with the patient as per HPI. PSYCH: Patient denies having dental phobia.   VITAL SIGNS: BP (!) 156/86 (BP Location: Right Arm, Patient Position: Sitting, Cuff Size: Normal)   Pulse 76   Temp 98 F (36.7 C) (Oral)    PHYSICAL EXAM:  Soft tissue exam completed and all updates were charted.  GENERAL:  Well-developed, comfortable and in no apparent distress. NEUROLOGICAL:  Alert and oriented to person, place and  time. EXTRAORAL:  Head size, head shape, facial symmetry, skin, complexion, pigmentation, conjunctiva, oral labia, parotid gland, submandibular gland, thyroid, cervical and preauricular lymph nodes, submandibular and submental lymph nodes, tonsillar and occipital lymph nodes and supraclavicular lymph nodes WNL. No swelling or lymphadenopathy.  TMJ asymptomatic without clicks or crepitations. MAXIMUM INTERINCISAL OPENING:  Unchanged since post-radiation dental visit on 03/21/21 (42 mm); he continues to report having no side effects or concerns related to his jaws and/or limited opening. INTRAORAL:  Soft tissues appear well-perfused.  FOM and vestibules soft and not raised. Oral cavity without mass or lesion. No signs of infection, parulis, sinus tract, edema or erythema evident upon exam.  [+] Bilateral mandibular tori. [+] Mucous membranes:   Clinically dry; thick, viscous saliva. Patient continues to take multiple sips of water a day as needed to help alleviate dry mouth.  DENTAL EXAM:  Hard tissue exam was completed and all updates were charted.     OVERALL IMPRESSION:  Good remaining dentition.       ORAL HYGIENE:  Poor   HARD TISSUE:  MISSING:  #16, #31  CARIES:  Questionable prognosis:  ***   Restorable:  ***   Incipient lesions:  ***  DEFECTIVE RESTORATIONS:  ***  ENDODONTICS:  #8 previous RCT  FIXED PROSTHODONTICS:  #8 existing PFM crown OCCLUSION:  Class 1   Non-functional teeth:  #2, #17  Other:  #32 mesially inclined OTHER FINDINGS:   [+] Attrition/wear:  *** [+] Abfraction/flexure:  ***  PERIODONTAL: PLAQUE:  Slight, heavy *** CALCULUS:  *** accumulation GINGIVAL APPEARANCE:  Pink, healthy gingival tissue with blunted papilla.  ***  Exam was performed by Sandi Mariscal, DMD with Leta Speller, DAII acting as scribe.   RADIOGRAPHIC EXAM:  Updated radiographs included 4 Bitewings that were exposed and interpreted.   Localized mild *** horizontal bone loss consistent with *** .  Missing teeth ***   ASSESSMENT:  Periodic dental exam and cleaning History of head and neck radiation therapy *** ***  *** *** *** *** ***  ***   ANTIBIOTIC PROPHYLAXIS/OTHER PREMEDICATION:[N/A ]   PROCEDURES: Adult prophylaxis. Removed all supra- and subgingival *** calculus and plaque using Cavitron and hand instruments. Polished all teeth.  Flossed. Oral hygiene instruction discussed with the patient.  Encouraged better *** . Toothbrush, toothpaste and floss given to the patient.   PLAN AND RECOMMENDATIONS: I explained all significant findings of the dental exam with the patient including *** and the recommended care including *** .  The patient verbalized understanding of all findings, discussion, and recommendations.  Continue with 6 month recall interval. *** Call if questions or concerns arise  before next visit.  Rx: 1.1% sodium fluoride paste to use with fluoride trays   NEXT VISIT:  6 month recall ***  All questions and concerns were invited and addressed.  The patient tolerated today's visit well and departed in stable condition.  -Sandi Mariscal, DMD

## 2022-07-10 ENCOUNTER — Telehealth: Payer: Self-pay | Admitting: *Deleted

## 2022-07-10 ENCOUNTER — Encounter: Payer: Self-pay | Admitting: Gastroenterology

## 2022-07-10 NOTE — Telephone Encounter (Signed)
Called patient to inform of Ct for 07-23-22- arrival time- 10:15 am @ [facility] Rad[facility], patient to have water only- 4 hrs. prior to test, patient to receive results from Dr. Isidore Moos on 07-29-22 @ 11 am, spoke with patient and he is aware of these appts. and the instructions

## 2022-07-17 NOTE — Progress Notes (Signed)
[name] presents today for follow-up after completing radiation to his nasopharynx on 12/12/2020 and to review CT scan results from 07/27/2022  Pain issues, if any: Denies Using a feeding tube?: N/A Weight changes, if any:  Wt Readings from Last 3 Encounters:  07/29/22 176 lb 2 oz (79.9 kg)  05/07/22 173 lb 1.6 oz (78.5 kg)  10/22/21 174 lb 3.2 oz (79 kg)   Swallowing issues, if any: Denies. Reports he can eat/drink a wide variety Smoking or chewing tobacco? None Using fluoride trays daily? Last saw Dr. Sandi Mariscal on 06/09/2022. Denies any new dental issues/concerns Last ENT visit was on: 04/30/2022 Saw Louisa Norbladh/Dr. Izora Gala  --Procedures:  Flexible Laryngoscopy Procedure  Flexible laryngoscopy shows patent anterior nasal cavity with minimal crusting, no discharge or infection. Posterior nasopharynx with evidence of mucositis secondary to radiation therapy; mild erythema and crusting. No mass, lesion or ulceration. Normal base of tongue and supraglottis Normal vocal cord mobility without vocal cord nodule, mass, polyp or tumor. Hypopharynx normal without mass, pooling of secretions or aspiration.  --Impression & Plans:  Alan Henry is a 67 y.o. male here for recheck. History of nasopharyngeal cancer with completion of CRT, June 2022. Negative PET scan from January 2023. Reassured patient that exam today shows no recurrence of disease. No cervical lymphadenopathy. Findings of nasopharyngeal mucositis; recommend increasing use of saline nasal spray. Follow up with Oncology as planned next week.  Recommend follow up in 3 months with ENT for repeat exam, sooner with any new concerns.  Patient agrees with the plan.   Other notable issues, if any: Main concern is continued dry mouth. Denies any lingering fatigue, new swelling to his neck, or other concerns. Overall reports he feels good and is doing well

## 2022-07-22 ENCOUNTER — Other Ambulatory Visit: Payer: Self-pay

## 2022-07-22 DIAGNOSIS — C113 Malignant neoplasm of anterior wall of nasopharynx: Secondary | ICD-10-CM

## 2022-07-23 ENCOUNTER — Ambulatory Visit (HOSPITAL_COMMUNITY): Payer: 59

## 2022-07-27 ENCOUNTER — Ambulatory Visit (HOSPITAL_COMMUNITY)
Admission: RE | Admit: 2022-07-27 | Discharge: 2022-07-27 | Disposition: A | Discharge status: Home / Self Care | Payer: 59 | Source: Ambulatory Visit | Attending: Radiation Oncology | Admitting: Radiation Oncology

## 2022-07-27 DIAGNOSIS — C113 Malignant neoplasm of anterior wall of nasopharynx: Secondary | ICD-10-CM | POA: Diagnosis not present

## 2022-07-27 MED ORDER — IOHEXOL 300 MG/ML  SOLN
100.0000 mL | Freq: Once | INTRAMUSCULAR | Status: AC | PRN
  Administered 2022-07-27: 100 mL via INTRAVENOUS

## 2022-07-29 ENCOUNTER — Telehealth: Payer: Self-pay | Admitting: *Deleted

## 2022-07-29 ENCOUNTER — Ambulatory Visit
Admission: RE | Admit: 2022-07-29 | Discharge: 2022-07-29 | Disposition: A | Discharge status: Home / Self Care | Payer: 59 | Source: Ambulatory Visit | Attending: Radiation Oncology | Admitting: Radiation Oncology

## 2022-07-29 ENCOUNTER — Other Ambulatory Visit: Payer: Self-pay

## 2022-07-29 VITALS — BP 149/96 | HR 60 | Temp 96.3°F | Resp 18 | Ht 69.5 in | Wt 176.1 lb

## 2022-07-29 DIAGNOSIS — I700 Atherosclerosis of aorta: Secondary | ICD-10-CM | POA: Diagnosis not present

## 2022-07-29 DIAGNOSIS — R682 Dry mouth, unspecified: Secondary | ICD-10-CM | POA: Diagnosis not present

## 2022-07-29 DIAGNOSIS — Z79899 Other long term (current) drug therapy: Secondary | ICD-10-CM | POA: Insufficient documentation

## 2022-07-29 DIAGNOSIS — C113 Malignant neoplasm of anterior wall of nasopharynx: Secondary | ICD-10-CM

## 2022-07-29 DIAGNOSIS — I10 Essential (primary) hypertension: Secondary | ICD-10-CM

## 2022-07-29 DIAGNOSIS — Z923 Personal history of irradiation: Secondary | ICD-10-CM | POA: Insufficient documentation

## 2022-07-29 DIAGNOSIS — I2510 Atherosclerotic heart disease of native coronary artery without angina pectoris: Secondary | ICD-10-CM | POA: Diagnosis not present

## 2022-07-29 DIAGNOSIS — C118 Malignant neoplasm of overlapping sites of nasopharynx: Secondary | ICD-10-CM | POA: Diagnosis not present

## 2022-07-29 NOTE — Telephone Encounter (Signed)
Called patient to inform of appt. with Cardiologist Camelia Eng on 08-11-22- arrival time- 9:15 am , address- [address], phone number - [phone], spoke with patient and he is aware of this appt.

## 2022-07-29 NOTE — Progress Notes (Signed)
Oncology Nurse Navigator Documentation   I met with Alan Henry before his scheduled follow up with Dr. Isidore Moos today. He is doing well and anxious about his recent CT results. He is recovering well from his treatment. He does report a dry mouth and I provided him with a mouth rinse and spray to use. He also expressed interest in becoming a mentor for patients undergoing treatment for head and neck cancer and I have sent a message to Linda Hedges to contact him. I also provided him with a flyer discussing the head and neck cancer support group that meets monthly.   Harlow Asa RN, BSN, OCN Head & Neck Oncology Nurse [place] [facility] Phone # [phone]  Fax # [phone]

## 2022-07-31 ENCOUNTER — Encounter: Payer: Self-pay | Admitting: Radiation Oncology

## 2022-07-31 NOTE — Progress Notes (Signed)
Radiation Oncology         [phone]) [phone] ________________________________  Name: Alan Leech Sr. MRN: [id number]  Date: 07/29/2022  DOB: 04/06/1956  Follow-Up Visit Note  CC: Nolene Ebbs, MD  [name], MD  Diagnosis and Prior Radiotherapy:    C11.3   ICD-10-CM   1. 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 CT Chest W Contrast    CT Soft Tissue Neck W Contrast     Cancer Staging  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HCC) Staging form: Pharynx - Nasopharynx, AJCC 8th Edition - Clinical stage from 10/04/2020: Stage IVA (cT1, cN3, cM0) - Signed by Eppie Gibson, MD on 10/04/2020 Stage prefix: Initial diagnosis    CHIEF COMPLAINT:  Here for follow-up and surveillance of nasopharyngeal cancer  Narrative:   Alan Henry presents today for follow-up after completing radiation to his nasopharynx on 12/12/2020 and to review CT scan results from 07/27/2022  Pain issues, if any: Denies Using a feeding tube?: N/A Weight changes, if any:  Wt Readings from Last 3 Encounters:  07/29/22 176 lb 2 oz (79.9 kg)  05/07/22 173 lb 1.6 oz (78.5 kg)  10/22/21 174 lb 3.2 oz (79 kg)   Swallowing issues, if any: Denies. Reports he can eat/drink a wide variety Smoking or chewing tobacco? None Using fluoride trays daily? Last saw Dr. Sandi Mariscal on 06/09/2022. Denies any new dental issues/concerns Last ENT visit was on: 04/30/2022 Saw Louisa Norbladh/Dr. Izora Gala  --Procedures:  Flexible Laryngoscopy Procedure  Flexible laryngoscopy shows patent anterior nasal cavity with minimal crusting, no discharge or infection. Posterior nasopharynx with evidence of mucositis secondary to radiation therapy; mild erythema and crusting. No mass, lesion or ulceration. Normal base of tongue and supraglottis Normal vocal cord mobility without vocal cord nodule, mass, polyp or tumor. Hypopharynx normal without mass, pooling of secretions or aspiration.  --Impression & Plans:  Alan Henry is a 67 y.o.  male here for recheck. History of nasopharyngeal cancer with completion of CRT, June 2022. Negative PET scan from January 2023. Reassured patient that exam today shows no recurrence of disease. No cervical lymphadenopathy. Findings of nasopharyngeal mucositis; recommend increasing use of saline nasal spray. Follow up with Oncology as planned next week.  Recommend follow up in 3 months with ENT for repeat exam, sooner with any new concerns.  Patient agrees with the plan.   Other notable issues, if any: Main concern is continued dry mouth. Denies any lingering fatigue, new swelling to his neck, or other concerns. Overall reports he feels good and is doing well   Meds: Current Outpatient Medications  Medication Sig Dispense Refill   metoprolol succinate (TOPROL-XL) 25 MG 24 hr tablet Take 12.5 mg by mouth daily.     amLODipine (NORVASC) 10 MG tablet Take 10 mg by mouth daily.     atorvastatin (LIPITOR) 80 MG tablet Take 80 mg by mouth daily.     cetirizine (ZYRTEC) 10 MG tablet Take 10 mg by mouth daily.     ergocalciferol (VITAMIN D2) 1.25 MG (50000 UT) capsule Take 50,000 Units by mouth once a week. Wednesday     nitroGLYCERIN (NITROSTAT) 0.4 MG SL tablet Place 0.4 mg under the tongue every 5 (five) minutes x 3 doses as needed for chest pain.     omeprazole (PRILOSEC) 20 MG capsule Take 20 mg by mouth daily.     sildenafil (VIAGRA) 50 MG tablet Take 50-100 mg by mouth daily as needed for erectile dysfunction.     sodium  fluoride (SODIUM FLUORIDE 5000 PPM) 1.1 % GEL dental gel Take 1 application by mouth at bedtime. Place 1 drop into each tooth space and leave in mouth for 5 minutes at bedtime.  Do not rinse with water, eat or drink for at least 30 minutes after use. 120 mL 11   Sodium Fluoride 1.1 % PSTE Place 1 pea-size drop into each tooth space of fluoride trays once a day at bedtime.  Leave trays in for 5 minutes and then remove.  Spit out excess fluoride, but DO NOT rinse with water, eat or  drink for at least 30 minutes after use. 100 mL 11   No current facility-administered medications for this encounter.    Physical Findings: The patient is in no acute distress. Patient is alert and oriented. Wt Readings from Last 3 Encounters:  07/29/22 176 lb 2 oz (79.9 kg)  05/07/22 173 lb 1.6 oz (78.5 kg)  10/22/21 174 lb 3.2 oz (79 kg)    height is 5' 9.5" (1.765 m) and weight is 176 lb 2 oz (79.9 kg). His temporal temperature is 96.3 F (35.7 C) (abnormal). His blood pressure is 149/96 (abnormal) and his pulse is 60. His respiration is 18 and oxygen saturation is 100%. .  General: Alert and oriented, in no acute distress HEENT: Head is normocephalic. Extraocular movements are intact.  No lesions in the mouth or upper throat  Neck: No palpable lymphadenopathy in the cervical or supraclavicular neck.    Skin: In the right occipital neck the pustular subcutaneous lesion has completely resolved. HEART RRR CHEST CTAB MSK: Normal muscle tone, ambulatory Psychiatric: Judgment and insight are intact. Affect is appropriate.   Lab Findings: Lab Results  Component Value Date   WBC 4.1 05/07/2022   HGB 15.2 05/07/2022   HCT 44.4 05/07/2022   MCV 94.3 05/07/2022   PLT 185 05/07/2022    Lab Results  Component Value Date   TSH 1.873 05/07/2022    Radiographic Findings: CT Chest W Contrast  Result Date: 07/27/2022 CLINICAL DATA:  67 year old male history of head and neck cancer (nasopharyngeal cancer) status post chemotherapy and radiation therapy. Follow-up study. * Tracking Code: BO * EXAM: CT CHEST WITH CONTRAST TECHNIQUE: Multidetector CT imaging of the chest was performed during intravenous contrast administration. RADIATION DOSE REDUCTION: This exam was performed according to the departmental dose-optimization program which includes automated exposure control, adjustment of the mA and/or kV according to patient size and/or use of iterative reconstruction technique. CONTRAST:   111m OMNIPAQUE IOHEXOL 300 MG/ML  SOLN COMPARISON:  PET-CT 07/21/2021. FINDINGS: Cardiovascular: Heart size is normal. There is no significant pericardial fluid, thickening or pericardial calcification. There is aortic atherosclerosis, as well as atherosclerosis of the great vessels of the mediastinum and the coronary arteries, including calcified atherosclerotic plaque in the left main, left anterior descending and right coronary arteries. Mediastinum/Nodes: No pathologically enlarged mediastinal or hilar lymph nodes. Esophagus is unremarkable in appearance. No axillary lymphadenopathy. Lungs/Pleura: Ground-glass attenuation and septal thickening in the apices of both lungs, likely mild postradiation changes. No suspicious appearing pulmonary nodules or masses are noted. No acute consolidative airspace disease. No pleural effusions. Upper Abdomen: Unremarkable. Musculoskeletal: There are no aggressive appearing lytic or blastic lesions noted in the visualized portions of the skeleton. IMPRESSION: 1. No findings to suggest metastatic disease in the thorax. 2. Aortic atherosclerosis, in addition to left main and 2 vessel coronary artery disease. Please note that although the presence of coronary artery calcium documents the presence of coronary  artery disease, the severity of this disease and any potential stenosis cannot be assessed on this non-gated CT examination. Assessment for potential risk factor modification, dietary therapy or pharmacologic therapy may be warranted, if clinically indicated. Aortic Atherosclerosis (ICD10-I70.0). Electronically Signed   By: Vinnie Langton M.D.   On: 07/27/2022 10:29   CT Soft Tissue Neck W Contrast  Result Date: 07/27/2022 CLINICAL DATA:  Nasopharyngeal cancer treated with chemotherapy and radiation. Follow-up. EXAM: CT NECK WITH CONTRAST TECHNIQUE: Multidetector CT imaging of the neck was performed using the standard protocol following the bolus administration of  intravenous contrast. RADIATION DOSE REDUCTION: This exam was performed according to the departmental dose-optimization program which includes automated exposure control, adjustment of the mA and/or kV according to patient size and/or use of iterative reconstruction technique. CONTRAST:  118m OMNIPAQUE IOHEXOL 300 MG/ML  SOLN COMPARISON:  08/07/2020 CT.  PET scan 09/27/2020 and 07/21/2021. FINDINGS: Pharynx and larynx: No evidence of residual or recurrent mucosal or submucosal lesion with specific attention to the nasopharynx. Salivary glands: Parotid and submandibular glands appear normal. Large previously seen right parotid has resolved. Thyroid: Normal Lymph nodes: Complete resolution of previously seen massive lymphadenopathy on both sides of the neck. Residual nodal tissue in those locations within normal limits in size. Vascular: No abnormal vascular finding. Limited intracranial: Normal Visualized orbits: Normal Mastoids and visualized paranasal sinuses: Clear Skeleton: Chronic cervical spondylosis and facet arthropathy. Upper chest: Lung apices are clear. Other: None IMPRESSION: 1. No evidence of residual or recurrent mucosal or submucosal lesion with specific attention to the nasopharynx. 2. Complete resolution of previously seen massive lymphadenopathy on both sides of the neck. Residual nodal tissue in those locations within normal limits in size. Electronically Signed   By: MNelson ChimesM.D.   On: 07/27/2022 09:30    Impression/Plan:    1) Head and Neck Cancer Status: NED - he is elated. His QOL is excellent. 1a) Incidental vessel disease on CT chest - referring to Cardiology  2) Nutritional Status: no issues  3) Risk Factors: The patient has been educated about risk factors including alcohol and tobacco abuse; they understand that avoidance of excessive  alcohol and any tobacco is important to prevent recurrences as well as other cancers.  He denies any smoking whatsoever   4) Swallowing:  continue SLP exercises -he denies any issues  5) Dental: Encouraged to establish regular care with community dentistry, and dental hygiene including fluoride rinses.  KLeta Spellerprovided OTC products today for dental health and dry mouth. As Dr. OBenson Norwayhas resigned, pt will establish care with community dentist.  6) Thyroid function: check annually w med onc or PCP Lab Results  Component Value Date   TSH 1.873 05/07/2022    7) I will see him back in 12 months with surveillance imaging - CT neck/chest w contrast  8) JLinda Hedgesnotified pt is interested in being a mProduct manager He would be outstanding at this.  On date of service, in total, I spent 30 minutes on this encounter. Patient was seen in person. Note signed after encounter date - minutes pertain to date of service only. _____________________________________   SEppie Gibson MD

## 2022-08-04 ENCOUNTER — Other Ambulatory Visit (HOSPITAL_COMMUNITY): Payer: Self-pay

## 2022-08-05 ENCOUNTER — Encounter (HOSPITAL_COMMUNITY): Payer: 59 | Admitting: Dentistry

## 2022-08-06 ENCOUNTER — Ambulatory Visit (AMBULATORY_SURGERY_CENTER): Payer: 59 | Admitting: *Deleted

## 2022-08-06 VITALS — Ht 69.5 in | Wt 176.0 lb

## 2022-08-06 DIAGNOSIS — Z1211 Encounter for screening for malignant neoplasm of colon: Secondary | ICD-10-CM

## 2022-08-06 MED ORDER — NA SULFATE-K SULFATE-MG SULF 17.5-3.13-1.6 GM/177ML PO SOLN: 1.0000 | Freq: Once | ORAL | 0 refills | Status: AC

## 2022-08-06 NOTE — Progress Notes (Signed)
No egg or soy allergy known to patient  No issues known to pt with past sedation with any surgeries or procedures Patient denies ever being told they had issues or difficulty with intubation  No FH of Malignant Hyperthermia Pt is not on diet pills Pt is not on  home 02  Pt is not on blood thinners  Pt denies issues with constipation  Pt is not on dialysis Pt denies any upcoming cardiac testing 08/11/22 check up for health no issues Radiation and Chemo tx last one June 2022 Pt encouraged to use to use Singlecare or Goodrx to reduce cost  Patient's chart reviewed by Alan Henry CNRA prior to previsit and patient appropriate for the [facility].  Previsit completed and red dot placed by patient's name on their procedure day (on provider's schedule).  Visit by phone Instructions reviewed with pt and pt states understanding. Instructed to review again prior to procedure. Pt states they will.   Instructions sent by mail with coupon and instructions by my chart

## 2022-08-11 ENCOUNTER — Encounter: Payer: Self-pay | Admitting: Cardiovascular Disease

## 2022-08-11 ENCOUNTER — Ambulatory Visit: Payer: 59 | Attending: Cardiovascular Disease | Admitting: Cardiovascular Disease

## 2022-08-11 VITALS — BP 140/94 | HR 81 | Ht 69.5 in | Wt 172.0 lb

## 2022-08-11 DIAGNOSIS — Z8249 Family history of ischemic heart disease and other diseases of the circulatory system: Secondary | ICD-10-CM

## 2022-08-11 DIAGNOSIS — I10 Essential (primary) hypertension: Secondary | ICD-10-CM

## 2022-08-11 DIAGNOSIS — R931 Abnormal findings on diagnostic imaging of heart and coronary circulation: Secondary | ICD-10-CM | POA: Insufficient documentation

## 2022-08-11 DIAGNOSIS — I2510 Atherosclerotic heart disease of native coronary artery without angina pectoris: Secondary | ICD-10-CM | POA: Insufficient documentation

## 2022-08-11 DIAGNOSIS — E782 Mixed hyperlipidemia: Secondary | ICD-10-CM

## 2022-08-11 NOTE — Assessment & Plan Note (Signed)
Sister had bypass surgery at age 65.

## 2022-08-11 NOTE — Progress Notes (Signed)
08/11/2022 Alan Leech Sr.   25-Jun-1956  [id number]  Primary Physician Nolene Ebbs, MD Primary Cardiologist: Lorretta Harp MD Lupe Carney, [place]  HPI:  Alan CRASS Sr. is a 67 y.o.  mildly overweight divorced African-American male father 6 children, grandfather 61 grandchildren who is retired from at age 14 from working in [place]. He was referred by Dr. Claudie Leach for diagnostic coronary angiography because of accelerated angina.  I last saw him in the office 11/14/2019.  His risk factors include treated hypertension, diabetes and hyperlipidemia. He is never smoked. His sister did have CABG at age 24. Is never had a heart attack or stroke. He has noted new onset exertional chest pain over the last year which become more frequent.  Apparently his angina resolved and he never ended up having a cardiac catheterization.  He is fairly active and denies chest pain.  He was diagnosed with nasopharyngeal cancer stage IVa and is currently in remission after chemotherapy and radiation therapy.  He is very active and walks on a daily basis.  He did have a screening chest CT performed 07/27/2022 revealing coronary calcification in the left main, LAD and RCA distributions.   Current Meds  Medication Sig   amLODipine (NORVASC) 10 MG tablet Take 10 mg by mouth daily.   atorvastatin (LIPITOR) 80 MG tablet Take 80 mg by mouth daily.   cetirizine (ZYRTEC) 10 MG tablet Take 10 mg by mouth daily.   ergocalciferol (VITAMIN D2) 1.25 MG (50000 UT) capsule Take 50,000 Units by mouth once a week. Wednesday   Na Sulfate-K Sulfate-Mg Sulf 17.5-3.13-1.6 GM/177ML SOLN Take by mouth.   omeprazole (PRILOSEC) 20 MG capsule Take 20 mg by mouth daily.   sildenafil (VIAGRA) 100 MG tablet Take 100 mg by mouth daily as needed.   sodium fluoride (SODIUM FLUORIDE 5000 PPM) 1.1 % GEL dental gel Take 1 application by mouth at bedtime. Place 1 drop into each tooth space and leave in  mouth for 5 minutes at bedtime.  Do not rinse with water, eat or drink for at least 30 minutes after use.     No Known Allergies  Social History   Socioeconomic History   Marital status: Married    Spouse name: Not on file   Number of children: Not on file   Years of education: Not on file   Highest education level: Not on file  Occupational History   Not on file  Tobacco Use   Smoking status: Never   Smokeless tobacco: Never  Vaping Use   Vaping Use: Never used  Substance and Sexual Activity   Alcohol use: Not Currently    Comment: very rarely   Drug use: No   Sexual activity: Not Currently  Other Topics Concern   Not on file  Social History Narrative   Not on file   Social Determinants of Health   Financial Resource Strain: Not on file  Food Insecurity: No Food Insecurity (10/15/2020)   Hunger Vital Sign    Worried About Running Out of Food in the Last Year: Never true    Ran Out of Food in the Last Year: Never true  Transportation Needs: No Transportation Needs (10/15/2020)   PRAPARE - Hydrologist (Medical): No    Lack of Transportation (Non-Medical): No  Physical Activity: Not on file  Stress: No Stress Concern Present (10/15/2020)   [employer] -  Occupational Stress Questionnaire    Feeling of Stress : Not at all  Social Connections: Socially Integrated (10/15/2020)   Social Connection and Isolation Panel [NHANES]    Frequency of Communication with Friends and Family: More than three times a week    Frequency of Social Gatherings with Friends and Family: Twice a week    Attends Religious Services: More than 4 times per year    Active Member of [employer] or Organizations: Yes    Attends Archivist Meetings: 1 to 4 times per year    Marital Status: Married  Human resources officer Violence: Not on file     Review of Systems: General: negative for chills, fever, night sweats or weight changes.   Cardiovascular: negative for chest pain, dyspnea on exertion, edema, orthopnea, palpitations, paroxysmal nocturnal dyspnea or shortness of breath Dermatological: negative for rash Respiratory: negative for cough or wheezing Urologic: negative for hematuria Abdominal: negative for nausea, vomiting, diarrhea, bright red blood per rectum, melena, or hematemesis Neurologic: negative for visual changes, syncope, or dizziness All other systems reviewed and are otherwise negative except as noted above.    Blood pressure (!) 140/94, pulse 81, height 5' 9.5" (1.765 m), weight 172 lb (78 kg), SpO2 96 %.  General appearance: alert and no distress Neck: no adenopathy, no carotid bruit, no JVD, supple, symmetrical, trachea midline, and thyroid not enlarged, symmetric, no tenderness/mass/nodules Lungs: clear to auscultation bilaterally Heart: regular rate and rhythm, S1, S2 normal, no murmur, click, rub or gallop Extremities: extremities normal, atraumatic, no cyanosis or edema Pulses: 2+ and symmetric Skin: Skin color, texture, turgor normal. No rashes or lesions Neurologic: Grossly normal  EKG sinus rhythm at 81 with LVH voltage with repolarization changes.  The T wave inversions are new since his previous EKG.  I personally reviewed this EKG.  ASSESSMENT AND PLAN:   Essential hypertension History of essential hypertension a blood pressure measured today at 140/94.  He is on amlodipine.  I am going to have him keep a 30-day blood pressure log and see a Pharm.D. back in 4 weeks to further evaluate.  Hyperlipidemia History of hyperlipidemia on statin therapy followed by his PCP.  Family history of heart disease Sister had bypass surgery at age 74.  Coronary artery calcification seen on CT scan Mr. Luppino was referred back to me by Eppie Gibson, MD, his radiation oncologist, because of coronary calcification seen on chest CT performed 07/27/2022.  Calcium was seen in the left main, LAD and RCA  territories.  He does have a family history of heart disease.  He is completely asymptomatic on atorvastatin.  I am going to get a coronary calcium score to further quantitate.     Lorretta Harp MD FACP,FACC,FAHA, [facility] 08/11/2022 10:34 AM

## 2022-08-11 NOTE — Patient Instructions (Signed)
Medication Instructions:  Your physician recommends that you continue on your current medications as directed. Please refer to the Current Medication list given to you today.  *If you need a refill on your cardiac medications before your next appointment, please call your pharmacy*   Testing/Procedures: Dr. Gwenlyn Found has ordered a CT coronary calcium score.   Test locations:  [place]   This is $99 out of pocket.   Coronary CalciumScan A coronary calcium scan is an imaging test used to look for deposits of calcium and other fatty materials (plaques) in the inner lining of the blood vessels of the heart (coronary arteries). These deposits of calcium and plaques can partly clog and narrow the coronary arteries without producing any symptoms or warning signs. This puts a person at risk for a heart attack. This test can detect these deposits before symptoms develop. Tell a health care provider about: Any allergies you have. All medicines you are taking, including vitamins, herbs, eye drops, creams, and over-the-counter medicines. Any problems you or family members have had with anesthetic medicines. Any blood disorders you have. Any surgeries you have had. Any medical conditions you have. Whether you are pregnant or may be pregnant. What are the risks? Generally, this is a safe procedure. However, problems may occur, including: Harm to a pregnant woman and her unborn baby. This test involves the use of radiation. Radiation exposure can be dangerous to a pregnant woman and her unborn baby. If you are pregnant, you generally should not have this procedure done. Slight increase in the risk of cancer. This is because of the radiation involved in the test. What happens before the procedure? No preparation is needed for this procedure. What happens during the procedure? You will undress and remove any jewelry around your neck or chest. You will put on a hospital  gown. Sticky electrodes will be placed on your chest. The electrodes will be connected to an electrocardiogram (ECG) machine to record a tracing of the electrical activity of your heart. A CT scanner will take pictures of your heart. During this time, you will be asked to lie still and hold your breath for 2-3 seconds while a picture of your heart is being taken. The procedure may vary among health care providers and hospitals. What happens after the procedure? You can get dressed. You can return to your normal activities. It is up to you to get the results of your test. Ask your health care provider, or the department that is doing the test, when your results will be ready. Summary A coronary calcium scan is an imaging test used to look for deposits of calcium and other fatty materials (plaques) in the inner lining of the blood vessels of the heart (coronary arteries). Generally, this is a safe procedure. Tell your health care provider if you are pregnant or may be pregnant. No preparation is needed for this procedure. A CT scanner will take pictures of your heart. You can return to your normal activities after the scan is done. This information is not intended to replace advice given to you by your health care provider. Make sure you discuss any questions you have with your health care provider. Document Released: 12/19/2007 Document Revised: 05/11/2016 Document Reviewed: 05/11/2016 Elsevier Interactive Patient Education  2017 [place]: At [facility], you and your health needs are our priority.  As part of our continuing mission to provide you with exceptional heart care, we have created  designated Provider Care Teams.  These Care Teams include your primary Cardiologist (physician) and Advanced Practice Providers (APPs -  Physician Assistants and Nurse Practitioners) who all work together to provide you with the care you need, when you need it.  We recommend  signing up for the patient portal called "MyChart".  Sign up information is provided on this After Visit Summary.  MyChart is used to connect with patients for Virtual Visits (Telemedicine).  Patients are able to view lab/test results, encounter notes, upcoming appointments, etc.  Non-urgent messages can be sent to your provider as well.   To learn more about what you can do with MyChart, go to [email or web address].    Your next appointment:   12 month(s)  Provider:   Quay Burow, MD   Other Instructions Dr. Gwenlyn Found recommends getting a omron upper arm blood pressure cuff.  Dr. Gwenlyn Found has requested that you schedule an appointment with one of our clinical pharmacists for a blood pressure check appointment within the next 4 weeks.  If you monitor your blood pressure (BP) at home, please bring your BP cuff and your BP readings with you to this appointment  HOW TO TAKE YOUR BLOOD PRESSURE: Rest 5 minutes before taking your blood pressure. Don't smoke or drink caffeinated beverages for at least 30 minutes before. Take your blood pressure before (not after) you eat. Sit comfortably with your back supported and both feet on the floor (don't cross your legs). Elevate your arm to heart level on a table or a desk. Use the proper sized cuff. It should fit smoothly and snugly around your bare upper arm. There should be enough room to slip a fingertip under the cuff. The bottom edge of the cuff should be 1 inch above the crease of the elbow. Ideally, take 3 measurements at one sitting and record the average.

## 2022-08-11 NOTE — Assessment & Plan Note (Signed)
History of hyperlipidemia on statin therapy followed by his PCP 

## 2022-08-11 NOTE — Assessment & Plan Note (Signed)
Alan Henry was referred back to me by Eppie Gibson, MD, his radiation oncologist, because of coronary calcification seen on chest CT performed 07/27/2022.  Calcium was seen in the left main, LAD and RCA territories.  He does have a family history of heart disease.  He is completely asymptomatic on atorvastatin.  I am going to get a coronary calcium score to further quantitate.

## 2022-08-11 NOTE — Assessment & Plan Note (Signed)
History of essential hypertension a blood pressure measured today at 140/94.  He is on amlodipine.  I am going to have him keep a 30-day blood pressure log and see a Pharm.D. back in 4 weeks to further evaluate.

## 2022-08-17 ENCOUNTER — Encounter: Payer: Self-pay | Admitting: Gastroenterology

## 2022-08-27 ENCOUNTER — Encounter: Payer: 59 | Admitting: Gastroenterology

## 2022-09-09 ENCOUNTER — Ambulatory Visit (AMBULATORY_SURGERY_CENTER): Payer: 59

## 2022-09-09 VITALS — Ht 69.5 in | Wt 175.0 lb

## 2022-09-09 DIAGNOSIS — Z1211 Encounter for screening for malignant neoplasm of colon: Secondary | ICD-10-CM

## 2022-09-09 MED ORDER — NA SULFATE-K SULFATE-MG SULF 17.5-3.13-1.6 GM/177ML PO SOLN: 1.0000 | Freq: Once | ORAL | 0 refills | Status: AC

## 2022-09-09 NOTE — Progress Notes (Signed)

## 2022-09-10 ENCOUNTER — Encounter: Payer: Self-pay | Admitting: Gastroenterology

## 2022-09-29 ENCOUNTER — Ambulatory Visit: Payer: 59 | Admitting: Pharmacist Clinician (PhC)/ Clinical Pharmacy Specialist

## 2022-09-29 ENCOUNTER — Ambulatory Visit (HOSPITAL_BASED_OUTPATIENT_CLINIC_OR_DEPARTMENT_OTHER)
Admission: RE | Admit: 2022-09-29 | Discharge: 2022-09-29 | Disposition: A | Discharge status: Home / Self Care | Payer: 59 | Source: Ambulatory Visit | Attending: Cardiovascular Disease | Admitting: Cardiovascular Disease

## 2022-09-29 ENCOUNTER — Encounter: Payer: Self-pay | Admitting: Pharmacist Clinician (PhC)/ Clinical Pharmacy Specialist

## 2022-09-29 VITALS — BP 169/91 | HR 75 | Ht 69.5 in | Wt 181.5 lb

## 2022-09-29 DIAGNOSIS — I10 Essential (primary) hypertension: Secondary | ICD-10-CM

## 2022-09-29 DIAGNOSIS — E782 Mixed hyperlipidemia: Secondary | ICD-10-CM | POA: Insufficient documentation

## 2022-09-29 DIAGNOSIS — Z8249 Family history of ischemic heart disease and other diseases of the circulatory system: Secondary | ICD-10-CM | POA: Insufficient documentation

## 2022-09-29 DIAGNOSIS — I2510 Atherosclerotic heart disease of native coronary artery without angina pectoris: Secondary | ICD-10-CM | POA: Insufficient documentation

## 2022-09-29 MED ORDER — VALSARTAN 160 MG PO TABS: 160.0000 mg | ORAL_TABLET | Freq: Every day | ORAL | 1 refills | Status: DC

## 2022-09-29 NOTE — Progress Notes (Signed)
Office Visit    Patient Name: Alan Henry. 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 is a 67 y.o. male patient of Dr Alan Henry, in the office today for hypertension management  Had nasopharangeal cancer stage IVa in remission  Blood Pressure Goal:  130/80  Current Medications:  amlodipine 10 mg qd, metoprolol succ 12.5 mg qd ??,   Family Hx:   both parents had htn, mom cancer, dad emphysea; sister with CABG at age 8, now doing well, had leg amputation at 68, now in her 16; 6 children all healthy  Social Hx:      Tobacco: no  Alcohol: occasional - celebratory  Caffeine:no caffeine  Diet:    more home cooked meals, variety of protein; vegetables ffc; occasional cookies for snacking, more likely fruit  Exercise: walks, still plumbing work  Home BP readings:  systolic 123456 , high 123XX123, one in to [id number]; diastolic [id number] with a few 123XX123  Home cuff, arm, new after JB visit  Adherence Assessment  Do you ever forget to take your medication? [] Yes [x] No  Do you ever skip doses due to side effects? [] Yes [x] No  Do you have trouble affording your medicines? [] Yes [x] No  Are you ever unable to pick up your medication due to transportation difficulties? [] Yes [x] No  Do you ever stop taking your medications because you don't believe they are helping? [] Yes [x] No     Accessory Clinical Findings    Lab Results  Component Value Date   CREATININE 1.27 (H) 05/07/2022   BUN 16 05/07/2022   NA 140 05/07/2022   K 3.5 05/07/2022   CL 103 05/07/2022   CO2 30 05/07/2022   Lab Results  Component Value Date   ALT 8 05/07/2022   AST 16  05/07/2022   ALKPHOS 57 05/07/2022   BILITOT 0.8 05/07/2022   No results Henry for: "HGBA1C"  Home Medications    Current Outpatient Medications  Medication Sig Dispense Refill   amLODipine (NORVASC) 10 MG tablet Take 10 mg by mouth daily.     atorvastatin (LIPITOR) 80 MG tablet Take 80 mg by mouth daily.     cetirizine (ZYRTEC) 10 MG tablet Take 10 mg by mouth daily.     ergocalciferol (VITAMIN D2) 1.25 MG (50000 UT) capsule Take 50,000 Units by mouth once a week. Wednesday     omeprazole (PRILOSEC) 20 MG capsule Take 20 mg by mouth daily.     sildenafil (VIAGRA) 100 MG tablet Take 100 mg by mouth daily as needed.     sodium fluoride (SODIUM FLUORIDE 5000 PPM) 1.1 % GEL dental gel Take 1 application by mouth at bedtime. Place 1 drop into each tooth space and leave in mouth for 5 minutes at bedtime.  Do not rinse with water, eat or drink for at least 30 minutes after use. 120 mL 11   valsartan (DIOVAN) 160 MG tablet Take 1 tablet (160 mg total) by mouth daily. 90 tablet 1   No current facility-administered medications for this visit.     HYPERTENSION CONTROL Vitals:   09/29/22 0932 09/29/22 0941  BP: (!) 175/82 (!) 169/91    The patient's blood pressure is elevated above target today.  In order to address the patient's elevated BP: A new medication was prescribed today.      Assessment & Plan    Essential hypertension Assessment: BP is uncontrolled in office BP 169/91 mmHg;  above the goal (<130/80). He did just drive back from [place] overnight, got home at 3 am, then had Coronary CT at 8 am Tolerates amlodipine well without any side effects Denies SOB, palpitation, chest pain, headaches,or swelling Reiterated the importance of regular exercise and low salt diet   Plan:  Start taking valsartan 160 mg once daily  Continue taking amlodipine 10 mg  Patient to keep record of BP readings with heart rate and report to [place] at the next visit Patient to follow up with PharmD in 1  month  Labs ordered today:  BMET in 2 weeks   Tommy Medal PharmD CPP [facility]  [address] [place], [place] [address] [phone]

## 2022-09-29 NOTE — Assessment & Plan Note (Signed)
Assessment: BP is uncontrolled in office BP 169/91 mmHg;  above the goal (<130/80). He did just drive back from [place] overnight, got home at 3 am, then had Coronary CT at 8 am Tolerates amlodipine well without any side effects Denies SOB, palpitation, chest pain, headaches,or swelling Reiterated the importance of regular exercise and low salt diet   Plan:  Start taking valsartan 160 mg once daily  Continue taking amlodipine 10 mg  Patient to keep record of BP readings with heart rate and report to [place] at the next visit Patient to follow up with PharmD in 1 month  Labs ordered today:  BMET in 2 weeks

## 2022-09-29 NOTE — Patient Instructions (Addendum)
Follow up appointment: Wednesday May 1 at 8:30 am  Go to the lab in 2 weeks to check kidney function  Take your BP meds as follows:  Start valsartan 160 mg once daily with your amlodipine  Check your blood pressure at home daily (if able) and keep record of the readings.  Hypertension "High blood pressure"  Hypertension is often called "The Silent Killer." It rarely causes symptoms until it is extremely  high or has done damage to other organs in the body. For this reason, you should have your  blood pressure checked regularly by your physician. We will check your blood pressure  every time you see a provider at one of our offices.   Your blood pressure reading consists of two numbers. Ideally, blood pressure should be  below 120/80. The first ("top") number is called the systolic pressure. It measures the  pressure in your arteries as your heart beats. The second ("bottom") number is called the diastolic pressure. It measures the pressure in your arteries as the heart relaxes between beats.  The benefits of getting your blood pressure under control are enormous. A 10-point  reduction in systolic blood pressure can reduce your risk of stroke by 27% and heart failure by 28%  Your blood pressure goal is 100-130/60-80  To check your pressure at home you will need to:  1. Sit up in a chair, with feet flat on the floor and back supported. Do not cross your ankles or legs. 2. Rest your left arm so that the cuff is about heart level. If the cuff goes on your upper arm,  then just relax the arm on the table, arm of the chair or your lap. If you have a wrist cuff, we  suggest relaxing your wrist against your chest (think of it as Pledging the Flag with the  wrong arm).  3. Place the cuff snugly around your arm, about 1 inch above the crook of your elbow. The  cords should be inside the groove of your elbow.  4. Sit quietly, with the cuff in place, for about 5 minutes. After that 5  minutes press the power  button to start a reading. 5. Do not talk or move while the reading is taking place.  6. Record your readings on a sheet of paper. Although most cuffs have a memory, it is often  easier to see a pattern developing when the numbers are all in front of you.  7. You can repeat the reading after 1-3 minutes if it is recommended  Make sure your bladder is empty and you have not had caffeine or tobacco within the last 30 min  Always bring your blood pressure log with you to your appointments. If you have not brought your monitor in to be double checked for accuracy, please bring it to your next appointment.  You can find a list of quality blood pressure cuffs at validatebp.org

## 2022-10-15 ENCOUNTER — Encounter: Payer: 59 | Admitting: Gastroenterology

## 2022-10-17 LAB — BASIC METABOLIC PANEL
BUN/Creatinine Ratio: 11 (ref 10–24)
BUN: 15 mg/dL (ref 8–27)
CO2: 27 mmol/L (ref 20–29)
Calcium: 9.9 mg/dL (ref 8.6–10.2)
Chloride: 100 mmol/L (ref 96–106)
Creatinine, Ser: 1.38 mg/dL — ABNORMAL HIGH (ref 0.76–1.27)
Glucose: 99 mg/dL (ref 70–99)
Potassium: 4.2 mmol/L (ref 3.5–5.2)
Sodium: 142 mmol/L (ref 134–144)
eGFR: 57 mL/min/{1.73_m2} — ABNORMAL LOW (ref >59)

## 2022-10-28 ENCOUNTER — Encounter (HOSPITAL_COMMUNITY): Payer: 59 | Admitting: Dentistry

## 2022-11-04 ENCOUNTER — Ambulatory Visit: Payer: 59

## 2022-12-09 ENCOUNTER — Ambulatory Visit: Payer: 59

## 2023-01-01 NOTE — Progress Notes (Deleted)
Patient ID: Alan GAVIDIA Sr.                 DOB: Jun 17, 1956                      MRN: [id number]      HPI: Alan JUSZCZYK Sr. is a 67 y.o. male referred by Dr. Concepcion Elk to HTN clinic. PMH is significant for ASCVD (CAC score 231), hyperlipidemia   Current HTN meds: valsartan 160 mg daily, amlodipine 10 mg daily  Previously tried: none BP goal: <130/80  Family History:   Social History:   Diet:   Exercise:  {types:28256}  Home BP readings:  Date SBP/DBP  HR              Average      Wt Readings from Last 3 Encounters:  09/29/22 181 lb 8 oz (82.3 kg)  09/09/22 175 lb (79.4 kg)  08/11/22 172 lb (78 kg)   BP Readings from Last 3 Encounters:  09/29/22 (!) 169/91  08/11/22 (!) 140/94  07/29/22 (!) 149/96   Pulse Readings from Last 3 Encounters:  09/29/22 75  08/11/22 81  07/29/22 60    Renal function: CrCl cannot be calculated (Patient's most recent lab result is older than the maximum 21 days allowed.).  Past Medical History:  Diagnosis Date   Chest pain    2021   Chronic kidney disease    History of kidney stones    Hyperlipidemia    Hypertension    Nasopharyngeal cancer (HCC)    Pneumonia    Sleep apnea     Current Outpatient Medications on File Prior to Visit  Medication Sig Dispense Refill   amLODipine (NORVASC) 10 MG tablet Take 10 mg by mouth daily.     atorvastatin (LIPITOR) 80 MG tablet Take 80 mg by mouth daily.     cetirizine (ZYRTEC) 10 MG tablet Take 10 mg by mouth daily.     ergocalciferol (VITAMIN D2) 1.25 MG (50000 UT) capsule Take 50,000 Units by mouth once a week. Wednesday     omeprazole (PRILOSEC) 20 MG capsule Take 20 mg by mouth daily.     sildenafil (VIAGRA) 100 MG tablet Take 100 mg by mouth daily as needed.     sodium fluoride (SODIUM FLUORIDE 5000 PPM) 1.1 % GEL dental gel Take 1 application by mouth at bedtime. Place 1 drop into each tooth space and leave in mouth for 5 minutes at bedtime.  Do not rinse with water, eat or drink  for at least 30 minutes after use. 120 mL 11   valsartan (DIOVAN) 160 MG tablet Take 1 tablet (160 mg total) by mouth daily. 90 tablet 1   No current facility-administered medications on file prior to visit.    Allergies  Allergen Reactions   Osmolite [Alitraq]     There were no vitals taken for this visit.   Assessment/Plan:  1. Hypertension -  No problem-specific Assessment & Plan notes found for this encounter.      Thank you  Carmela Hurt, Pharm.D [facility] A Division of [facility] [address] [place], [place] [address]  Phone: [phone]; Fax: [phone]

## 2023-01-04 ENCOUNTER — Ambulatory Visit: Payer: 59 | Attending: Cardiovascular Disease | Admitting: Student

## 2023-01-04 ENCOUNTER — Telehealth: Payer: Self-pay | Admitting: Pharmacist

## 2023-01-04 ENCOUNTER — Encounter: Payer: Self-pay | Admitting: Cardiovascular Disease

## 2023-01-04 NOTE — Telephone Encounter (Signed)
Call patient for missing today's appointment. Patient had responded to the appointment reminder message that he can not make it to this appointment as he will be out of town.   We have reschedule the appointment for July 31 for HTN follow up.

## 2023-02-03 ENCOUNTER — Encounter: Payer: Self-pay | Admitting: Pharmacist Clinician (PhC)/ Clinical Pharmacy Specialist

## 2023-02-03 ENCOUNTER — Ambulatory Visit: Payer: 59 | Admitting: Pharmacist Clinician (PhC)/ Clinical Pharmacy Specialist

## 2023-02-03 VITALS — BP 138/77 | HR 76 | Ht 69.5 in | Wt 185.2 lb

## 2023-02-03 DIAGNOSIS — I10 Essential (primary) hypertension: Secondary | ICD-10-CM | POA: Diagnosis not present

## 2023-02-03 MED ORDER — VALSARTAN 320 MG PO TABS: 320.0000 mg | ORAL_TABLET | Freq: Every day | ORAL | 3 refills | Status: DC

## 2023-02-03 NOTE — Assessment & Plan Note (Signed)
Assessment: BP is uncontrolled in office BP 138/77 mmHg;  above the goal (<130/80). Tolerates amlodipine and valsartan well without any side effects Denies SOB, palpitation, chest pain, headaches,or swelling Reiterated the importance of regular exercise and low salt diet   Plan:  Increase valsartan to 320 mg daily Continue taking amlodipine 10 mg daily Patient to keep record of BP readings with heart rate and report to [place] at the next visit Patient to follow up with PharmD in 3 months  Labs ordered today:  BMET in 2 weeks Patient to work on increasing exercise and monitoring sodium intake

## 2023-02-03 NOTE — Patient Instructions (Signed)
Follow up appointment: Thursday October 31 at 8:30 am  Go to the lab in 2 weeks to check kidney function  Take your BP meds as follows: INCREASE VALSARTAN TO 320 mg DAILY  Check your blood pressure at home daily (if able) and keep record of the readings.  Hypertension "High blood pressure"  Hypertension is often called "The Silent Killer." It rarely causes symptoms until it is extremely  high or has done damage to other organs in the body. For this reason, you should have your  blood pressure checked regularly by your physician. We will check your blood pressure  every time you see a provider at one of our offices.   Your blood pressure reading consists of two numbers. Ideally, blood pressure should be  below 120/80. The first ("top") number is called the systolic pressure. It measures the  pressure in your arteries as your heart beats. The second ("bottom") number is called the diastolic pressure. It measures the pressure in your arteries as the heart relaxes between beats.  The benefits of getting your blood pressure under control are enormous. A 10-point  reduction in systolic blood pressure can reduce your risk of stroke by 27% and heart failure by 28%  Your blood pressure goal is < 130/80  To check your pressure at home you will need to:  1. Sit up in a chair, with feet flat on the floor and back supported. Do not cross your ankles or legs. 2. Rest your left arm so that the cuff is about heart level. If the cuff goes on your upper arm,  then just relax the arm on the table, arm of the chair or your lap. If you have a wrist cuff, we  suggest relaxing your wrist against your chest (think of it as Pledging the Flag with the  wrong arm).  3. Place the cuff snugly around your arm, about 1 inch above the crook of your elbow. The  cords should be inside the groove of your elbow.  4. Sit quietly, with the cuff in place, for about 5 minutes. After that 5 minutes press the power   button to start a reading. 5. Do not talk or move while the reading is taking place.  6. Record your readings on a sheet of paper. Although most cuffs have a memory, it is often  easier to see a pattern developing when the numbers are all in front of you.  7. You can repeat the reading after 1-3 minutes if it is recommended  Make sure your bladder is empty and you have not had caffeine or tobacco within the last 30 min  Always bring your blood pressure log with you to your appointments. If you have not brought your monitor in to be double checked for accuracy, please bring it to your next appointment.  You can find a list of quality blood pressure cuffs at validatebp.org

## 2023-02-03 NOTE — Progress Notes (Signed)
Office Visit    Patient Name: Alan CRISANTO Sr. 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 is a 67 y.o. male patient of Dr Allyson Sabal, in the office today for hypertension management.  He is currently in remission after stage IVa nasopharyngeal cancer.  I last saw him in March of this year, pressure was 169/91 and I added valsartan 160 mg every day.  Labs drawn after 2 weeks showed a slight bump in SCr (< 10%), otherwise normal.    Returns today for follow up.   Has not had any issues with adding valsartan.  States home BP readings haven't changed much, staying mostly in the 140's systolic.    Blood Pressure Goal:  130/80  Current Medications:  amlodipine 10 mg qd, valsartan 160 mg every day  Family Hx:   both parents had htn, mom cancer, dad emphysea; sister with CABG at age 66, now doing well, had leg amputation at 91, now in her 52; 6 children all healthy  Social Hx:      Tobacco: no  Alcohol: occasional - celebratory  Caffeine:no caffeine  Diet:    more home cooked meals, variety of protein; vegetables ffc; occasional cookies for snacking, more likely fruit  Exercise: walks, still plumbing work  Home BP readings:   Averaging 140's systolic, only once to 160, nothing in 120's; diastolic 70-80's   systolic 140's , high 130's, one in to 150's; diastolic 70's with a few 80s  Home cuff, arm, new after JB visit  Adherence Assessment  Do you ever forget to take your medication? [] Yes [x] No  Do you ever skip doses due to side effects? [] Yes [x] No  Do you have trouble affording your medicines? [] Yes [x] No  Are you ever unable to pick up  your medication due to transportation difficulties? [] Yes [x] No  Do you ever stop taking your medications because you don't believe they are helping? [] Yes [x] No     Accessory Clinical Findings    Lab Results  Component Value Date   CREATININE 1.38 (H) 10/16/2022   BUN 15 10/16/2022   NA 142 10/16/2022   K 4.2 10/16/2022   CL 100 10/16/2022   CO2 27 10/16/2022   Lab Results  Component Value Date   ALT 8 05/07/2022   AST 16 05/07/2022   ALKPHOS 57 05/07/2022   BILITOT 0.8 05/07/2022   No results found for: "HGBA1C"  Home Medications    Current Outpatient Medications  Medication Sig Dispense Refill   valsartan (DIOVAN) 320 MG tablet Take 1 tablet (320 mg total) by mouth daily. 90 tablet 3   amLODipine (NORVASC) 10 MG tablet Take 10 mg by mouth daily.     atorvastatin (LIPITOR) 80 MG tablet Take 80 mg by mouth daily.     cetirizine (ZYRTEC) 10 MG tablet Take 10 mg by mouth daily.     ergocalciferol (VITAMIN D2) 1.25 MG (50000 UT) capsule Take 50,000 Units by mouth once a week. Wednesday     omeprazole (PRILOSEC) 20 MG capsule Take 20 mg by mouth daily.     sildenafil (VIAGRA) 100 MG tablet Take  100 mg by mouth daily as needed.     sodium fluoride (SODIUM FLUORIDE 5000 PPM) 1.1 % GEL dental gel Take 1 application by mouth at bedtime. Place 1 drop into each tooth space and leave in mouth for 5 minutes at bedtime.  Do not rinse with water, eat or drink for at least 30 minutes after use. 120 mL 11   No current facility-administered medications for this visit.         Assessment & Plan    Essential hypertension Assessment: BP is uncontrolled in office BP 138/77 mmHg;  above the goal (<130/80). Tolerates amlodipine and valsartan well without any side effects Denies SOB, palpitation, chest pain, headaches,or swelling Reiterated the importance of regular exercise and low salt diet   Plan:  Increase valsartan to 320 mg daily Continue taking amlodipine 10 mg daily Patient  to keep record of BP readings with heart rate and report to [place] at the next visit Patient to follow up with PharmD in 3 months  Labs ordered today:  BMET in 2 weeks Patient to work on increasing exercise and monitoring sodium intake   Phillips Hay PharmD CPP [facility]  [address] [place], [place] [address] [phone]

## 2023-05-06 ENCOUNTER — Ambulatory Visit: Payer: Self-pay

## 2023-05-10 ENCOUNTER — Inpatient Hospital Stay: Payer: Medicare Other

## 2023-05-10 ENCOUNTER — Inpatient Hospital Stay: Payer: Medicare Other | Attending: Hematology and Oncology | Admitting: Hematology and Oncology

## 2023-05-10 ENCOUNTER — Ambulatory Visit: Payer: Medicare Other

## 2023-05-10 VITALS — BP 162/102 | HR 90 | Temp 98.1°F | Resp 16 | Wt 181.7 lb

## 2023-05-10 DIAGNOSIS — R0981 Nasal congestion: Secondary | ICD-10-CM | POA: Insufficient documentation

## 2023-05-10 DIAGNOSIS — Z923 Personal history of irradiation: Secondary | ICD-10-CM | POA: Diagnosis not present

## 2023-05-10 DIAGNOSIS — I890 Lymphedema, not elsewhere classified: Secondary | ICD-10-CM | POA: Diagnosis not present

## 2023-05-10 DIAGNOSIS — Z8522 Personal history of malignant neoplasm of nasal cavities, middle ear, and accessory sinuses: Secondary | ICD-10-CM | POA: Diagnosis present

## 2023-05-10 DIAGNOSIS — C113 Malignant neoplasm of anterior wall of nasopharynx: Secondary | ICD-10-CM

## 2023-05-10 DIAGNOSIS — R682 Dry mouth, unspecified: Secondary | ICD-10-CM | POA: Diagnosis not present

## 2023-05-10 DIAGNOSIS — R252 Cramp and spasm: Secondary | ICD-10-CM | POA: Diagnosis not present

## 2023-05-10 DIAGNOSIS — Z9221 Personal history of antineoplastic chemotherapy: Secondary | ICD-10-CM | POA: Insufficient documentation

## 2023-05-10 DIAGNOSIS — Z8042 Family history of malignant neoplasm of prostate: Secondary | ICD-10-CM | POA: Diagnosis not present

## 2023-05-10 LAB — CMP (CANCER CENTER ONLY)
ALT: 11 U/L (ref 0–44)
AST: 15 U/L (ref 15–41)
Albumin: 4.6 g/dL (ref 3.5–5.0)
Alkaline Phosphatase: 73 U/L (ref 38–126)
Anion gap: 5 (ref 5–15)
BUN: 19 mg/dL (ref 8–23)
CO2: 30 mmol/L (ref 22–32)
Calcium: 9.2 mg/dL (ref 8.9–10.3)
Chloride: 100 mmol/L (ref 98–111)
Creatinine: 1.45 mg/dL — ABNORMAL HIGH (ref 0.61–1.24)
GFR, Estimated: 53 mL/min — ABNORMAL LOW (ref >60)
Glucose, Bld: 115 mg/dL — ABNORMAL HIGH (ref 70–99)
Potassium: 4 mmol/L (ref 3.5–5.1)
Sodium: 135 mmol/L (ref 135–145)
Total Bilirubin: 0.5 mg/dL (ref <1.2)
Total Protein: 7.8 g/dL (ref 6.5–8.1)

## 2023-05-10 LAB — CBC WITH DIFFERENTIAL/PLATELET
Abs Immature Granulocytes: 0.01 10*3/uL (ref 0.00–0.07)
Basophils Absolute: 0 10*3/uL (ref 0.0–0.1)
Basophils Relative: 0 %
Eosinophils Absolute: 0 10*3/uL (ref 0.0–0.5)
Eosinophils Relative: 0 %
HCT: 44.7 % (ref 39.0–52.0)
Hemoglobin: 15.7 g/dL (ref 13.0–17.0)
Immature Granulocytes: 0 %
Lymphocytes Relative: 14 %
Lymphs Abs: 0.5 10*3/uL — ABNORMAL LOW (ref 0.7–4.0)
MCH: 32.7 pg (ref 26.0–34.0)
MCHC: 35.1 g/dL (ref 30.0–36.0)
MCV: 93.1 fL (ref 80.0–100.0)
Monocytes Absolute: 0.2 10*3/uL (ref 0.1–1.0)
Monocytes Relative: 6 %
Neutro Abs: 2.7 10*3/uL (ref 1.7–7.7)
Neutrophils Relative %: 80 %
Platelets: 188 10*3/uL (ref 150–400)
RBC: 4.8 MIL/uL (ref 4.22–5.81)
RDW: 14.4 % (ref 11.5–15.5)
WBC: 3.3 10*3/uL — ABNORMAL LOW (ref 4.0–10.5)
nRBC: 0 % (ref 0.0–0.2)

## 2023-05-10 LAB — TSH: TSH: 2.308 u[IU]/mL (ref 0.350–4.500)

## 2023-05-10 NOTE — Assessment & Plan Note (Signed)
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   SCC nasopharynx. No evidence or recurrence Continue ENT and rad onc follow up as scheduled.  Neck Spasms New onset, intermittent, and self-resolving. No changes in activities or exercises. Possible positional etiology. -Continue current neck exercises. -Consider changes in sleep position or pillow.  Dry Mouth Chronic issue, managed with hydration. -Continue hydration efforts.  Nasal Congestion Chronic issue, managed with daily saline and Flonase. -Continue saline and Flonase as needed.  General Health Maintenance -Order annual thyroid labs today. -Follow-up with ENT and Dr. Basilio Cairo as scheduled. -Return visit in 1 year unless changes occur.

## 2023-05-10 NOTE — Progress Notes (Signed)
[facility] FOLLOW-UP progress notes  Patient Care Team: Fleet Contras, MD as PCP - General (Internal Medicine) Fleet Contras, MD (Internal Medicine) Malmfelt, Lise Auer, RN as Oncology Nurse Navigator Lonie Peak, MD as Consulting Physician (Radiation Oncology) Osborn Coho, MD (Inactive) as Consulting Physician (Otolaryngology) Rachel Moulds, MD as Consulting Physician (Hematology and Oncology) Runell Gess, MD as Consulting Physician (Cardiology)  CHIEF COMPLAINTS/PURPOSE OF VISIT:  Nasopharyngeal cancer, status post chemoradiation therapy  I reviewed the patient's records extensive and collaborated the history with the patient. Summary of his history is as follows: Oncology History  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HCC)  08/06/2020 Initial Diagnosis   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HCC)   08/06/2020 Initial Diagnosis   Alan Henry presented with six-month history of gradually enlarging bilateral lymph nodes with associated mild discomfort and pressure.   08/07/2020 Imaging   He had CT soft tissue neck done on August 07, 2020 which showed nasopharyngeal soft tissue prominence, greatest soft tissue effacement of adjacent right parapharyngeal fat suspected to be at least enlarged right retropharyngeal lymph node.  Bulky bilateral cervical midline likely right intraparotid lymphadenopathy   08/30/2020 Pathology Results   Biopsy of right neck lymph node on 08/30/2020 revealed: squamous cell carcinoma, p16 positive. EBV ordered, negative.   09/27/2020 PET scan   1. There is intense FDG uptake within the area of increased soft tissue fullness in the posterior nasopharynx. Cannot exclude primary nasopharyngeal neoplasm. 2. Extensive, bulky bilateral FDG avid cervical adenopathy. Large FDG avid lymph node is also identified within the right parotid gland. Imaging findings compatible with metastatic adenopathy. 3. Subcentimeter right  supraclavicular lymph node exhibits mild FDG uptake above background activity. Equivocal for nodal metastasis. No additional signs of thoracic, abdominal, or pelvic metastasis. No evidence for osseous metastatic disease.    10/04/2020 Cancer Staging   Staging form: Pharynx - Nasopharynx, AJCC 8th Edition - Clinical stage from 10/04/2020: Stage IVA (cT1, cN3, cM0) - Signed by Lonie Peak, MD on 10/04/2020 Stage prefix: Initial diagnosis   10/11/2020 - 12/13/2020 Chemotherapy   The patient received weekly cisplatin with radiation   10/24/2020 - 12/12/2020 Radiation Therapy   Radiation Treatment Dates: 10/24/2020 through 12/12/2020 Site Technique Total Dose (Gy) Dose per Fx (Gy) Completed Fx Beam Energies  Neck: HN_NasoP IMRT 70/70 2 35/35 6X     03/18/2021 PET scan   Marked interval response to therapy. Near complete resolution of nodal enlargement seen on the previous study and no substantial FDG uptake in the anterior neck.    New area of hypermetabolic activity corresponds to an area in the RIGHT occipital region which shows predominantly fatty density. Given the FDG uptake associated with this location would suggest focused ultrasound with biopsy as warranted for further evaluation. Given the intense nature of the uptake, metastatic disease in this area is strongly considered, CT imaging findings however are atypical for disease.   Small amount of gas within the central parotid gland could even be within the parotid duct. Correlate with any symptoms on this side. No surrounding stranding.   Aortic Atherosclerosis (ICD10-I70.0).     05/01/2021 Procedure   Successful 20 French gastrostomy removal    Interval History  HISTORY OF PRESENTING ILLNESS:   Alan Plummer Sr. 67 y.o. male is here for a follow up.  Discussed the use of AI scribe software for clinical note transcription with the patient, who gave verbal consent to proceed.  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 His main complaint today is dry mouth and neck spasms, reports that his symptoms have been stable. He has been managing his dry mouth by staying hydrated. However, he has been experiencing spasms in his neck for the past month. The spasms occur mostly on one side and are triggered by bending over. The spasms resolve after standing still for about 15-20 seconds. He denies any changes in his routine or exercises that could have triggered these spasms. 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 He also travels between [place] and his current location regularly. He has not noticed any changes in his diet or eating habits. He continues to manage his dry mouth by drinking water regularly. He also reports some lymphedema, which he manages with regular massages. He has been using saline in his nostrils daily to manage blockage, especially during the change of seasons.     Rest of the pertinent 10 point ROS reviewed and neg.  MEDICAL HISTORY:  Past Medical History:  Diagnosis Date   Chest pain    2021   Chronic kidney disease    History of kidney stones    Hyperlipidemia    Hypertension    Nasopharyngeal cancer (HCC)    Pneumonia    Sleep apnea     SURGICAL HISTORY: Past Surgical History:  Procedure Laterality Date   FINE NEEDLE ASPIRATION BIOPSY     HERNIA REPAIR     Umbilicatl hernia   IR GASTROSTOMY TUBE REMOVAL  05/01/2021   IR IMAGING GUIDED PORT INSERTION  10/18/2020   IR REMOVAL TUN ACCESS W/ PORT W/O FL MOD SED  05/01/2021   LAPAROSCOPIC INSERTION GASTROSTOMY TUBE N/A 10/23/2020   Procedure: LAPAROSCOPIC ASSISTED PEG TUBE;  Surgeon: Fritzi Mandes, MD;  Location: [facility];  Service: General;  Laterality: N/A;  60   ROTATOR CUFF REPAIR     Torn Labrum      SOCIAL HISTORY: Social History   Socioeconomic History   Marital status: Married    Spouse name: Not on file   Number of children: Not on file   Years of education:  Not on file   Highest education level: Not on file  Occupational History   Not on file  Tobacco Use   Smoking status: Never   Smokeless tobacco: Never  Vaping Use   Vaping status: Never Used  Substance and Sexual Activity   Alcohol use: Not Currently    Comment: very rarely   Drug use: No   Sexual activity: Not Currently  Other Topics Concern   Not on file  Social History Narrative   Not on file   Social Determinants of Health   Financial Resource Strain: Not on file  Food Insecurity: Low Risk  (12/03/2022)   [facility]   Hunger Vital Sign    Worried About Running Out of Food in the Last Year: Never true    Ran Out of Food in the Last Year: Never true  Transportation Needs: No Transportation Needs (12/03/2022)   [facility]   Transportation    In the past 12 months, has lack of reliable transportation kept you from medical appointments, meetings, work or from getting things needed for daily living? : No  Physical Activity: Not on file  Stress: No Stress Concern Present (10/15/2020)   [employer] of Occupational Health - Occupational Stress Questionnaire  Feeling of Stress : Not at all  Social Connections: Socially Integrated (10/15/2020)   Social Connection and Isolation Panel [NHANES]    Frequency of Communication with Friends and Family: More than three times a week    Frequency of Social Gatherings with Friends and Family: Twice a week    Attends Religious Services: More than 4 times per year    Active Member of [employer] or Organizations: Yes    Attends Banker Meetings: 1 to 4 times per year    Marital Status: Married  Catering manager Violence: Not on file    FAMILY HISTORY: Family History  Problem Relation Age of Onset   Prostate cancer Brother    Colon cancer Neg Hx    Colon polyps Neg Hx    Esophageal cancer Neg Hx    Rectal cancer Neg Hx    Stomach cancer Neg Hx      ALLERGIES:  is allergic to osmolite [alitraq].  MEDICATIONS:  Current Outpatient Medications  Medication Sig Dispense Refill   amLODipine (NORVASC) 10 MG tablet Take 10 mg by mouth daily.     atorvastatin (LIPITOR) 80 MG tablet Take 80 mg by mouth daily.     cetirizine (ZYRTEC) 10 MG tablet Take 10 mg by mouth daily.     ergocalciferol (VITAMIN D2) 1.25 MG (50000 UT) capsule Take 50,000 Units by mouth once a week. Wednesday     omeprazole (PRILOSEC) 20 MG capsule Take 20 mg by mouth daily.     sildenafil (VIAGRA) 100 MG tablet Take 100 mg by mouth daily as needed.     sodium fluoride (SODIUM FLUORIDE 5000 PPM) 1.1 % GEL dental gel Take 1 application by mouth at bedtime. Place 1 drop into each tooth space and leave in mouth for 5 minutes at bedtime.  Do not rinse with water, eat or drink for at least 30 minutes after use. 120 mL 11   valsartan (DIOVAN) 320 MG tablet Take 1 tablet (320 mg total) by mouth daily. 90 tablet 3   No current facility-administered medications for this visit.    REVIEW OF SYSTEMS:   Constitutional: Denies fevers, chills or abnormal night sweats Eyes: Denies blurriness of vision, double vision or watery eyes Ears, nose, mouth, throat, and face: Denies mucositis or sore throat Respiratory: Denies cough, dyspnea or wheezes Cardiovascular: Denies palpitation, chest discomfort or lower extremity swelling Gastrointestinal:  Denies nausea, heartburn or change in bowel habits Skin: Denies abnormal skin rashes Lymphatics: Denies new lymphadenopathy or easy bruising Neurological:Denies numbness, tingling or new weaknesses Behavioral/Psych: Mood is stable, no new changes  All other systems were reviewed with the patient and are negative.  PHYSICAL EXAMINATION: ECOG PERFORMANCE STATUS: 1 - Symptomatic but completely ambulatory  Vitals:   05/10/23 0929 05/10/23 0947  BP: (!) 181/107 (!) 162/102  Pulse: 90   Resp: 16   Temp: 98.1 F (36.7 C)   SpO2: 99%       Filed Weights   05/10/23 0929  Weight: 181 lb 11.2 oz (82.4 kg)      Physical Exam Constitutional:      Appearance: Normal appearance.  Cardiovascular:     Rate and Rhythm: Normal rate and regular rhythm.     Pulses: Normal pulses.     Heart sounds: Normal heart sounds.  Pulmonary:     Effort: Pulmonary effort is normal.     Breath sounds: Normal breath sounds.  Musculoskeletal:        General: No swelling or tenderness.  Cervical back: Normal range of motion and neck supple. No rigidity.  Lymphadenopathy:     Cervical: No cervical adenopathy.  Skin:    General: Skin is warm and dry.  Neurological:     General: No focal deficit present.     Mental Status: He is alert.  Psychiatric:        Mood and Affect: Mood normal.      LABORATORY DATA:  I have reviewed the data as listed Lab Results  Component Value Date   WBC 4.1 05/07/2022   HGB 15.2 05/07/2022   HCT 44.4 05/07/2022   MCV 94.3 05/07/2022   PLT 185 05/07/2022   Recent Labs    10/16/22 1109  NA 142  K 4.2  CL 100  CO2 27  GLUCOSE 99  BUN 15  CREATININE 1.38*  CALCIUM 9.9    RADIOGRAPHIC STUDIES: I have personally reviewed the radiological images as listed and agreed with the findings in the report. No results found.  ASSESSMENT & PLAN:   Cancer of nasopharyngeal soft palate [facility]) This is a pleasant 67 yr old with SCC nasopharynx diagnosed Feb 2022, completed concurrent CRT, refused adj chemo now on surveillance here for a follow up.   SCC nasopharynx. No evidence or recurrence Continue ENT and rad onc follow up as scheduled.  Neck Spasms New onset, intermittent, and self-resolving. No changes in activities or exercises. Possible positional etiology. -Continue current neck exercises. -Consider changes in sleep position or pillow.  Dry Mouth Chronic issue, managed with hydration. -Continue hydration efforts.  Nasal Congestion Chronic issue, managed with daily saline and  Flonase. -Continue saline and Flonase as needed.  General Health Maintenance -Order annual thyroid labs today. -Follow-up with ENT and Dr. Basilio Cairo as scheduled. -Return visit in 1 year unless changes occur.    Orders Placed This Encounter  Procedures   CBC with Differential/Platelet    Standing Status:   Standing    Number of Occurrences:   22    Standing Expiration Date:   05/09/2024   CMP (Cancer Center only)    Standing Status:   Future    Standing Expiration Date:   05/09/2024   TSH    Standing Status:   Standing    Number of Occurrences:   22    Standing Expiration Date:   05/09/2024    All questions were answered. The patient knows to call the clinic with any problems, questions or concerns. Total time spent: 30 minutes including history, physical exam, review of records, counseling and coordination of care Rachel Moulds, MD 05/10/2023 10:04 AM

## 2023-06-08 ENCOUNTER — Ambulatory Visit: Payer: Medicare Other

## 2023-07-14 ENCOUNTER — Telehealth: Payer: Self-pay | Admitting: *Deleted

## 2023-07-14 NOTE — Telephone Encounter (Signed)
 CALLED PATIENT TO INFORM OF CT FOR 07-28-23- ARRIVAL TIME- 12:45 PM @ [facility], NO RESTRICTIONS TO SCAN, PATIENT TO RECEIVE RESULTS FROM DR. SQUIRE ON 07/30/23 @ 10 AM, SPOKE WITH PATIENT AND HE IS AWARE OF THESE APPTS. AND THE INSTRUCTIONS

## 2023-07-20 ENCOUNTER — Encounter: Payer: Self-pay | Admitting: Pharmacist Clinician (PhC)/ Clinical Pharmacy Specialist

## 2023-07-20 ENCOUNTER — Ambulatory Visit: Payer: Medicare Other | Attending: Cardiology | Admitting: Pharmacist Clinician (PhC)/ Clinical Pharmacy Specialist

## 2023-07-20 VITALS — BP 176/96 | HR 88 | Ht 69.0 in | Wt 181.2 lb

## 2023-07-20 DIAGNOSIS — I10 Essential (primary) hypertension: Secondary | ICD-10-CM | POA: Diagnosis not present

## 2023-07-20 MED ORDER — AMLODIPINE-VALSARTAN-HCTZ 10-320-25 MG PO TABS: 1.0000 | ORAL_TABLET | Freq: Every day | ORAL | 1 refills | Status: AC

## 2023-07-20 NOTE — Progress Notes (Signed)
 Office Visit    Patient Name: Alan Henry. Date of Encounter: 07/20/2023  Primary Care Provider:  Shelda Atlas, MD Primary Cardiologist:  None  Chief Complaint    Hypertension  Significant Past Medical History   hyperlipidemia No labs in Epic, on atorvastatin  80  ASCVD seen on CT, will have scoring done today    Allergies  Allergen Reactions   Osmolite [Alitraq]     History of Present Illness    HPI: 68 yo male who was last seen for HTN on 02/03/2023. In office reading then was 138/77. Does not keep a log of home BP readings, but reports readings in the 130's/70's. Last visit his valsartan  was increased from 160 mg daily to 320 mg. 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 [] Yes [x] No  Do you ever skip doses due to side effects? [] Yes [x] No  Do you have trouble affording your medicines? [] Yes [x] No  Are you ever unable to pick up your medication due to transportation difficulties? [] Yes [x] No  Do you ever stop taking your medications because you don't believe they are helping? [] Yes [x] No     Accessory Clinical Findings    Lab Results  Component Value Date   CREATININE 1.45 (H) 05/10/2023   BUN 19 05/10/2023   NA 135 05/10/2023   K 4.0 05/10/2023   CL 100 05/10/2023   CO2 30 05/10/2023   Lab Results  Component Value Date   ALT 11 05/10/2023   AST 15 05/10/2023   ALKPHOS 73 05/10/2023   BILITOT  0.5 05/10/2023   No results found for: HGBA1C  Home Medications    Current Outpatient Medications  Medication Sig Dispense Refill   amLODIPine -Valsartan -HCTZ 10-320-25 MG TABS Take 1 tablet by mouth daily. 90 tablet 1   atorvastatin  (LIPITOR ) 80 MG tablet Take 80 mg by mouth daily.     cetirizine (ZYRTEC) 10 MG tablet Take 10 mg by mouth daily.     ergocalciferol (VITAMIN D2) 1.25 MG (50000 UT) capsule Take 50,000 Units by mouth once a week. Wednesday     omeprazole (PRILOSEC) 20 MG capsule Take 20 mg by mouth daily.     sildenafil (VIAGRA) 100 MG tablet Take 100 mg by mouth daily as needed.     sodium fluoride  (SODIUM FLUORIDE  5000 PPM) 1.1 % GEL dental gel Take 1 application by mouth at bedtime. Place 1 drop into each tooth space and leave in mouth for 5 minutes at bedtime.  Do not rinse with water, eat or drink for at least 30 minutes after use. 120 mL 11   No current facility-administered medications for this visit.     Assessment & Plan    Essential hypertension Goal BP of <130/80, currently not controlled in office, 176/76 Continue current amlodipine  and valsartan  until they run out Start amlodipine /valsartan /hydrochlorothiazide 10/320/25  Keep a log of BP readings and bring them into your next appointment Follow up in  1 month  Ester Pica PharmD candidate [facility] class of 2025  I was with student and patient for entire visit and agree with above assessment and plan.  Atharv Barriere PharmD CPP [facility]  [address] [place], [place] [address] [phone]

## 2023-07-20 NOTE — Assessment & Plan Note (Signed)
 Goal BP of <130/80, currently not controlled in office, 176/76 Continue current amlodipine  and valsartan  until they run out Start amlodipine /valsartan /hydrochlorothiazide 10/320/25  Keep a log of BP readings and bring them into your next appointment Follow up in 1 month

## 2023-07-20 NOTE — Patient Instructions (Addendum)
 Follow up appointment: SCHEDULE FOLLOW UP WITH DR. COURT FOR FEB/MARCH  Take your BP meds as follows: START AMLODIPINE /VALSARTAN /HCTZ COMBINATION TABLET (10/320/25 MG).    STOP THE VALSARTAN  AND AMLODIPINE  TABLETS  IF THE INSURANCE WON'T COVER THIS MEDICATION, CONTINUE WITH THE VALSARTAN  AND AMLODIPINE  AND REACH OUT TO ME VIA MY CHART  Check your blood pressure at home daily (if able) and keep record of the readings.  Your blood pressure goal is < 130/80  To check your pressure at home you will need to:  1. Sit up in a chair, with feet flat on the floor and back supported. Do not cross your ankles or legs. 2. Rest your left arm so that the cuff is about heart level. If the cuff goes on your upper arm,  then just relax the arm on the table, arm of the chair or your lap. If you have a wrist cuff, we  suggest relaxing your wrist against your chest (think of it as Pledging the Flag with the  wrong arm).  3. Place the cuff snugly around your arm, about 1 inch above the crook of your elbow. The  cords should be inside the groove of your elbow.  4. Sit quietly, with the cuff in place, for about 5 minutes. After that 5 minutes press the power  button to start a reading. 5. Do not talk or move while the reading is taking place.  6. Record your readings on a sheet of paper. Although most cuffs have a memory, it is often  easier to see a pattern developing when the numbers are all in front of you.  7. You can repeat the reading after 1-3 minutes if it is recommended  Make sure your bladder is empty and you have not had caffeine or tobacco within the last 30 min  Always bring your blood pressure log with you to your appointments. If you have not brought your monitor in to be double checked for accuracy, please bring it to your next appointment.  You can find a list of quality blood pressure cuffs at [email or web address]  Important lifestyle changes to control high blood pressure  Intervention   Effect on the BP  Lose extra pounds and watch your waistline Weight loss is one of the most effective lifestyle changes for controlling blood pressure. If you're overweight or obese, losing even a small amount of weight can help reduce blood pressure. Blood pressure might go down by about 1 millimeter of mercury (mm Hg) with each kilogram (about 2.2 pounds) of weight lost.  Exercise regularly As a general goal, aim for at least 30 minutes of moderate physical activity every day. Regular physical activity can lower high blood pressure by about 5 to 8 mm Hg.  Eat a healthy diet Eating a diet rich in whole grains, fruits, vegetables, and low-fat dairy products and low in saturated fat and cholesterol. A healthy diet can lower high blood pressure by up to 11 mm Hg.  Reduce salt (sodium) in your diet Even a small reduction of sodium in the diet can improve heart health and reduce high blood pressure by about 5 to 6 mm Hg.  Limit alcohol One drink equals 12 ounces of beer, 5 ounces of wine, or 1.5 ounces of 80-proof liquor.  Limiting alcohol to less than one drink a day for women or two drinks a day for men can help lower blood pressure by about 4 mm Hg.   If you have any questions or concerns  please use My Chart to send questions or call the office at [phone]

## 2023-07-28 ENCOUNTER — Ambulatory Visit (HOSPITAL_COMMUNITY)
Admission: RE | Admit: 2023-07-28 | Discharge: 2023-07-28 | Disposition: A | Discharge status: Home / Self Care | Payer: Medicare Other | Source: Ambulatory Visit | Attending: Radiation Oncology | Admitting: Radiation Oncology

## 2023-07-28 ENCOUNTER — Encounter (HOSPITAL_COMMUNITY): Payer: Self-pay

## 2023-07-28 DIAGNOSIS — C113 Malignant neoplasm of anterior wall of nasopharynx: Secondary | ICD-10-CM | POA: Diagnosis present

## 2023-07-28 MED ORDER — IOHEXOL 300 MG/ML  SOLN
75.0000 mL | Freq: Once | INTRAMUSCULAR | Status: AC | PRN
  Administered 2023-07-28: 75 mL via INTRAVENOUS

## 2023-07-30 ENCOUNTER — Ambulatory Visit
Admission: RE | Admit: 2023-07-30 | Discharge: 2023-07-30 | Disposition: A | Discharge status: Home / Self Care | Payer: Medicare Other | Source: Ambulatory Visit | Attending: Radiation Oncology | Admitting: Radiation Oncology

## 2023-07-30 VITALS — BP 155/98 | HR 87 | Temp 97.7°F | Resp 18 | Ht 69.0 in | Wt 175.2 lb

## 2023-07-30 DIAGNOSIS — E041 Nontoxic single thyroid nodule: Secondary | ICD-10-CM | POA: Insufficient documentation

## 2023-07-30 DIAGNOSIS — Z85818 Personal history of malignant neoplasm of other sites of lip, oral cavity, and pharynx: Secondary | ICD-10-CM | POA: Diagnosis present

## 2023-07-30 DIAGNOSIS — N200 Calculus of kidney: Secondary | ICD-10-CM | POA: Insufficient documentation

## 2023-07-30 DIAGNOSIS — Z923 Personal history of irradiation: Secondary | ICD-10-CM | POA: Diagnosis not present

## 2023-07-30 DIAGNOSIS — Z79899 Other long term (current) drug therapy: Secondary | ICD-10-CM | POA: Insufficient documentation

## 2023-07-30 DIAGNOSIS — R634 Abnormal weight loss: Secondary | ICD-10-CM | POA: Insufficient documentation

## 2023-07-30 DIAGNOSIS — I700 Atherosclerosis of aorta: Secondary | ICD-10-CM | POA: Insufficient documentation

## 2023-07-30 DIAGNOSIS — M47812 Spondylosis without myelopathy or radiculopathy, cervical region: Secondary | ICD-10-CM | POA: Diagnosis not present

## 2023-07-30 DIAGNOSIS — C113 Malignant neoplasm of anterior wall of nasopharynx: Secondary | ICD-10-CM

## 2023-07-30 DIAGNOSIS — J324 Chronic pansinusitis: Secondary | ICD-10-CM | POA: Insufficient documentation

## 2023-07-30 NOTE — Progress Notes (Signed)
Radiation Oncology         [phone]) [phone] ________________________________  Name: Alan Plummer Sr. MRN: [id number]  Date: 07/30/2023  DOB: 01/28/1956  Follow-Up Visit Note  CC: Fleet Contras, MD  Osborn Coho, MD  Diagnosis and Prior Radiotherapy:    C11.3   ICD-10-CM   1. 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 Using a feeding tube?: Removed in November in 2022. Weight changes, if any: Lost 10 pounds within the last three weeks. Having loss of appetite and nausea. Patient reports he had one episode of vomiting.  Swallowing issues, if any: None Smoking or chewing tobacco? None Using fluoride toothpaste daily? Yes Last ENT visit was on: November 2024 Other notable issues, if any:   Patient reports he started having sinus congestion, sinus HA, lack of appetite, weight loss and fatigue during January. Feels lightheaded at times    Meds: Current Outpatient Medications  Medication Sig Dispense Refill   amLODIPine-Valsartan-HCTZ 10-320-25 MG TABS Take 1 tablet by mouth daily. 90 tablet 1   atorvastatin (LIPITOR) 80 MG tablet Take 80 mg by mouth daily.     cetirizine (ZYRTEC) 10 MG tablet Take 10 mg by mouth daily.     ergocalciferol (VITAMIN D2) 1.25 MG (50000 UT) capsule Take 50,000 Units by mouth once a week. Wednesday     omeprazole (PRILOSEC) 20 MG capsule Take 20 mg by mouth daily.      sildenafil (VIAGRA) 100 MG tablet Take 100 mg by mouth daily as needed.     sodium fluoride (SODIUM FLUORIDE 5000 PPM) 1.1 % GEL dental gel Take 1 application by mouth at bedtime. Place 1 drop into each tooth space and leave in mouth for 5 minutes at bedtime.  Do not rinse with water, eat or drink for at least 30 minutes after use. 120 mL 11   No current facility-administered medications for this encounter.    Physical Findings: The patient is in no acute distress. Patient is alert and oriented. Wt Readings from Last 3 Encounters:  07/30/23 175 lb 4 oz (79.5 kg)  07/20/23 181 lb 3.5 oz (82.2 kg)  05/10/23 181 lb 11.2 oz (82.4 kg)    height is 5\' 9"  (1.753 m) and weight is 175 lb 4 oz (79.5 kg). His oral temperature is 97.7 F (36.5 C). His blood pressure is 155/98 (abnormal) and his pulse is 87. His respiration is 18 and oxygen saturation is 100%. .  General: Alert and oriented, in no acute distress HEENT: Head is normocephalic. Extraocular movements are intact.  No lesions in the mouth or upper throat  Neck: No palpable lymphadenopathy in the cervical or supraclavicular neck.    Skin: Healthy appearing and intact over his face and neck HEART RRR CHEST CTAB Ext: No edema MSK: Normal muscle tone, ambulatory Psychiatric: Judgment and insight are intact. Affect is appropriate.   Lab Findings: Lab Results  Component Value  Date   WBC 3.3 (L) 05/10/2023   HGB 15.7 05/10/2023   HCT 44.7 05/10/2023   MCV 93.1 05/10/2023   PLT 188 05/10/2023    Lab Results  Component Value Date   TSH 2.308 05/10/2023    Radiographic Findings: CT Chest W Contrast Result Date: 07/29/2023 CLINICAL DATA:  Cancer of nasopharyngeal soft palate. Head and neck cancer, monitor. Diagnosis in 2022 with chemotherapy and radiation therapy. * Tracking Code: BO * EXAM: CT CHEST WITH CONTRAST TECHNIQUE: Multidetector CT imaging of the chest was performed during intravenous contrast administration. RADIATION DOSE  REDUCTION: This exam was performed according to the departmental dose-optimization program which includes automated exposure control, adjustment of the mA and/or kV according to patient size and/or use of iterative reconstruction technique. CONTRAST:  75mL OMNIPAQUE IOHEXOL 300 MG/ML  SOLN COMPARISON:  Cardiac CT 09/29/2022, chest CT 07/27/2022 and PET-CT 07/21/2021. FINDINGS: Neck findings dictated separately. Cardiovascular: Aortic and coronary artery atherosclerosis. No acute vascular findings. The heart size is normal. There is no pericardial effusion. Mediastinum/Nodes: There are no enlarged mediastinal, hilar or axillary lymph nodes. The thyroid gland, trachea and esophagus demonstrate no significant findings. Lungs/Pleura: No pleural effusion or pneumothorax. Stable radiation changes at both lung apices. No confluent airspace disease or suspicious pulmonary nodularity. Upper abdomen: No acute findings within the visualized upper abdomen. There are punctate nonobstructing left renal calculi. Musculoskeletal/Chest wall: There is no chest wall mass or suspicious osseous finding. IMPRESSION: 1. No evidence of metastatic disease within the chest. 2. Stable radiation changes at both lung apices. 3. Punctate nonobstructing left renal calculi. 4. Coronary and Aortic Atherosclerosis (ICD10-I70.0). 5. Neck findings dictated separately. Electronically Signed   By: Carey Bullocks M.D.   On: 07/29/2023 11:17   CT Soft Tissue Neck W Contrast Result Date: 07/29/2023 CLINICAL DATA:  Provided history: Cancer of nasopharyngeal soft palate. Head/neck cancer, monitor. Additional history provided: history of nasopharyngeal cancer diagnosed in 2022, status post chemotherapy and radiation. EXAM: CT NECK WITH CONTRAST TECHNIQUE: Multidetector CT imaging of the neck was performed using the standard protocol following the bolus administration of intravenous contrast. RADIATION DOSE REDUCTION: This exam was performed according to  the departmental dose-optimization program which includes automated exposure control, adjustment of the mA and/or kV according to patient size and/or use of iterative reconstruction technique. CONTRAST:  75mL OMNIPAQUE IOHEXOL 300 MG/ML  SOLN COMPARISON:  Neck CT 07/27/2022. Neck CT 08/07/2020. Prior head CT examinations 07/21/2021 and earlier. FINDINGS: Pharynx and larynx: Generalized mucosal/submucosal soft tissue thickening within the pharynx and supraglottic larynx consistent with post-radiation changes. No evidence of a residual/recurrent nasopharyngeal mass. Salivary glands: Atrophy of the right parotid and bilateral submandibular glands. Unremarkable appearance of the left submandibular gland. Thyroid: Subcentimeter nodule within the right thyroid lobe not meeting consensus criteria for ultrasound follow-up based on size. No follow-up imaging recommended. Reference: J Am Coll Radiol. 2015 Feb;12(2): 143-50. Lymph nodes: No pathologically enlarged cervical chain lymph nodes Vascular: The major vascular structures of the neck are patent. Limited intracranial: No evidence of an acute intracranial abnormality within the field of view. Visualized orbits: No orbital mass or acute orbital finding. Mastoids and visualized paranasal sinuses: Portions of the frontal sinuses are excluded from the field of view superiorly. Small-volume frothy secretions within the inferior frontal sinuses, bilaterally. Moderate-to-severe bilateral ethmoid sinusitis. Moderate-sized fluid level, and mild background mucosal thickening, within the right sphenoid sinus. Small fluid level, and mild background mucosal thickening, within the left sphenoid sinus. Moderate-sized fluid levels, and moderate background mucosal  thickening, within the bilateral maxillary sinuses. No significant mastoid effusion. Skeleton: Cervical spondylosis. No acute fracture or aggressive osseous lesion. Upper chest: Separately reported on same day chest CT.  IMPRESSION: 1. No evidence of a residual/recurrent nasopharyngeal mass. 2. No pathologically enlarged cervical chain lymph nodes. 3. Pansinusitis at the imaged levels, as described. Electronically Signed   By: Jackey Loge D.O.   On: 07/29/2023 09:42    Impression/Plan:    1) Head and Neck Cancer Status: I consider him cured.  We reviewed his imaging in detail.  He does have evidence of sinusitis.  It is unclear if this may be viral or bacterial in nature.  He denies fevers.  It is definitely bothersome and has been present for about 3 weeks.  We will ask ENT to see him next week to address this.  We talked about pushing his nutrition as well given that his appetite has been depressed and he is losing some weight  2) Nutritional Status: As above Wt Readings from Last 3 Encounters:  07/30/23 175 lb 4 oz (79.5 kg)  07/20/23 181 lb 3.5 oz (82.2 kg)  05/10/23 181 lb 11.2 oz (82.4 kg)    3) Risk Factors: The patient has been educated about risk factors including alcohol and tobacco abuse; they understand that avoidance of excessive  alcohol and any tobacco is important to prevent recurrences as well as other cancers.  He denies any smoking whatsoever   4) Swallowing: continue SLP exercises -he denies any issues  5) Dental: Encouraged to establish regular care with community dentistry, and dental hygiene including fluoride rinses.  As Dr. Chales Salmon has resigned, pt still needs to establish care with community dentist.  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  He will start training in the next month or so  8) F/u next available w/ ENT given sinusitis and in 10 mo w/ Med Onc (Dr Al Pimple.) At this point imaging can just be done as clinically indicated.  I will see him back on  an as-needed basis  On date of service, in total, I spent 30 minutes on this encounter.   _____________________________________   Lonie Peak, MD

## 2023-07-30 NOTE — Progress Notes (Addendum)
[name] presents today for follow-up after completing radiation to his nasopharynx on 12/12/2020 and to review CT scan results from 07/28/2023   Pain issues, if any: Patient having 3 out of 10 sinus headaches and lightheadedness. Using a feeding tube?: Removed in November in 2022. Weight changes, if any: Lost 10 pounds within the last three weeks. Having loss of appetite and nausea. Patient reports he had one episode of vomiting.  Swallowing issues, if any: None Smoking or chewing tobacco? None Using fluoride toothpaste daily? Yes Last ENT visit was on: November 2024 Other notable issues, if any:   Patient reports he started having lack of appetite, weight loss and fatigue during January. Feels lightheaded at times  BP (!) 155/98 (BP Location: Left Arm, Patient Position: Sitting)   Pulse 87   Temp 97.7 F (36.5 C) (Oral)   Resp 18   Ht 5\' 9"  (1.753 m)   Wt 175 lb 4 oz (79.5 kg)   SpO2 100%   BMI 25.88 kg/m    Filed Weights   07/30/23 1014  Weight: 175 lb 4 oz (79.5 kg)

## 2023-07-30 NOTE — Progress Notes (Signed)
Oncology Nurse Navigator Documentation   I met with Mr. Ormond during his follow up with Dr. Basilio Cairo today. He learned that his recent CT scans show no evidence of disease. He does have sinusitis as reported in the scan results that have made him feel tired with a decreased appetite. Dr. Basilio Cairo encouraged him for increase his po intake and I have gotten him scheduled with Aquilla Hacker PA at [facility] on 1/30 for further evaluation. I will make him aware of that appointment and encourage him to call me anytime for questions or concerns.  Hedda Slade RN, BSN, OCN Head & Neck Oncology Nurse Navigator [facility] at [facility] Phone # [phone]  Fax # [phone]

## 2023-08-23 ENCOUNTER — Encounter: Payer: Self-pay | Admitting: Cardiovascular Disease

## 2023-08-23 ENCOUNTER — Ambulatory Visit: Payer: Medicare Other | Attending: Cardiovascular Disease | Admitting: Cardiovascular Disease

## 2023-08-23 VITALS — BP 156/84 | HR 90 | Ht 69.0 in | Wt 179.0 lb

## 2023-08-23 DIAGNOSIS — R931 Abnormal findings on diagnostic imaging of heart and coronary circulation: Secondary | ICD-10-CM

## 2023-08-23 DIAGNOSIS — R079 Chest pain, unspecified: Secondary | ICD-10-CM | POA: Diagnosis not present

## 2023-08-23 DIAGNOSIS — I10 Essential (primary) hypertension: Secondary | ICD-10-CM

## 2023-08-23 DIAGNOSIS — E782 Mixed hyperlipidemia: Secondary | ICD-10-CM

## 2023-08-23 DIAGNOSIS — I2510 Atherosclerotic heart disease of native coronary artery without angina pectoris: Secondary | ICD-10-CM | POA: Diagnosis not present

## 2023-08-23 NOTE — Assessment & Plan Note (Signed)
History of essential hypertension blood pressure measured today 156/84.  He is on amlodipine, valsartan and hydrochlorothiazide.

## 2023-08-23 NOTE — Assessment & Plan Note (Signed)
Patient was experience chest pain several years ago when he was first sent to me and this has since resolved.

## 2023-08-23 NOTE — Progress Notes (Signed)
08/23/2023 [name] Sr.   [birth year]  [id number]  Primary Physician Fleet Contras, MD Primary Cardiologist: Runell Gess MD Alan Henry, [place]  HPI:  Alan HUFFAKER Sr. is a 68 y.o.  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 He was referred by Dr. Hanley Hays for diagnostic coronary angiography because of accelerated angina.  I last saw him in the office 08/11/2022.  His risk factors include treated hypertension, diabetes and hyperlipidemia. He is never smoked. His sister did have CABG at age 32. Is never had a heart attack or stroke. He has noted new onset exertional chest pain over the last year which become more frequent.   Apparently his angina resolved and he never ended up having a cardiac catheterization.  He is fairly active and denies chest pain.  He was diagnosed with nasopharyngeal cancer stage IVa and is currently in remission after chemotherapy and radiation therapy.  He is very active and walks on a daily basis.  He did have a screening chest CT performed 07/27/2022 revealing coronary calcification in the left main, LAD and RCA distributions.  Since I saw him a year ago he did have a coronary calcium score performed 10/02/2022 which was 231 distributed in all 3 coronary arteries.  He has remained asymptomatic.  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   Current Meds  Medication Sig   amLODIPine-Valsartan-HCTZ 10-320-25 MG TABS Take 1 tablet by mouth daily.   atorvastatin (LIPITOR) 80 MG tablet Take 80 mg by mouth daily.   cetirizine (ZYRTEC) 10 MG tablet Take 10 mg by mouth daily.   ergocalciferol (VITAMIN D2) 1.25 MG (50000 UT) capsule Take 50,000 Units by mouth once a week. Wednesday   fluticasone (FLONASE) 50 MCG/ACT nasal spray Place 2 sprays into both nostrils as needed.   omeprazole (PRILOSEC) 20 MG capsule Take 20  mg by mouth daily.   sildenafil (VIAGRA) 100 MG tablet Take 100 mg by mouth daily as needed.   sodium fluoride (SODIUM FLUORIDE 5000 PPM) 1.1 % GEL dental gel Take 1 application by mouth at bedtime. Place 1 drop into each tooth space and leave in mouth for 5 minutes at bedtime.  Do not rinse with water, eat or drink for at least 30 minutes after use.     Allergies  Allergen Reactions   Osmolite [Alitraq]     Social History   Socioeconomic History   Marital status: Married    Spouse name: Not on file   Number of children: Not on file   Years of education: Not on file   Highest education level: Not on file  Occupational History   Not on file  Tobacco Use   Smoking status: Never   Smokeless tobacco: Never  Vaping Use   Vaping status: Never Used  Substance and Sexual Activity   Alcohol use: Not Currently    Comment: very rarely   Drug use: No   Sexual activity: Not Currently  Other Topics Concern   Not on file  Social History Narrative   Not on file   Social Drivers of Health   Financial Resource Strain: Not on file  Food Insecurity: Low Risk  (08/05/2023)   Received from [facility]   Hunger Vital Sign    Worried About Running Out of Food in the Last Year: Never true  Ran Out of Food in the Last Year: Never true  Transportation Needs: No Transportation Needs (08/05/2023)   Received from [facility]    In the past 12 months, has lack of reliable transportation kept you from medical appointments, meetings, work or from getting things needed for daily living? : No  Physical Activity: Not on file  Stress: No Stress Concern Present (10/15/2020)   [employer] of Occupational Health - Occupational Stress Questionnaire    Feeling of Stress : Not at all  Social Connections: Socially Integrated (10/15/2020)   Social Connection and Isolation Panel [NHANES]    Frequency of Communication with Friends and Family: More than three times a week    Frequency  of Social Gatherings with Friends and Family: Twice a week    Attends Religious Services: More than 4 times per year    Active Member of [employer] or Organizations: Yes    Attends Banker Meetings: 1 to 4 times per year    Marital Status: Married  Catering manager Violence: Not on file     Review of Systems: General: negative for chills, fever, night sweats or weight changes.  Cardiovascular: negative for chest pain, dyspnea on exertion, edema, orthopnea, palpitations, paroxysmal nocturnal dyspnea or shortness of breath Dermatological: negative for rash Respiratory: negative for cough or wheezing Urologic: negative for hematuria Abdominal: negative for nausea, vomiting, diarrhea, bright red blood per rectum, melena, or hematemesis Neurologic: negative for visual changes, syncope, or dizziness All other systems reviewed and are otherwise negative except as noted above.    Blood pressure (!) 156/84, pulse 90, height 5\' 9"  (1.753 m), weight 179 lb (81.2 kg), SpO2 98%.  General appearance: alert and no distress Neck: no adenopathy, no carotid bruit, no JVD, supple, symmetrical, trachea midline, and thyroid not enlarged, symmetric, no tenderness/mass/nodules Lungs: clear to auscultation bilaterally Heart: regular rate and rhythm, S1, S2 normal, no murmur, click, rub or gallop Extremities: extremities normal, atraumatic, no cyanosis or edema Pulses: 2+ and symmetric Skin: Skin color, texture, turgor normal. No rashes or lesions Neurologic: Grossly normal  EKG EKG Interpretation Date/Time:  Monday August 23 2023 10:33:59 EST Ventricular Rate:  90 PR Interval:  158 QRS Duration:  68 QT Interval:  336 QTC Calculation: 411 R Axis:   51  Text Interpretation: Sinus rhythm with Premature supraventricular complexes Anteroseptal infarct (cited on or before 23-Oct-2020) T wave abnormality, consider inferolateral ischemia When compared with ECG of 23-Oct-2020 10:03, Premature  supraventricular complexes are now Present Questionable change in initial forces of Septal leads T wave inversion now evident in Inferior leads Inverted T waves have replaced nonspecific T wave abnormality in Lateral leads Confirmed by Nanetta Batty [phone]) on 08/23/2023 11:01:49 AM    ASSESSMENT AND PLAN:   Essential hypertension History of essential hypertension blood pressure measured today 156/84.  He is on amlodipine, valsartan and hydrochlorothiazide.  Hyperlipidemia History of hyperlipidemia on high-dose statin therapy.  Will recheck a lipid liver profile.  Chest pain of uncertain etiology Patient was experience chest pain several years ago when he was first sent to me and this has since resolved.  Elevated coronary artery calcium score Mr. Maselli initially had a CT scan performed 07/27/2022 that showed coronary calcification.  This led to a coronary calcium score 10/02/2022 that was 231 with calcium in all 3 coronary arteries.  He is completely asymptomatic.  Based on this we have been more aggressive with risk factor modification.     Runell Gess  MD FACP,FACC,FAHA, [facility] 08/23/2023 11:08 AM

## 2023-08-23 NOTE — Patient Instructions (Signed)
 Medication Instructions:  Your physician recommends that you continue on your current medications as directed. Please refer to the Current Medication list given to you today.  *If you need a refill on your cardiac medications before your next appointment, please call your pharmacy*   Lab Work: Your physician recommends that you return for lab work in: the next week or 2 for FASTING lipid/liver panel  If you have labs (blood work) drawn today and your tests are completely normal, you will receive your results only by: MyChart Message (if you have MyChart) OR A paper copy in the mail If you have any lab test that is abnormal or we need to change your treatment, we will call you to review the results.   Follow-Up: At [facility], you and your health needs are our priority.  As part of our continuing mission to provide you with exceptional heart care, we have created designated Provider Care Teams.  These Care Teams include your primary Cardiologist (physician) and Advanced Practice Providers (APPs -  Physician Assistants and Nurse Practitioners) who all work together to provide you with the care you need, when you need it.  We recommend signing up for the patient portal called "MyChart".  Sign up information is provided on this After Visit Summary.  MyChart is used to connect with patients for Virtual Visits (Telemedicine).  Patients are able to view lab/test results, encounter notes, upcoming appointments, etc.  Non-urgent messages can be sent to your provider as well.   To learn more about what you can do with MyChart, go to [email or web address].    Your next appointment:   12 month(s)  Provider:   Nanetta Batty, MD     Other Instructions

## 2023-08-23 NOTE — Assessment & Plan Note (Signed)
Alan Henry initially had a CT scan performed 07/27/2022 that showed coronary calcification.  This led to a coronary calcium score 10/02/2022 that was 231 with calcium in all 3 coronary arteries.  He is completely asymptomatic.  Based on this we have been more aggressive with risk factor modification.

## 2023-08-23 NOTE — Assessment & Plan Note (Signed)
History of hyperlipidemia on high-dose statin therapy.  Will recheck a lipid liver profile.

## 2024-05-10 ENCOUNTER — Telehealth: Payer: Self-pay

## 2024-05-10 NOTE — Telephone Encounter (Signed)
 Spoke with patient he stated he was out of town for emergency and wouldn't be able to make appointment on 11/6... sent to P[employer] (LPN) to have her send to scheduling to have patient rescheduled.

## 2024-05-11 ENCOUNTER — Inpatient Hospital Stay: Payer: Medicare Other | Admitting: Hematology and Oncology

## 2024-06-06 ENCOUNTER — Inpatient Hospital Stay

## 2024-06-06 ENCOUNTER — Inpatient Hospital Stay: Attending: Hematology and Oncology | Admitting: Hematology and Oncology

## 2024-06-06 VITALS — BP 189/102 | HR 93 | Temp 97.3°F | Resp 19 | Wt 191.4 lb

## 2024-06-06 DIAGNOSIS — I10 Essential (primary) hypertension: Secondary | ICD-10-CM | POA: Diagnosis not present

## 2024-06-06 DIAGNOSIS — Z8042 Family history of malignant neoplasm of prostate: Secondary | ICD-10-CM | POA: Diagnosis not present

## 2024-06-06 DIAGNOSIS — K117 Disturbances of salivary secretion: Secondary | ICD-10-CM | POA: Diagnosis not present

## 2024-06-06 DIAGNOSIS — C113 Malignant neoplasm of anterior wall of nasopharynx: Secondary | ICD-10-CM

## 2024-06-06 DIAGNOSIS — Z9221 Personal history of antineoplastic chemotherapy: Secondary | ICD-10-CM | POA: Diagnosis not present

## 2024-06-06 DIAGNOSIS — R682 Dry mouth, unspecified: Secondary | ICD-10-CM | POA: Insufficient documentation

## 2024-06-06 DIAGNOSIS — Z08 Encounter for follow-up examination after completed treatment for malignant neoplasm: Secondary | ICD-10-CM | POA: Diagnosis present

## 2024-06-06 DIAGNOSIS — R252 Cramp and spasm: Secondary | ICD-10-CM | POA: Diagnosis not present

## 2024-06-06 DIAGNOSIS — Z8522 Personal history of malignant neoplasm of nasal cavities, middle ear, and accessory sinuses: Secondary | ICD-10-CM | POA: Insufficient documentation

## 2024-06-06 DIAGNOSIS — Z923 Personal history of irradiation: Secondary | ICD-10-CM | POA: Insufficient documentation

## 2024-06-06 LAB — CMP (CANCER CENTER ONLY)
ALT: 27 U/L (ref 0–44)
AST: 33 U/L (ref 15–41)
Albumin: 4.7 g/dL (ref 3.5–5.0)
Alkaline Phosphatase: 88 U/L (ref 38–126)
Anion gap: 8 (ref 5–15)
BUN: 18 mg/dL (ref 8–23)
CO2: 29 mmol/L (ref 22–32)
Calcium: 9.4 mg/dL (ref 8.9–10.3)
Chloride: 102 mmol/L (ref 98–111)
Creatinine: 1.41 mg/dL — ABNORMAL HIGH (ref 0.61–1.24)
GFR, Estimated: 55 mL/min — ABNORMAL LOW (ref >60)
Glucose, Bld: 123 mg/dL — ABNORMAL HIGH (ref 70–99)
Potassium: 3.5 mmol/L (ref 3.5–5.1)
Sodium: 139 mmol/L (ref 135–145)
Total Bilirubin: 0.5 mg/dL (ref 0.0–1.2)
Total Protein: 8 g/dL (ref 6.5–8.1)

## 2024-06-06 LAB — CBC WITH DIFFERENTIAL/PLATELET
Abs Immature Granulocytes: 0 K/uL (ref 0.00–0.07)
Basophils Absolute: 0 K/uL (ref 0.0–0.1)
Basophils Relative: 0 %
Eosinophils Absolute: 0 K/uL (ref 0.0–0.5)
Eosinophils Relative: 0 %
HCT: 43.7 % (ref 39.0–52.0)
Hemoglobin: 14.9 g/dL (ref 13.0–17.0)
Immature Granulocytes: 0 %
Lymphocytes Relative: 22 %
Lymphs Abs: 0.7 K/uL (ref 0.7–4.0)
MCH: 30.7 pg (ref 26.0–34.0)
MCHC: 34.1 g/dL (ref 30.0–36.0)
MCV: 90.1 fL (ref 80.0–100.0)
Monocytes Absolute: 0.3 K/uL (ref 0.1–1.0)
Monocytes Relative: 8 %
Neutro Abs: 2.2 K/uL (ref 1.7–7.7)
Neutrophils Relative %: 70 %
Platelets: 217 K/uL (ref 150–400)
RBC: 4.85 MIL/uL (ref 4.22–5.81)
RDW: 14.4 % (ref 11.5–15.5)
WBC: 3.2 K/uL — ABNORMAL LOW (ref 4.0–10.5)
nRBC: 0 % (ref 0.0–0.2)

## 2024-06-06 LAB — TSH: TSH: 2.65 u[IU]/mL (ref 0.350–4.500)

## 2024-06-06 NOTE — Progress Notes (Unsigned)
  Cancer Center FOLLOW-UP progress notes  Patient Care Team: Shelda Atlas, MD as PCP - General (Internal Medicine) Shelda Atlas, MD (Internal Medicine) Malmfelt, [name], RN as Oncology Nurse Navigator Izell Domino, MD as Consulting Physician (Radiation Oncology) Mable Lenis, MD (Inactive) as Consulting Physician (Otolaryngology) Loretha Ash, MD as Consulting Physician (Hematology and Oncology) Court Dorn PARAS, MD as Consulting Physician (Cardiology)  CHIEF COMPLAINTS/PURPOSE OF VISIT:  Nasopharyngeal cancer, status post chemoradiation therapy  I reviewed the patient's records extensive and collaborated the history with the patient. Summary of his history is as follows: Oncology History  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HCC)  08/06/2020 Initial Diagnosis   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HCC)   08/06/2020 Initial Diagnosis   Alan Henry presented with six-month history of gradually enlarging bilateral lymph nodes with associated mild discomfort and pressure.   08/07/2020 Imaging   He had CT soft tissue neck done on August 07, 2020 which showed nasopharyngeal soft tissue prominence, greatest soft tissue effacement of adjacent right parapharyngeal fat suspected to be at least enlarged right retropharyngeal lymph node.  Bulky bilateral cervical midline likely right intraparotid lymphadenopathy   08/30/2020 Pathology Results   Biopsy of right neck lymph node on 08/30/2020 revealed: squamous cell carcinoma, p16 positive. EBV ordered, negative.   09/27/2020 PET scan   1. There is intense FDG uptake within the area of increased soft tissue fullness in the posterior nasopharynx. Cannot exclude primary nasopharyngeal neoplasm. 2. Extensive, bulky bilateral FDG avid cervical adenopathy. Large FDG avid lymph node is also identified within the right parotid gland. Imaging findings compatible with metastatic adenopathy. 3. Subcentimeter right  supraclavicular lymph node exhibits mild FDG uptake above background activity. Equivocal for nodal metastasis. No additional signs of thoracic, abdominal, or pelvic metastasis. No evidence for osseous metastatic disease.    10/04/2020 Cancer Staging   Staging form: Pharynx - Nasopharynx, AJCC 8th Edition - Clinical stage from 10/04/2020: Stage IVA (cT1, cN3, cM0) - Signed by Izell Domino, MD on 10/04/2020 Stage prefix: Initial diagnosis   10/11/2020 - 12/13/2020 Chemotherapy   The patient received weekly cisplatin  with radiation   10/24/2020 - 12/12/2020 Radiation Therapy   Radiation Treatment Dates: 10/24/2020 through 12/12/2020 Site Technique Total Dose (Gy) Dose per Fx (Gy) Completed Fx Beam Energies  Neck: HN_NasoP IMRT 70/70 2 35/35 6X     03/18/2021 PET scan   Marked interval response to therapy. Near complete resolution of nodal enlargement seen on the previous study and no substantial FDG uptake in the anterior neck.    New area of hypermetabolic activity corresponds to an area in the RIGHT occipital region which shows predominantly fatty density. Given the FDG uptake associated with this location would suggest focused ultrasound with biopsy as warranted for further evaluation. Given the intense nature of the uptake, metastatic disease in this area is strongly considered, CT imaging findings however are atypical for disease.   Small amount of gas within the central parotid gland could even be within the parotid duct. Correlate with any symptoms on this side. No surrounding stranding.   Aortic Atherosclerosis (ICD10-I70.0).     05/01/2021 Procedure   Successful 20 French gastrostomy removal    Interval History  HISTORY OF PRESENTING ILLNESS:   Alan DELENA Henry Sr. 68 y.o. male is here for a follow up.  Discussed the use of AI scribe software for clinical note transcription with the patient, who gave verbal consent to proceed.  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 His main complaint today is dry mouth and neck spasms, reports that his symptoms have been stable. He has been managing his dry mouth by staying hydrated. However, he has been experiencing spasms in his neck for the past month. The spasms occur mostly on one side and are triggered by bending over. The spasms resolve after standing still for about 15-20 seconds. He denies any changes in his routine or exercises that could have triggered these spasms. 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 He also travels between [place]  and his current location regularly. He has not noticed any changes in his diet or eating habits. He continues to manage his dry mouth by drinking water regularly. He also reports some lymphedema, which he manages with regular massages. He has been using saline in his nostrils daily to manage blockage, especially during the change of seasons.     Rest of the pertinent 10 point ROS reviewed and neg.  MEDICAL HISTORY:  Past Medical History:  Diagnosis Date  . Chest pain    2021  . Chronic kidney disease   . History of kidney stones   . Hyperlipidemia   . Hypertension   . Nasopharyngeal cancer (HCC)   . Pneumonia   . Sleep apnea     SURGICAL HISTORY: Past Surgical History:  Procedure Laterality Date  . FINE NEEDLE ASPIRATION BIOPSY    . HERNIA REPAIR     Umbilicatl hernia  . IR GASTROSTOMY TUBE REMOVAL  05/01/2021  . IR IMAGING GUIDED PORT INSERTION  10/18/2020  . IR REMOVAL TUN ACCESS W/ PORT W/O FL MOD SED  05/01/2021  . LAPAROSCOPIC INSERTION GASTROSTOMY TUBE N/A 10/23/2020   Procedure: LAPAROSCOPIC ASSISTED PEG TUBE;  Surgeon: Dasie Leonor CROME, MD;  Location: [facility];  Service: General;  Laterality: N/A;  62  . ROTATOR CUFF REPAIR    . Torn Labrum      SOCIAL HISTORY: Social History   Socioeconomic History  . Marital status: Married    Spouse name: Not on file  . Number of children: Not on file  .  Years of education: Not on file  . Highest education level: Not on file  Occupational History  . Not on file  Tobacco Use  . Smoking status: Never  . Smokeless tobacco: Never  Vaping Use  . Vaping status: Never Used  Substance and Sexual Activity  . Alcohol use: Not Currently    Comment: very rarely  . Drug use: No  . Sexual activity: Not Currently  Other Topics Concern  . Not on file  Social History Narrative  . Not on file   Social Drivers of Health   Financial Resource Strain: Not on file  Food Insecurity: Low Risk  (08/05/2023)   Received from [facility]   Hunger Vital Sign   . Within the past 12 months, you worried that your food would run out before you got money to buy more: Never true   . Within the past 12 months, the food you bought just didn't last and you didn't have money to get more. : Never true  Transportation Needs: No Transportation Needs (08/05/2023)   Received from [facility]   . In the past 12 months, has lack of reliable transportation kept you from medical appointments, meetings, work or from getting things needed for daily living? : No  Physical Activity: Not on file  Stress: No Stress Concern Present (10/15/2020)   [employer] of Occupational Health - Occupational Stress Questionnaire   . Feeling of Stress : Not at all  Social Connections: Socially Integrated (10/15/2020)   Social Connection and Isolation Panel   . Frequency of Communication with Friends and Family: More than three times a week   . Frequency of Social Gatherings with Friends and Family: Twice a week   . Attends Religious Services: More than 4 times per year   . Active Member of Clubs or Organizations: Yes   . Attends Banker Meetings: 1 to 4 times per year   . Marital Status: Married  Catering Manager Violence: Not on file    FAMILY HISTORY: Family History  Problem Relation Age of Onset  . Prostate cancer Brother   . Colon cancer Neg Hx    . Colon polyps Neg Hx   . Esophageal cancer Neg Hx   . Rectal cancer Neg Hx   . Stomach cancer Neg Hx     ALLERGIES:  is allergic to osmolite [alitraq].  MEDICATIONS:  Current Outpatient Medications  Medication Sig Dispense Refill  . amLODIPine -Valsartan -HCTZ 10-320-25 MG TABS Take 1 tablet by mouth daily. 90 tablet 1  . atorvastatin  (LIPITOR ) 80 MG tablet Take 80 mg by mouth daily.    . cetirizine (ZYRTEC) 10 MG tablet Take 10 mg by mouth daily.    . ergocalciferol (VITAMIN D2) 1.25 MG (50000 UT) capsule Take 50,000 Units by mouth once a week. Wednesday    . fluticasone (FLONASE) 50 MCG/ACT nasal spray Place 2 sprays into both nostrils as needed.    SABRA omeprazole (PRILOSEC) 20 MG capsule Take 20 mg by mouth daily.    . sildenafil (VIAGRA) 100 MG tablet Take 100 mg by mouth daily as needed.    . sodium fluoride  (SODIUM FLUORIDE  5000 PPM) 1.1 % GEL dental gel Take 1 application by mouth at bedtime. Place 1 drop into each tooth space and leave in mouth for 5 minutes at bedtime.  Do not rinse with water, eat or drink for at least 30 minutes after use. 120 mL 11   No current facility-administered medications for this visit.    REVIEW OF SYSTEMS:   Constitutional: Denies fevers, chills or abnormal night sweats Eyes: Denies blurriness of vision, double vision or watery eyes Ears, nose, mouth, throat, and face: Denies mucositis or sore throat Respiratory: Denies cough, dyspnea or wheezes Cardiovascular: Denies palpitation, chest discomfort or lower extremity swelling Gastrointestinal:  Denies nausea, heartburn or change in bowel habits Skin: Denies abnormal skin rashes Lymphatics: Denies new lymphadenopathy or easy bruising Neurological:Denies numbness, tingling or new weaknesses Behavioral/Psych: Mood is stable, no new changes  All other systems were reviewed with the patient and are negative.  PHYSICAL EXAMINATION: ECOG PERFORMANCE STATUS: 1 - Symptomatic but completely  ambulatory  There were no vitals filed for this visit.    There were no vitals filed for this visit.     Physical Exam Constitutional:      Appearance: Normal appearance.  Cardiovascular:     Rate and Rhythm: Normal rate and regular rhythm.     Pulses: Normal pulses.     Heart sounds: Normal heart sounds.  Pulmonary:     Effort: Pulmonary effort is normal.     Breath sounds: Normal breath sounds.  Musculoskeletal:        General: No swelling or tenderness.     Cervical back: Normal range of motion and neck supple. No  rigidity.  Lymphadenopathy:     Cervical: No cervical adenopathy.  Skin:    General: Skin is warm and dry.  Neurological:     General: No focal deficit present.     Mental Status: He is alert.  Psychiatric:        Mood and Affect: Mood normal.      LABORATORY DATA:  I have reviewed the data as listed Lab Results  Component Value Date   WBC 3.3 (L) 05/10/2023   HGB 15.7 05/10/2023   HCT 44.7 05/10/2023   MCV 93.1 05/10/2023   PLT 188 05/10/2023   No results for input(s): NA, K, CL, CO2, GLUCOSE, BUN, CREATININE, CALCIUM , GFRNONAA, GFRAA, PROT, ALBUMIN, AST, ALT, ALKPHOS, BILITOT, BILIDIR, IBILI in the last 8760 hours.   RADIOGRAPHIC STUDIES: I have personally reviewed the radiological images as listed and agreed with the findings in the report. No results found.  ASSESSMENT & PLAN:   No problem-specific Assessment & Plan notes found for this encounter.     No orders of the defined types were placed in this encounter.   All questions were answered. The patient knows to call the clinic with any problems, questions or concerns. Total time spent: 30 minutes including history, physical exam, review of records, counseling and coordination of care Amber Stalls, MD 06/06/2024 2:34 PM

## 2025-06-06 ENCOUNTER — Inpatient Hospital Stay: Admitting: Hematology and Oncology

## 2025-06-06 ENCOUNTER — Inpatient Hospital Stay
